# Patient Record
Sex: Male | Born: 1970 | Race: Black or African American | Hispanic: No | Marital: Married | Smoking: Never smoker
Health system: Southern US, Community
[De-identification: ages and names within clinical notes are randomized; demographics above are authoritative.]

## PROBLEM LIST (undated history)

## (undated) DIAGNOSIS — S46119A Strain of muscle, fascia and tendon of long head of biceps, unspecified arm, initial encounter: Secondary | ICD-10-CM

## (undated) DIAGNOSIS — R011 Cardiac murmur, unspecified: Secondary | ICD-10-CM

## (undated) DIAGNOSIS — J45909 Unspecified asthma, uncomplicated: Secondary | ICD-10-CM

## (undated) DIAGNOSIS — M1990 Unspecified osteoarthritis, unspecified site: Secondary | ICD-10-CM

## (undated) HISTORY — DX: Unspecified asthma, uncomplicated: J45.909

## (undated) HISTORY — PX: ROTATOR CUFF REPAIR: SHX139

## (undated) HISTORY — PX: ORIF FINGER / THUMB FRACTURE: SUR932

## (undated) HISTORY — PX: RHINOPLASTY: SUR1284

## (undated) HISTORY — PX: HERNIA REPAIR: SHX51

## (undated) HISTORY — PX: LEG SURGERY: SHX1003

## (undated) HISTORY — PX: OTHER SURGICAL HISTORY: SHX169

## (undated) HISTORY — DX: Unspecified osteoarthritis, unspecified site: M19.90

## (undated) HISTORY — DX: Cardiac murmur, unspecified: R01.1

---

## 2017-10-24 ENCOUNTER — Ambulatory Visit: Payer: Self-pay | Admitting: Family Medicine

## 2017-11-28 ENCOUNTER — Ambulatory Visit: Payer: Self-pay | Admitting: Family Medicine

## 2017-11-28 ENCOUNTER — Ambulatory Visit (INDEPENDENT_AMBULATORY_CARE_PROVIDER_SITE_OTHER): Payer: Commercial Managed Care - PPO | Admitting: Family Medicine

## 2017-11-28 ENCOUNTER — Encounter: Payer: Self-pay | Admitting: Family Medicine

## 2017-11-28 VITALS — BP 120/78 | HR 68 | Temp 98.1°F | Ht 72.0 in | Wt 179.8 lb

## 2017-11-28 DIAGNOSIS — Z13220 Encounter for screening for lipoid disorders: Secondary | ICD-10-CM | POA: Diagnosis not present

## 2017-11-28 DIAGNOSIS — Z8639 Personal history of other endocrine, nutritional and metabolic disease: Secondary | ICD-10-CM | POA: Diagnosis not present

## 2017-11-28 DIAGNOSIS — Z0000 Encounter for general adult medical examination without abnormal findings: Secondary | ICD-10-CM | POA: Diagnosis not present

## 2017-11-28 DIAGNOSIS — Z125 Encounter for screening for malignant neoplasm of prostate: Secondary | ICD-10-CM | POA: Insufficient documentation

## 2017-11-28 MED ORDER — ALBUTEROL SULFATE HFA 108 (90 BASE) MCG/ACT IN AERS: 2.0000 | INHALATION_SPRAY | Freq: Four times a day (QID) | RESPIRATORY_TRACT | 2 refills | Status: DC | PRN

## 2017-11-28 NOTE — Patient Instructions (Signed)

## 2017-11-28 NOTE — Assessment & Plan Note (Signed)
Well adult Orders Placed This Encounter  Procedures  . Comp Met (CMET)    Standing Status:   Future    Standing Expiration Date:   11/29/2018  . CBC    Standing Status:   Future    Standing Expiration Date:   11/29/2018  . TSH    Standing Status:   Future    Standing Expiration Date:   11/29/2018  . Lipid Profile    Standing Status:   Future    Standing Expiration Date:   11/29/2018  . Testosterone Total,Free,Bio, Males    Standing Status:   Future    Standing Expiration Date:   11/29/2018  Immunizations:  Reports up to date on tetanus Screenings:  Reports up to date on HIV screening.  Lipid panel ordered Anticipatory guidance/Risk factor reduction:  Per AVS

## 2017-11-28 NOTE — Progress Notes (Signed)
Darin Hart - 47 y.o. male MRN [id number]  Date of birth: [birth year]  Subjective Chief Complaint  Patient presents with  . Establish Care    CPE,has eaten breakfast at 730, not fasting    HPI Darin Hart is a 47 y.o. male here today to establish care and for annual CPE.  He recently moved from [place].  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  Also participates in competitive bodybuilding.  Taking numerous supplements including supplement for glucose control (beberine, chromium and cinnamon) as well as a testosterone booster and estrogen blocker.  He denies any current or prior use of anabolic steroids.  He would like to have testosterone levels checked as they have been a little low in the past.  He is not fasting for labs today.    He does also have a history of asthma and needs a refill of albuterol inhaler.  He does not need to use this too often.   Review of Systems  Constitutional: Negative for chills, fever, malaise/fatigue and weight loss.  HENT: Negative for congestion, ear pain and sore throat.   Eyes: Negative for blurred vision, double vision and pain.  Respiratory: Negative for cough and shortness of breath.   Cardiovascular: Negative for chest pain and palpitations.  Gastrointestinal: Negative for abdominal pain, blood in stool, constipation, heartburn and nausea.  Genitourinary: Negative for dysuria and urgency.  Musculoskeletal: Negative for joint pain and myalgias.  Neurological: Negative for dizziness and headaches.  Endo/Heme/Allergies: Does not bruise/bleed easily.  Psychiatric/Behavioral: Negative for depression. The patient is not nervous/anxious and does not have insomnia.     Allergies  Allergen Reactions  . Other     All Tree Nuts  . Penicillins   . Shellfish Allergy     Past Medical History:  Diagnosis Date  . Arthritis   . Asthma   . Heart murmur     History reviewed. No pertinent surgical history.  Social History    Socioeconomic History  . Marital status: Married    Spouse name: Not on file  . Number of children: Not on file  . Years of education: Not on file  . Highest education level: Not on file  Occupational History  . Not on file  Social Needs  . Financial resource strain: Not on file  . Food insecurity:    Worry: Not on file    Inability: Not on file  . Transportation needs:    Medical: Not on file    Non-medical: Not on file  Tobacco Use  . Smoking status: Never Smoker  . Smokeless tobacco: Never Used  Substance and Sexual Activity  . Alcohol use: Not on file  . Drug use: Not on file  . Sexual activity: Not on file  Lifestyle  . Physical activity:    Days per week: Not on file    Minutes per session: Not on file  . Stress: Not on file  Relationships  . Social connections:    Talks on phone: Not on file    Gets together: Not on file    Attends religious service: Not on file    Active member of club or organization: Not on file    Attends meetings of clubs or organizations: Not on file    Relationship status: Not on file  Other Topics Concern  . Not on file  Social History Narrative  . Not on file    History reviewed. No pertinent family history.  Health Maintenance  Topic Date Due  . HIV Screening  09/21/1985  . TETANUS/TDAP  09/21/1989  . INFLUENZA VACCINE  12/22/2017    ----------------------------------------------------------------------------------------------------------------------------------------------------------------------------------------------------------------- Physical Exam BP 120/78 (BP Location: Left Arm, Patient Position: Sitting, Cuff Size: Large)   Pulse 68   Temp 98.1 F (36.7 C) (Oral)   Ht 6' (1.829 m)   Wt 179 lb 12.8 oz (81.6 kg)   SpO2 96%   BMI 24.39 kg/m   Physical Exam  Constitutional: He is oriented to person, place, and time. He appears well-nourished. No distress.  HENT:  Head: Normocephalic and atraumatic.   Mouth/Throat: Oropharynx is clear and moist.  Eyes: No scleral icterus.  Neck: Neck supple. No thyromegaly present.  Cardiovascular: Normal rate, regular rhythm, normal heart sounds and intact distal pulses.  Pulmonary/Chest: Effort normal and breath sounds normal.  Abdominal: Soft. He exhibits no distension. There is no tenderness.  Musculoskeletal: He exhibits no tenderness.  Lymphadenopathy:    He has no cervical adenopathy.  Neurological: He is alert and oriented to person, place, and time.  Skin: Skin is warm and dry. No rash noted.  Psychiatric: He has a normal mood and affect. His behavior is normal.    ------------------------------------------------------------------------------------------------------------------------------------------------------------------------------------------------------------------- Assessment and Plan  Well adult exam Well adult Orders Placed This Encounter  Procedures  . Comp Met (CMET)    Standing Status:   Future    Standing Expiration Date:   11/29/2018  . CBC    Standing Status:   Future    Standing Expiration Date:   11/29/2018  . TSH    Standing Status:   Future    Standing Expiration Date:   11/29/2018  . Lipid Profile    Standing Status:   Future    Standing Expiration Date:   11/29/2018  . Testosterone Total,Free,Bio, Males    Standing Status:   Future    Standing Expiration Date:   11/29/2018  Immunizations:  Reports up to date on tetanus Screenings:  Reports up to date on HIV screening.  Lipid panel ordered Anticipatory guidance/Risk factor reduction:  Per AVS

## 2017-11-29 ENCOUNTER — Other Ambulatory Visit (INDEPENDENT_AMBULATORY_CARE_PROVIDER_SITE_OTHER): Payer: Commercial Managed Care - PPO

## 2017-11-29 DIAGNOSIS — Z13220 Encounter for screening for lipoid disorders: Secondary | ICD-10-CM | POA: Diagnosis not present

## 2017-11-29 DIAGNOSIS — Z8639 Personal history of other endocrine, nutritional and metabolic disease: Secondary | ICD-10-CM

## 2017-11-29 DIAGNOSIS — Z0000 Encounter for general adult medical examination without abnormal findings: Secondary | ICD-10-CM

## 2017-11-29 LAB — COMPREHENSIVE METABOLIC PANEL
ALK PHOS: 44 U/L (ref 39–117)
ALT: 27 U/L (ref 0–53)
AST: 30 U/L (ref 0–37)
Albumin: 4.2 g/dL (ref 3.5–5.2)
BUN: 26 mg/dL — ABNORMAL HIGH (ref 6–23)
CALCIUM: 9.2 mg/dL (ref 8.4–10.5)
CO2: 30 meq/L (ref 19–32)
Chloride: 105 mEq/L (ref 96–112)
Creatinine, Ser: 1.35 mg/dL (ref 0.40–1.50)
GFR: 72.8 mL/min (ref >60.00)
Glucose, Bld: 92 mg/dL (ref 70–99)
Potassium: 4 mEq/L (ref 3.5–5.1)
Sodium: 140 mEq/L (ref 135–145)
TOTAL PROTEIN: 6.9 g/dL (ref 6.0–8.3)
Total Bilirubin: 0.5 mg/dL (ref 0.2–1.2)

## 2017-11-29 LAB — CBC
HCT: 41.4 % (ref 39.0–52.0)
HEMOGLOBIN: 13.9 g/dL (ref 13.0–17.0)
MCHC: 33.5 g/dL (ref 30.0–36.0)
MCV: 91.8 fl (ref 78.0–100.0)
Platelets: 182 10*3/uL (ref 150.0–400.0)
RBC: 4.51 Mil/uL (ref 4.22–5.81)
RDW: 13.3 % (ref 11.5–15.5)
WBC: 5 10*3/uL (ref 4.0–10.5)

## 2017-11-29 LAB — LIPID PANEL
Cholesterol: 132 mg/dL (ref 0–200)
HDL: 49.1 mg/dL (ref >39.00)
LDL Cholesterol: 73 mg/dL (ref 0–99)
NONHDL: 83.1
TRIGLYCERIDES: 50 mg/dL (ref 0.0–149.0)
Total CHOL/HDL Ratio: 3
VLDL: 10 mg/dL (ref 0.0–40.0)

## 2017-11-29 LAB — TSH: TSH: 2.51 u[IU]/mL (ref 0.35–4.50)

## 2017-11-30 LAB — TESTOSTERONE TOTAL,FREE,BIO, MALES
ALBUMIN MSPROF: 4.3 g/dL (ref 3.6–5.1)
SEX HORMONE BINDING: 16 nmol/L (ref 10–50)
Testosterone: 240 ng/dL — ABNORMAL LOW (ref 250–827)

## 2017-12-02 ENCOUNTER — Other Ambulatory Visit: Payer: Self-pay | Admitting: Family Medicine

## 2017-12-02 DIAGNOSIS — E291 Testicular hypofunction: Secondary | ICD-10-CM

## 2017-12-08 ENCOUNTER — Encounter (HOSPITAL_COMMUNITY): Payer: Self-pay | Admitting: Emergency Medicine

## 2017-12-08 ENCOUNTER — Emergency Department (HOSPITAL_COMMUNITY)
Admission: EM | Admit: 2017-12-08 | Discharge: 2017-12-08 | Disposition: A | Discharge status: Home / Self Care | Payer: Commercial Managed Care - PPO | Attending: Emergency Medicine | Admitting: Emergency Medicine

## 2017-12-08 ENCOUNTER — Emergency Department (HOSPITAL_COMMUNITY): Payer: Commercial Managed Care - PPO

## 2017-12-08 DIAGNOSIS — M5386 Other specified dorsopathies, lumbar region: Secondary | ICD-10-CM

## 2017-12-08 DIAGNOSIS — I10 Essential (primary) hypertension: Secondary | ICD-10-CM | POA: Diagnosis not present

## 2017-12-08 DIAGNOSIS — J45909 Unspecified asthma, uncomplicated: Secondary | ICD-10-CM | POA: Diagnosis not present

## 2017-12-08 DIAGNOSIS — Z79899 Other long term (current) drug therapy: Secondary | ICD-10-CM | POA: Insufficient documentation

## 2017-12-08 DIAGNOSIS — M5430 Sciatica, unspecified side: Secondary | ICD-10-CM | POA: Diagnosis not present

## 2017-12-08 DIAGNOSIS — M545 Low back pain: Secondary | ICD-10-CM | POA: Diagnosis present

## 2017-12-08 DIAGNOSIS — M5489 Other dorsalgia: Secondary | ICD-10-CM | POA: Diagnosis not present

## 2017-12-08 DIAGNOSIS — M5135 Other intervertebral disc degeneration, thoracolumbar region: Secondary | ICD-10-CM | POA: Diagnosis not present

## 2017-12-08 DIAGNOSIS — R531 Weakness: Secondary | ICD-10-CM | POA: Diagnosis not present

## 2017-12-08 DIAGNOSIS — M5442 Lumbago with sciatica, left side: Secondary | ICD-10-CM | POA: Diagnosis not present

## 2017-12-08 MED ORDER — DIAZEPAM 5 MG/ML IJ SOLN
5.0000 mg | Freq: Once | INTRAMUSCULAR | Status: AC
  Administered 2017-12-08: 5 mg via INTRAVENOUS
  Filled 2017-12-08: qty 2

## 2017-12-08 MED ORDER — KETOROLAC TROMETHAMINE 30 MG/ML IJ SOLN
30.0000 mg | Freq: Once | INTRAMUSCULAR | Status: AC
  Administered 2017-12-08: 30 mg via INTRAVENOUS
  Filled 2017-12-08: qty 1

## 2017-12-08 MED ORDER — PREDNISONE 10 MG (21) PO TBPK: ORAL_TABLET | Freq: Every day | ORAL | 0 refills | Status: DC

## 2017-12-08 MED ORDER — PREDNISONE 20 MG PO TABS
60.0000 mg | ORAL_TABLET | Freq: Once | ORAL | Status: AC
  Administered 2017-12-08: 60 mg via ORAL
  Filled 2017-12-08: qty 3

## 2017-12-08 MED ORDER — HYDROMORPHONE HCL 1 MG/ML IJ SOLN
0.5000 mg | Freq: Once | INTRAMUSCULAR | Status: AC
  Administered 2017-12-08: 0.5 mg via INTRAVENOUS
  Filled 2017-12-08: qty 1

## 2017-12-08 MED ORDER — DIAZEPAM 5 MG PO TABS: ORAL_TABLET | ORAL | 0 refills | Status: DC

## 2017-12-08 NOTE — ED Provider Notes (Signed)
[facility] Note   CSN: [id number] Arrival date & time: 12/08/17  1026     History   Chief Complaint Chief Complaint  Patient presents with  . Back Pain    HPI Darin Hart is a 47 y.o. male history of pneumothorax and low back injury 6 years ago in [place] who presents to the emergency department by EMS with a chief complaint of back pain.  The patient endorses severe, constant midline low back pain that began yesterday while the patient was at the gym.  He was wearing a weight belt and went to place a 35 pound bar back on the shelf when the pain began suddenly.  He states " both of my legs went dead".  He reports that he was unable to feel either of his legs or walk for several minutes.  He eventually began to regain sensation in the right leg, but reports that the left leg felt numb until late last night.  He was seen by neurosurgery and [place] approximately 6 years ago for a similar injury.  He reports that he had several MRIs performed, but they ultimately chose to manage the patient nonoperatively.  He is unsure of his diagnosis at that time.  Pain is worse with standing or walking, which the patient has barely been able to do since the injury.  Pain was minimally improved with laying on his side.  He denies a fever, chills, urinary or fecal incontinence, dysuria, hematuria, penile or testicular pain or swelling, abdominal pain, chest pain, or dyspnea.  He treated his symptoms with 800 mg of ibuprofen last night and slept with a blanket between his legs on his side.  No improvement with treatment.   Yesterday at the gym putting a barbell back on a bench.35 lbs wears a weight belt legs would numb couldn't stand or lift legs for some time left leg stayed "dead" until yesterday evening. Simliar 6 years ago when he was living in [place].  The history is provided by the patient. No language interpreter was used.    Past Medical  History:  Diagnosis Date  . Arthritis   . Asthma   . Heart murmur     Patient Active Problem List   Diagnosis Date Noted  . Well adult exam 11/28/2017    History reviewed. No pertinent surgical history.      Home Medications    Prior to Admission medications   Medication Sig Start Date End Date Taking? Authorizing Provider  ibuprofen (ADVIL,MOTRIN) 200 MG tablet Take 200 mg by mouth every 6 (six) hours as needed for moderate pain.   Yes [provider]  Multiple Vitamin (MULTIVITAMIN WITH MINERALS) TABS tablet Take 1 tablet by mouth daily.   Yes [provider]  Omega-3 Fatty Acids (FISH OIL BURP-LESS PO) Take 2 capsules by mouth daily.   Yes [provider]  albuterol (PROVENTIL HFA;VENTOLIN HFA) 108 (90 Base) MCG/ACT inhaler Inhale 2 puffs into the lungs every 6 (six) hours as needed for wheezing or shortness of breath. 11/28/17   Everrett CoombeMatthews, Cody, DO  diazepam (VALIUM) 5 MG tablet Take 1 to 2 tablets 3 or 4 times daily 12/08/17   Trason Shifflet A, PA-C  predniSONE (STERAPRED UNI-PAK 21 TAB) 10 MG (21) TBPK tablet Take by mouth daily. Take 6 tabs by mouth daily  for 2 days, then 5 tabs for 2 days, then 4 tabs for 2 days, then 3 tabs for 2 days,  2 tabs for 2 days, then 1 tab by mouth daily for 2 days 12/08/17   Barkley Boards, PA-C    Family History No family history on file.  Social History Social History   Tobacco Use  . Smoking status: Never Smoker  . Smokeless tobacco: Never Used  Substance Use Topics  . Alcohol use: Not on file  . Drug use: Not on file     Allergies   Shellfish allergy; Other; and Penicillins   Review of Systems Review of Systems  Constitutional: Negative for appetite change, chills and fever.  Respiratory: Negative for shortness of breath.   Cardiovascular: Negative for chest pain.  Gastrointestinal: Negative for abdominal pain, diarrhea, nausea and vomiting.  Genitourinary: Negative for dysuria, flank pain, frequency,  hematuria and urgency.  Musculoskeletal: Positive for arthralgias, back pain, gait problem and myalgias. Negative for joint swelling.  Skin: Negative for rash.  Allergic/Immunologic: Negative for immunocompromised state.  Neurological: Positive for weakness and numbness. Negative for headaches.  Psychiatric/Behavioral: Negative for confusion.     Physical Exam Updated Vital Signs BP (!) 139/95 (BP Location: Left Arm)   Pulse 71   Temp 98.1 F (36.7 C) (Oral)   Resp 16   SpO2 98%   Physical Exam  Constitutional: He appears well-developed.  HENT:  Head: Normocephalic.  Eyes: Conjunctivae are normal.  Neck: Neck supple.  Cardiovascular: Normal rate, regular rhythm, normal heart sounds and intact distal pulses. Exam reveals no gallop and no friction rub.  No murmur heard. Pulmonary/Chest: Effort normal and breath sounds normal. No stridor. No respiratory distress. He has no wheezes. He has no rales. He exhibits no tenderness.  Abdominal: Soft. Bowel sounds are normal. He exhibits no distension and no mass. There is no tenderness. There is no rebound and no guarding. No hernia.  Musculoskeletal: He exhibits tenderness. He exhibits no edema or deformity.  No tenderness to the cervical or thoracic spinous processes or bilateral paraspinal muscles.  He is tender to palpation to the spinous processes of the lumbar spine and left-sided paraspinal muscles.  No palpable muscle spasms.  Sensation is slightly decreased on the left as compared to the right.  5 out of 5 strength against resistance of the bilateral lower extremities.  Ambulation weightbearing as deferred at this time as the patient looks extremely uncomfortable with positional changes.  Neurological: He is alert.  Skin: Skin is warm and dry.  Psychiatric: His behavior is normal.  Nursing note and vitals reviewed.    ED Treatments / Results  Labs (all labs ordered are listed, but only abnormal results are displayed) Labs  Reviewed - No data to display  EKG None  Radiology Dg Lumbar Spine Complete  Result Date: 12/08/2017 CLINICAL DATA:  Low back pain after lifting injury EXAM: LUMBAR SPINE - COMPLETE 4+ VIEW COMPARISON:  None. FINDINGS: There is no evidence of lumbar spine fracture. Alignment is normal. Intervertebral disc spaces are maintained. IMPRESSION: Negative. Electronically Signed   By: Charlett Nose M.D.   On: 12/08/2017 15:00    Procedures Procedures (including critical care time)  Medications Ordered in ED Medications  HYDROmorphone (DILAUDID) injection 0.5 mg (0.5 mg Intravenous Given 12/08/17 1259)  diazepam (VALIUM) injection 5 mg (5 mg Intravenous Given 12/08/17 1258)  predniSONE (DELTASONE) tablet 60 mg (60 mg Oral Given 12/08/17 1634)  ketorolac (TORADOL) 30 MG/ML injection 30 mg (30 mg Intravenous Given 12/08/17 1634)  diazepam (VALIUM) injection 5 mg (5 mg Intravenous Given 12/08/17 1635)  Initial Impression / Assessment and Plan / ED Course  I have reviewed the triage vital signs and the nursing notes.  Pertinent labs & imaging results that were available during my care of the patient were reviewed by me and considered in my medical decision making (see chart for details).  47 year old male with history of pneumothorax and previous low back injury presenting to the emergency department by EMS with a chief complaint of severe low back pain that radiates down the left leg.  No focal neurologic deficits on exam.  He appears extremely uncomfortable with positional changes.  0.5 mg of Dilaudid and 5 mg of Valium and given prior to x-ray of the lumbar spine.  X-ray of the lumbar spine with no acute fractures or other pathology.  Suspect sciatica as the pain radiates down the left leg.   Clinical Course as of Dec 08 1840  Thu Dec 08, 2017  1615 Patient reevaluation.  The patient is able to go from a sitting to a standing position with minimal assistance.  He still appears significantly  uncomfortable and very stiff.  Antalgic gait.  Will order first dose of prednisone, Valium, and Toradol and re-evaluate prior to discharge.   [MM]    Clinical Course User Index [MM] Vernie Piet A, PA-C    On reevaluation, the patient is feeling much better.  Will discharge the patient to home with prednisone taper, Valium for severe muscle spasms, and follow-up with neurosurgery.  Strict return precautions given.  He is hemodynamically stable and in no acute distress.  He is safe for discharge to home with outpatient follow-up at this time.  Final Clinical Impressions(s) / ED Diagnoses   Final diagnoses:  Sciatica associated with disorder of lumbar spine    ED Discharge Orders        Ordered    diazepam (VALIUM) 5 MG tablet     12/08/17 1716    predniSONE (STERAPRED UNI-PAK 21 TAB) 10 MG (21) TBPK tablet  Daily     12/08/17 1716       Frederik Pear A, PA-C 12/08/17 1842    Azalia Bilis, MD 12/09/17 [id number]

## 2017-12-08 NOTE — Discharge Instructions (Addendum)
Thank you for allowing me to care for you today in the Emergency Department.   Take 1 to 2 tablets of Valium 3-4 times daily.  This is a benzodiazepine and can make you drowsy so do not work or drive while taking this medication.  Starting tomorrow, Take 6 tabs of Prednisone by mouth daily  for 2 days, then 5 tabs for 2 days, then 4 tabs for 2 days, then 3 tabs for 2 days, 2 tabs for 2 days, then 1 tab by mouth daily for 2 days  You can also take 800 mg of ibuprofen with food or thousand milligrams of Tylenol every 8 hours for pain control.  You can also alternate between these 2 medications every 4 hours to help with pain and swelling.  Apply ice or heat for 15 to 20 minutes up to 3-4 times a day.  Start to stretch as your pain allows.  Return to the emergency department if you pee or poop on yourself, have new numbness or weakness, high fever, new fall or injury, or other new concerning symptoms.  You can follow-up with neurosurgery or primary care if your symptoms do not start to improve in the next 5 to 7 days.

## 2017-12-08 NOTE — ED Triage Notes (Signed)
Per [facility] pt from home for back pain that has been going on for over a day. Pt took ibuprofen 800mg  last night.

## 2017-12-12 ENCOUNTER — Other Ambulatory Visit: Payer: Commercial Managed Care - PPO

## 2017-12-13 ENCOUNTER — Other Ambulatory Visit: Payer: Self-pay

## 2017-12-13 ENCOUNTER — Inpatient Hospital Stay: Payer: Commercial Managed Care - PPO | Admitting: Family Medicine

## 2017-12-16 ENCOUNTER — Ambulatory Visit (INDEPENDENT_AMBULATORY_CARE_PROVIDER_SITE_OTHER): Payer: Commercial Managed Care - PPO | Admitting: Family Medicine

## 2017-12-16 ENCOUNTER — Encounter: Payer: Self-pay | Admitting: Family Medicine

## 2017-12-16 VITALS — BP 112/70 | HR 76 | Temp 97.9°F | Ht 72.0 in | Wt 181.4 lb

## 2017-12-16 DIAGNOSIS — E291 Testicular hypofunction: Secondary | ICD-10-CM

## 2017-12-16 DIAGNOSIS — M5442 Lumbago with sciatica, left side: Secondary | ICD-10-CM

## 2017-12-16 DIAGNOSIS — M5441 Lumbago with sciatica, right side: Secondary | ICD-10-CM

## 2017-12-16 LAB — LUTEINIZING HORMONE: LH: 8.51 m[IU]/mL (ref 1.50–9.30)

## 2017-12-16 LAB — FOLLICLE STIMULATING HORMONE: FSH: 7.1 m[IU]/mL (ref 1.4–18.1)

## 2017-12-16 LAB — TESTOSTERONE: TESTOSTERONE: 232.09 ng/dL — AB (ref 300.00–890.00)

## 2017-12-16 MED ORDER — CYCLOBENZAPRINE HCL 10 MG PO TABS: 10.0000 mg | ORAL_TABLET | Freq: Three times a day (TID) | ORAL | 0 refills | Status: DC | PRN

## 2017-12-16 NOTE — Progress Notes (Signed)
Darin LeschChristopher E Skarzynski - 47 y.o. male MRN [id number]  Date of birth: 01/10/1971  Subjective Chief Complaint  Patient presents with  . Follow-up    sore back and tight, trying to walk now that he can move.    HPI Darin Hart is a 47 y.o. male here today for follow up of ED visit.  Was seen on 12/08/2017 with complaint of low back pain with radiation into legs. Started while working out at [employer] gym.   Had weakness in legs associated with this as well that lasted several minutes but had improved by the time he reached the ED.  He has history of similar symptoms and was seen by Neurosurgery while living in [place] previously.  He had MRI completed around 6 years ago but decided to manage conservatively.  He is unsure what previous MRI showed.  He was prescribed valium and medrol dosepak.  He has not quite completed steroids and is almost out of valium.  Xray in ED was unremarkable.  He has regained some ROM although still feels stiff.  He denies continued weakness or numbness into his legs.  He is trying to walk more which seems to be helpful and is avoiding exercises that put strain on his lower back.    ROS:  A comprehensive ROS was completed and negative except as noted per HPI  Allergies  Allergen Reactions  . Shellfish Allergy Anaphylaxis  . Other Other (See Comments)    Migraines, itchy tongue, burning mouth All Tree Nuts  . Penicillins     Pt was told he could not take this, his reaction is unknown Has patient had a PCN reaction causing immediate rash, facial/tongue/throat swelling, SOB or lightheadedness with hypotension: Unknown Has patient had a PCN reaction causing severe rash involving mucus membranes or skin necrosis: Unknown Has patient had a PCN reaction that required hospitalization: NO Has patient had a PCN reaction occurring within the last 10 years: NO If all of the above answers are "NO", then may proceed with Cephalospo    Past Medical History:  Diagnosis Date  .  Arthritis   . Asthma   . Heart murmur     No past surgical history on file.  Social History   Socioeconomic History  . Marital status: Married    Spouse name: Not on file  . Number of children: Not on file  . Years of education: Not on file  . Highest education level: Not on file  Occupational History  . Not on file  Social Needs  . Financial resource strain: Not on file  . Food insecurity:    Worry: Not on file    Inability: Not on file  . Transportation needs:    Medical: Not on file    Non-medical: Not on file  Tobacco Use  . Smoking status: Never Smoker  . Smokeless tobacco: Never Used  Substance and Sexual Activity  . Alcohol use: Not on file  . Drug use: Not on file  . Sexual activity: Not on file  Lifestyle  . Physical activity:    Days per week: Not on file    Minutes per session: Not on file  . Stress: Not on file  Relationships  . Social connections:    Talks on phone: Not on file    Gets together: Not on file    Attends religious service: Not on file    Active member of club or organization: Not on file    Attends meetings  of clubs or organizations: Not on file    Relationship status: Not on file  Other Topics Concern  . Not on file  Social History Narrative  . Not on file    No family history on file.  Health Maintenance  Topic Date Due  . HIV Screening  09/21/1985  . TETANUS/TDAP  09/21/1989  . INFLUENZA VACCINE  12/22/2017    ----------------------------------------------------------------------------------------------------------------------------------------------------------------------------------------------------------------- Physical Exam BP 112/70 (BP Location: Left Arm, Patient Position: Sitting, Cuff Size: Large)   Pulse 76   Temp 97.9 F (36.6 C) (Oral)   Ht 6' (1.829 m)   Wt 181 lb 6.4 oz (82.3 kg)   SpO2 99%   BMI 24.60 kg/m   Physical Exam  Constitutional: He is oriented to person, place, and time. He appears  well-nourished. No distress.  HENT:  Head: Normocephalic and atraumatic.  Mouth/Throat: Oropharynx is clear and moist.  Eyes: No scleral icterus.  Cardiovascular: Normal rate, regular rhythm and normal heart sounds.  Pulmonary/Chest: Effort normal and breath sounds normal.  Musculoskeletal:  L spine without step off palpated.  ROM is fairly good although he is a bit stiff.  Mild bilateral paraspinal spasm on along lumbar area.  Strength and sensation are normal.   Neurological: He is alert and oriented to person, place, and time.  Skin: Skin is warm and dry.  Psychiatric: He has a normal mood and affect. His behavior is normal.    ------------------------------------------------------------------------------------------------------------------------------------------------------------------------------------------------------------------- Assessment and Plan  Acute bilateral low back pain with bilateral sciatica -Sciatica symptoms have improved, still with stiffness in lower back -Will transition from valium to flexeril for spasm, new rx sent in.  -Complete medrol dosepak -May use ibuprofen as needed  -Continue icing/heat -Limit any heavy lifting for now.   -If not continuing to improve over the next 2-3 weeks may need to repeat MRI.

## 2017-12-16 NOTE — Assessment & Plan Note (Signed)
-  Sciatica symptoms have improved, still with stiffness in lower back -Will transition from valium to flexeril for spasm, new rx sent in.  -Complete medrol dosepak -May use ibuprofen as needed  -Continue icing/heat -Limit any heavy lifting for now.   -If not continuing to improve over the next 2-3 weeks may need to repeat MRI.

## 2017-12-16 NOTE — Patient Instructions (Signed)

## 2017-12-19 ENCOUNTER — Other Ambulatory Visit: Payer: Commercial Managed Care - PPO

## 2017-12-20 ENCOUNTER — Other Ambulatory Visit: Payer: Self-pay | Admitting: Family Medicine

## 2017-12-20 DIAGNOSIS — E291 Testicular hypofunction: Secondary | ICD-10-CM

## 2017-12-21 ENCOUNTER — Telehealth: Payer: Self-pay | Admitting: Family Medicine

## 2017-12-21 NOTE — Telephone Encounter (Signed)
Copied from CRM [phone]#[id number]. Topic: General - Other >> Dec 21, 2017  1:27 PM Stephannie LiSimmons, Aqeel Norgaard L, NT wrote: Reason for CRM: Patient called and needs a  work note with that says  no limitations and his return to work date is 12/28/17, on Friday 12/23/17 his wife Shawnie Ponsngela Kinzler has an appointment that day and he would like for her to pick up the letter during her visit , please advise [phone]

## 2017-12-21 NOTE — Telephone Encounter (Signed)
Yes, please.  May return to work 12/28/17 with no restrictions.

## 2017-12-21 NOTE — Telephone Encounter (Signed)
I wrote letter and left up front for pickup. Tried to inform pt but phone was not accepting calls , will try again.

## 2017-12-21 NOTE — Telephone Encounter (Signed)
Please advise, would you like me to write up a work note?

## 2018-01-30 ENCOUNTER — Encounter: Payer: Self-pay | Admitting: Family Medicine

## 2018-01-30 ENCOUNTER — Ambulatory Visit (INDEPENDENT_AMBULATORY_CARE_PROVIDER_SITE_OTHER): Payer: Commercial Managed Care - PPO | Admitting: Family Medicine

## 2018-01-30 DIAGNOSIS — M25511 Pain in right shoulder: Secondary | ICD-10-CM | POA: Diagnosis not present

## 2018-01-30 NOTE — Patient Instructions (Signed)
Rest the shoulder for the next 48 hours, then you may begin light activity.   Rotator Cuff Tendinitis Rotator cuff tendinitis is inflammation of the tough, cord-like bands that connect muscle to bone (tendons) in the rotator cuff. The rotator cuff includes all of the muscles and tendons that connect the arm to the shoulder. The rotator cuff holds the head of the upper arm bone (humerus) in the cup (fossa) of the shoulder blade (scapula). This condition can lead to a long-lasting (chronic) tear. The tear may be partial or complete. What are the causes? This condition is usually caused by overusing the rotator cuff. What increases the risk? This condition is more likely to develop in athletes and workers who frequently use their shoulder or reach over their heads. This can include activities such as:  Tennis.  Baseball or softball.  Swimming.  Construction work.  Painting.  What are the signs or symptoms? Symptoms of this condition include:  Pain spreading (radiating) from the shoulder to the upper arm.  Swelling and tenderness in front of the shoulder.  Pain when reaching, pulling, or lifting the arm above the head.  Pain when lowering the arm from above the head.  Minor pain in the shoulder when resting.  Increased pain in the shoulder at night.  Difficulty placing the arm behind the back.  How is this diagnosed? This condition is diagnosed with a medical history and physical exam. Tests may also be done, including:  X-rays.  MRI.  Ultrasounds.  CT or MR arthrogram. During this test, a contrast material is injected and then images are taken.  How is this treated? Treatment for this condition depends on the severity of the condition. In less severe cases, treatment may include:  Rest. This may be done with a sling that holds the shoulder still (immobilization). Your health care provider may also recommend avoiding activities that involve lifting your arm over your  head.  Icing the shoulder.  Anti-inflammatory medicines, such as aspirin or ibuprofen.  In more severe cases, treatment may include:  Physical therapy.  Steroid injections.  Surgery.  Follow these instructions at home: If you have a sling:  Wear the sling as told by your health care provider. Remove it only as told by your health care provider.  Loosen the sling if your fingers tingle, become numb, or turn cold and blue.  Keep the sling clean.  If the sling is not waterproof, do not let it get wet. Remove it, if allowed, or cover it with a watertight covering when you take a bath or shower. Managing pain, stiffness, and swelling  If directed, put ice on the injured area. ? If you have a removable sling, remove it as told by your health care provider. ? Put ice in a plastic bag. ? Place a towel between your skin and the bag. ? Leave the ice on for 20 minutes, 2-3 times a day.  Move your fingers often to avoid stiffness and to lessen swelling.  Raise (elevate) the injured area above the level of your heart while you are lying down.  Find a comfortable sleeping position or sleep on a recliner, if available. Driving  Do not drive or use heavy machinery while taking prescription pain medicine.  Ask your health care provider when it is safe to drive if you have a sling on your arm. Activity  Rest your shoulder as told by your health care provider.  Return to your normal activities as told by your health  care provider. Ask your health care provider what activities are safe for you.  Do any exercises or stretches as told by your health care provider.  If you do repetitive overhead tasks, take small breaks in between and include stretching exercises as told by your health care provider. General instructions  Do not use any products that contain nicotine or tobacco, such as cigarettes and e-cigarettes. These can delay healing. If you need help quitting, ask your health care  provider.  Take over-the-counter and prescription medicines only as told by your health care provider.  Keep all follow-up visits as told by your health care provider. This is important. Contact a health care provider if:  Your pain gets worse.  You have new pain in your arm, hands, or fingers.  Your pain is not relieved with medicine or does not get better after 6 weeks of treatment.  You have cracking sensations when moving your shoulder in certain directions.  You hear a snapping sound after using your shoulder, followed by severe pain and weakness. Get help right away if:  Your arm, hand, or fingers are numb or tingling.  Your arm, hand, or fingers are swollen or painful or they turn white or blue. Summary  Rotator cuff tendinitis is inflammation of the tough, cord-like bands that connect muscle to bone (tendons) in the rotator cuff.  This condition is usually caused by overusing the rotator cuff, which includes all of the muscles and tendons that connect the arm to the shoulder.  This condition is more likely to develop in athletes and workers who frequently use their shoulder or reach over their heads.  Treatment generally includes rest, anti-inflammatory medicines, and icing. In some cases, physical therapy and steroid injections may be needed. In severe cases, surgery may be needed. This information is not intended to replace advice given to you by your health care provider. Make sure you discuss any questions you have with your health care provider. Document Released: 07/31/2003 Document Revised: 04/26/2016 Document Reviewed: 04/26/2016 Elsevier Interactive Patient Education  2017 [employer].

## 2018-01-30 NOTE — Assessment & Plan Note (Signed)
Discussed tx options today including NSAID, PT or shoulder injection He elected to try shoulder injection today.  See procedure note.  He will let me know if not improving.  Discussed sports med referral if continues.

## 2018-01-30 NOTE — Progress Notes (Signed)
Darin Hart - 47 y.o. male MRN [id number]  Date of birth: [birth year]  Subjective Chief Complaint  Patient presents with  . Shoulder Pain    right shoulder    HPI Darin Hart is here today with complaint of R shoulder pain.  He reports that symptoms began about 4 months ago.  He was hopeful that this would resolve on its own but has persisted.  He does workout frequently and notices at times with working out especially with exercises involving abduction or doing flys while laying down.  Pain will wake him at night sometimes if he rolls over on shoulder.  He denies swelling of the joints, fever, chills, weakness, numbness/tingling or neck pain.  Prescribed steroids for recent back pain and didn't really have improvement of shoulder pain.   ROS:  A comprehensive ROS was completed and negative except as noted per HPI  Allergies  Allergen Reactions  . Shellfish Allergy Anaphylaxis  . Other Other (See Comments)    Migraines, itchy tongue, burning mouth All Tree Nuts  . Penicillins     Pt was told he could not take this, his reaction is unknown Has patient had a PCN reaction causing immediate rash, facial/tongue/throat swelling, SOB or lightheadedness with hypotension: Unknown Has patient had a PCN reaction causing severe rash involving mucus membranes or skin necrosis: Unknown Has patient had a PCN reaction that required hospitalization: NO Has patient had a PCN reaction occurring within the last 10 years: NO If all of the above answers are "NO", then may proceed with Cephalospo    Past Medical History:  Diagnosis Date  . Arthritis   . Asthma   . Heart murmur     No past surgical history on file.  Social History   Socioeconomic History  . Marital status: Married    Spouse name: Not on file  . Number of children: Not on file  . Years of education: Not on file  . Highest education level: Not on file  Occupational History  . Not on file  Social Needs  .  Financial resource strain: Not on file  . Food insecurity:    Worry: Not on file    Inability: Not on file  . Transportation needs:    Medical: Not on file    Non-medical: Not on file  Tobacco Use  . Smoking status: Never Smoker  . Smokeless tobacco: Never Used  Substance and Sexual Activity  . Alcohol use: Not on file  . Drug use: Not on file  . Sexual activity: Not on file  Lifestyle  . Physical activity:    Days per week: Not on file    Minutes per session: Not on file  . Stress: Not on file  Relationships  . Social connections:    Talks on phone: Not on file    Gets together: Not on file    Attends religious service: Not on file    Active member of club or organization: Not on file    Attends meetings of clubs or organizations: Not on file    Relationship status: Not on file  Other Topics Concern  . Not on file  Social History Narrative  . Not on file    No family history on file.  Health Maintenance  Topic Date Due  . HIV Screening  09/21/1985  . TETANUS/TDAP  09/21/1989  . INFLUENZA VACCINE  12/22/2017    ----------------------------------------------------------------------------------------------------------------------------------------------------------------------------------------------------------------- Physical Exam BP 110/80 (BP Location: Left Arm, Patient Position: Sitting, Cuff  Size: Large)   Pulse 63   Temp 98.2 F (36.8 C) (Oral)   Ht 6' (1.829 m)   Wt 180 lb 6.4 oz (81.8 kg)   SpO2 98%   BMI 24.47 kg/m   Physical Exam  Constitutional: He is oriented to person, place, and time. He appears well-nourished. No distress.  HENT:  Head: Normocephalic and atraumatic.  Eyes: No scleral icterus.  Musculoskeletal:  Shoulder normal to inspection and palpation. ROM of R shoulder is fairly good.  Mild pain with abduction and IR.  Mild pain with empty can.  Rotator cuff strength is fairly good with only mild weakness with supraspinatus compared to L    Neurological: He is alert and oriented to person, place, and time.  Skin: Skin is warm and dry. No rash noted.  Psychiatric: He has a normal mood and affect. His behavior is normal.   Procedure:  Procedure discussed with patient and all questions answered.  Allergies reviewed. Consent obtained.  Patient was prepped in typical sterile fashion with betadine.  A cold spray was applied to the skin.  Using a posterior approach the subacromial space was injected with 45mL of 40mg /mL depo-medrol and 68mL of 1% lidocaine.  He tolerated procedure well.  Band aid applied and post -procedure instructions were given.  ------------------------------------------------------------------------------------------------------------------------------------------------------------------------------------------------------------------- Assessment and Plan  Right shoulder pain Discussed tx options today including NSAID, PT or shoulder injection He elected to try shoulder injection today.  See procedure note.  He will let me know if not improving.  Discussed sports med referral if continues.

## 2018-02-07 ENCOUNTER — Ambulatory Visit (INDEPENDENT_AMBULATORY_CARE_PROVIDER_SITE_OTHER): Payer: Commercial Managed Care - PPO | Admitting: Endocrinology

## 2018-02-07 ENCOUNTER — Encounter: Payer: Self-pay | Admitting: Endocrinology

## 2018-02-07 DIAGNOSIS — R7989 Other specified abnormal findings of blood chemistry: Secondary | ICD-10-CM | POA: Insufficient documentation

## 2018-02-07 NOTE — Progress Notes (Signed)
Subjective:    Patient ID: Darin Hart, male    DOB: 1970-12-12, 47 y.o.   MRN: [id number]  HPI Pt is referred by Dr Ashley Royalty, for hypogonadism.  Pt reports he had puberty at the normal age.  He has 2 biological children.  He says he has never taken illicit androgens.  He has never been on any prescribed medication for hypogonadism.  He does not take antiandrogens or opioids.  He denies any h/o infertility, XRT, or genital infection.  He has never had surgery, or a serious injury to the head or genital area.  He has no h/o sleep apnea or DVT.   He does not consume alcohol excessively.  He says he has taken several PO androgen supplements (1 of which contains DHEA and epiandrosterone).   He reports slightly decreased muscle mass, but no assoc weakness. He does bodybuilding Past Medical History:  Diagnosis Date  . Arthritis   . Asthma   . Heart murmur     No past surgical history on file.  Social History   Socioeconomic History  . Marital status: Married    Spouse name: Not on file  . Number of children: Not on file  . Years of education: Not on file  . Highest education level: Not on file  Occupational History  . Not on file  Social Needs  . Financial resource strain: Not on file  . Food insecurity:    Worry: Not on file    Inability: Not on file  . Transportation needs:    Medical: Not on file    Non-medical: Not on file  Tobacco Use  . Smoking status: Never Smoker  . Smokeless tobacco: Never Used  Substance and Sexual Activity  . Alcohol use: Not on file  . Drug use: Not on file  . Sexual activity: Not on file  Lifestyle  . Physical activity:    Days per week: Not on file    Minutes per session: Not on file  . Stress: Not on file  Relationships  . Social connections:    Talks on phone: Not on file    Gets together: Not on file    Attends religious service: Not on file    Active member of club or organization: Not on file    Attends meetings of clubs or  organizations: Not on file    Relationship status: Not on file  . Intimate partner violence:    Fear of current or ex partner: Not on file    Emotionally abused: Not on file    Physically abused: Not on file    Forced sexual activity: Not on file  Other Topics Concern  . Not on file  Social History Narrative  . Not on file    Current Outpatient Medications on File Prior to Visit  Medication Sig Dispense Refill  . albuterol (PROVENTIL HFA;VENTOLIN HFA) 108 (90 Base) MCG/ACT inhaler Inhale 2 puffs into the lungs every 6 (six) hours as needed for wheezing or shortness of breath. 1 Inhaler 2  . ibuprofen (ADVIL,MOTRIN) 200 MG tablet Take 200 mg by mouth every 6 (six) hours as needed for moderate pain.    . Multiple Vitamin (MULTIVITAMIN WITH MINERALS) TABS tablet Take 1 tablet by mouth daily.    . Omega-3 Fatty Acids (FISH OIL BURP-LESS PO) Take 2 capsules by mouth daily.    . cyclobenzaprine (FLEXERIL) 10 MG tablet Take 1 tablet (10 mg total) by mouth 3 (three) times daily as needed  for muscle spasms. (Patient not taking: Reported on 02/07/2018) 45 tablet 0   No current facility-administered medications on file prior to visit.     Allergies  Allergen Reactions  . Shellfish Allergy Anaphylaxis  . Other Other (See Comments)    Migraines, itchy tongue, burning mouth All Tree Nuts  . Penicillins     Pt was told he could not take this, his reaction is unknown Has patient had a PCN reaction causing immediate rash, facial/tongue/throat swelling, SOB or lightheadedness with hypotension: Unknown Has patient had a PCN reaction causing severe rash involving mucus membranes or skin necrosis: Unknown Has patient had a PCN reaction that required hospitalization: NO Has patient had a PCN reaction occurring within the last 10 years: NO If all of the above answers are "NO", then may proceed with Cephalospo    Family History  Problem Relation Age of Onset  . Other Neg Hx        hypogonadism     BP 118/74 (BP Location: Left Arm)   Pulse 65   Ht 6' (1.829 m)   Wt 177 lb (80.3 kg)   SpO2 97%   BMI 24.01 kg/m     Review of Systems denies depression, numbness, erectile dysfunction, weight change, decreased urinary stream, gynecomastia, fever, headache, easy bruising, sob, rash, blurry vision, rhinorrhea, and chest pain.       Objective:   Physical Exam VS: see vs page GEN: no distress HEAD: head: no deformity eyes: no periorbital swelling, no proptosis external nose and ears are normal mouth: no lesion seen NECK: supple, thyroid is not enlarged CHEST WALL: no deformity LUNGS: clear to auscultation BREASTS:  No gynecomastia.  CV: reg rate and rhythm, no murmur ABD: abdomen is soft, nontender.  no hepatosplenomegaly.  not distended.  no hernia GENITALIA:  Normal male.   MUSCULOSKELETAL: muscle bulk and strength are grossly normal.  no obvious joint swelling.  gait is normal and steady EXTEMITIES: no leg edema PULSES: no carotid bruit NEURO:  cn 2-12 grossly intact.   readily moves all 4's.  sensation is intact to touch on all 4's.  SKIN:  Normal texture and temperature.  No rash or suspicious lesion is visible.  Normal hair distribution. NODES:  None palpable at the neck PSYCH: alert, well-oriented.  Does not appear anxious nor depressed.   Lab Results  Component Value Date   TESTOSTERONE 232.09 (L) 12/16/2017   Lab Results  Component Value Date   TSH 2.51 11/29/2017   Lab Results  Component Value Date   WBC 5.0 11/29/2017   HGB 13.9 11/29/2017   HCT 41.4 11/29/2017   MCV 91.8 11/29/2017   PLT 182.0 11/29/2017   I have reviewed outside records, and summarized: Pt was noted to have low testosterone, and referred here.  Main problem recently has been back pain      Assessment & Plan:  Hypogonadism: bodybuilding and use of non-rx products suggest high risk for androgen abuse Decreased muscle mass: not confirmed on exam.    Patient Instructions   blood tests are requested for you today.  We'll let you know about the results. Testosterone treatment has risks, including increased or decreased fertility (depending on the type of treatment), hair loss, prostate cancer, benign prostate enlargement, blood clots, liver problems, lower hdl ("good cholesterol"), polycythemia (opposite of anemia), sleep apnea, and behavior changes.

## 2018-02-07 NOTE — Patient Instructions (Signed)
blood tests are requested for you today.  We'll let you know about the results. Testosterone treatment has risks, including increased or decreased fertility (depending on the type of treatment), hair loss, prostate cancer, benign prostate enlargement, blood clots, liver problems, lower hdl ("good cholesterol"), polycythemia (opposite of anemia), sleep apnea, and behavior changes.  

## 2018-02-08 LAB — PROLACTIN: PROLACTIN: 8.9 ng/mL (ref 2.0–18.0)

## 2018-02-13 LAB — TESTOSTERONE,FREE AND TOTAL
TESTOSTERONE: 409 ng/dL (ref 264–916)
Testosterone, Free: 7.2 pg/mL (ref 6.8–21.5)

## 2018-04-05 ENCOUNTER — Other Ambulatory Visit: Payer: Self-pay | Admitting: Family Medicine

## 2018-04-14 IMAGING — CR DG LUMBAR SPINE COMPLETE 4+V
5 series · 5 of 5 positions shown · non-contrast
Comparison: None.

CLINICAL DATA: Low back pain after lifting injury

EXAM:
LUMBAR SPINE - COMPLETE 4+ VIEW

[t lumbar spine ap]
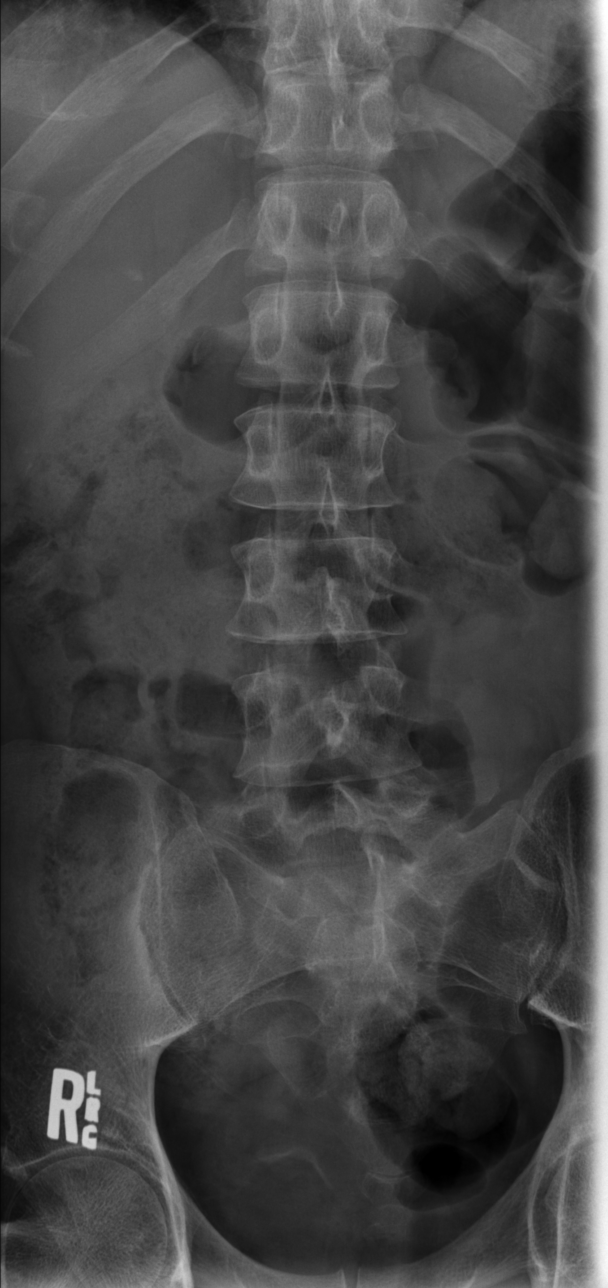

[t lumbar spine obl (1 of 2)]
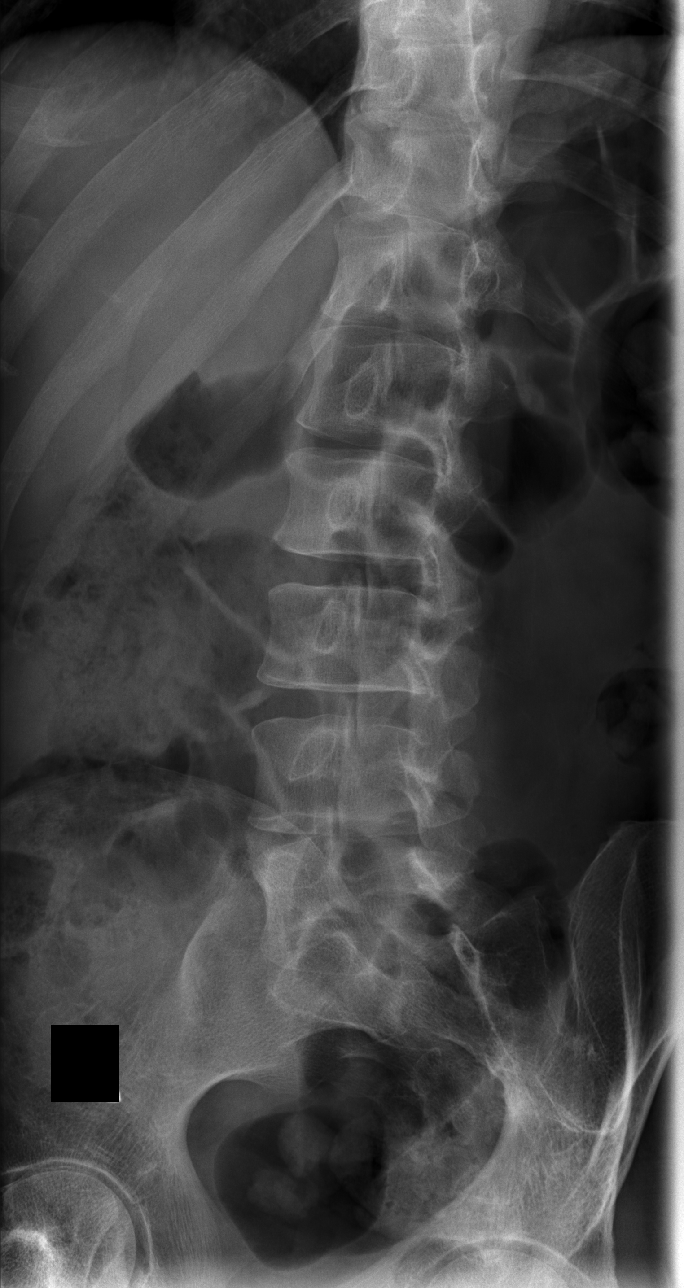

[t lumbar spine obl (2 of 2)]
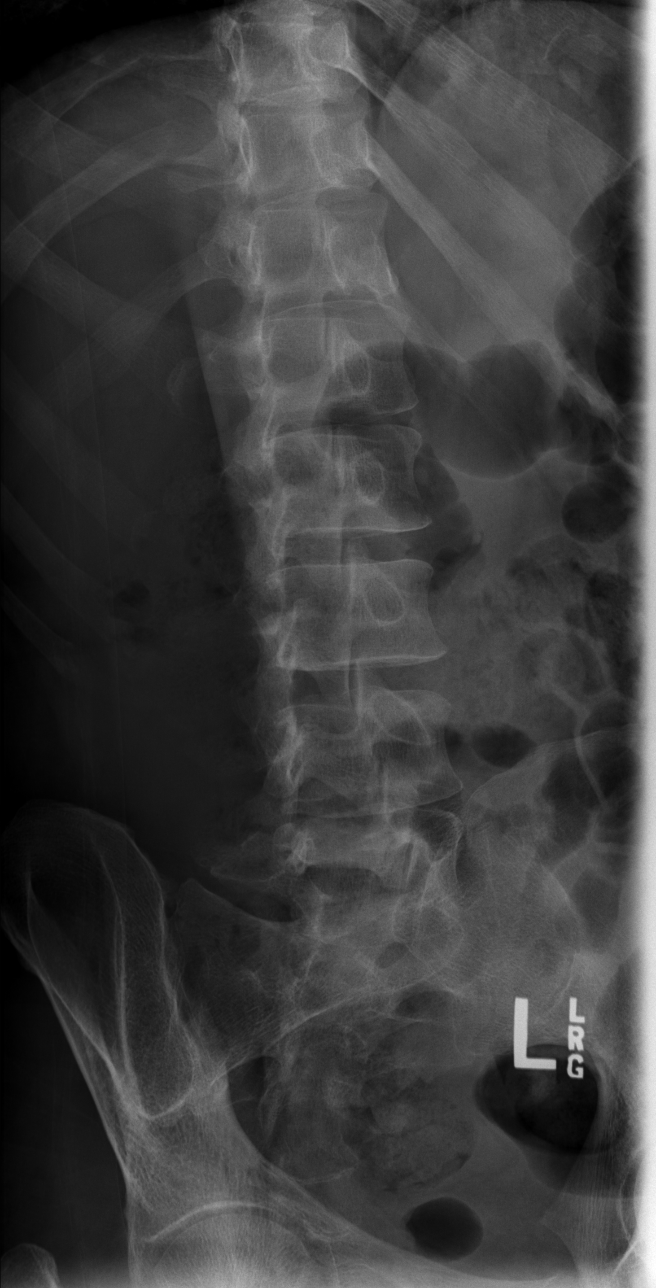

[t lumbar spine lat]
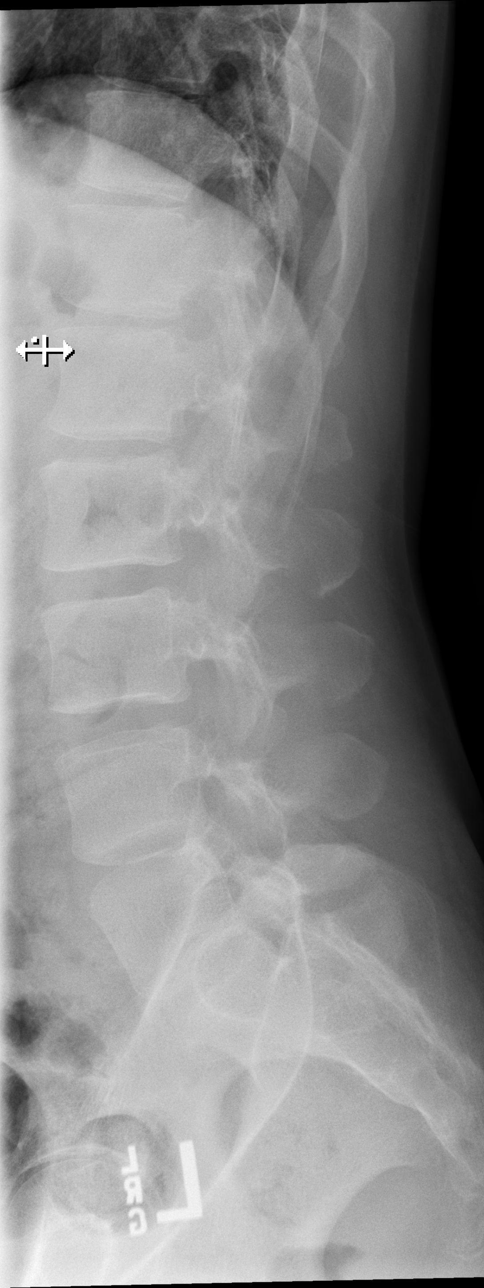

[t lumbar l-5 s-1 spot]
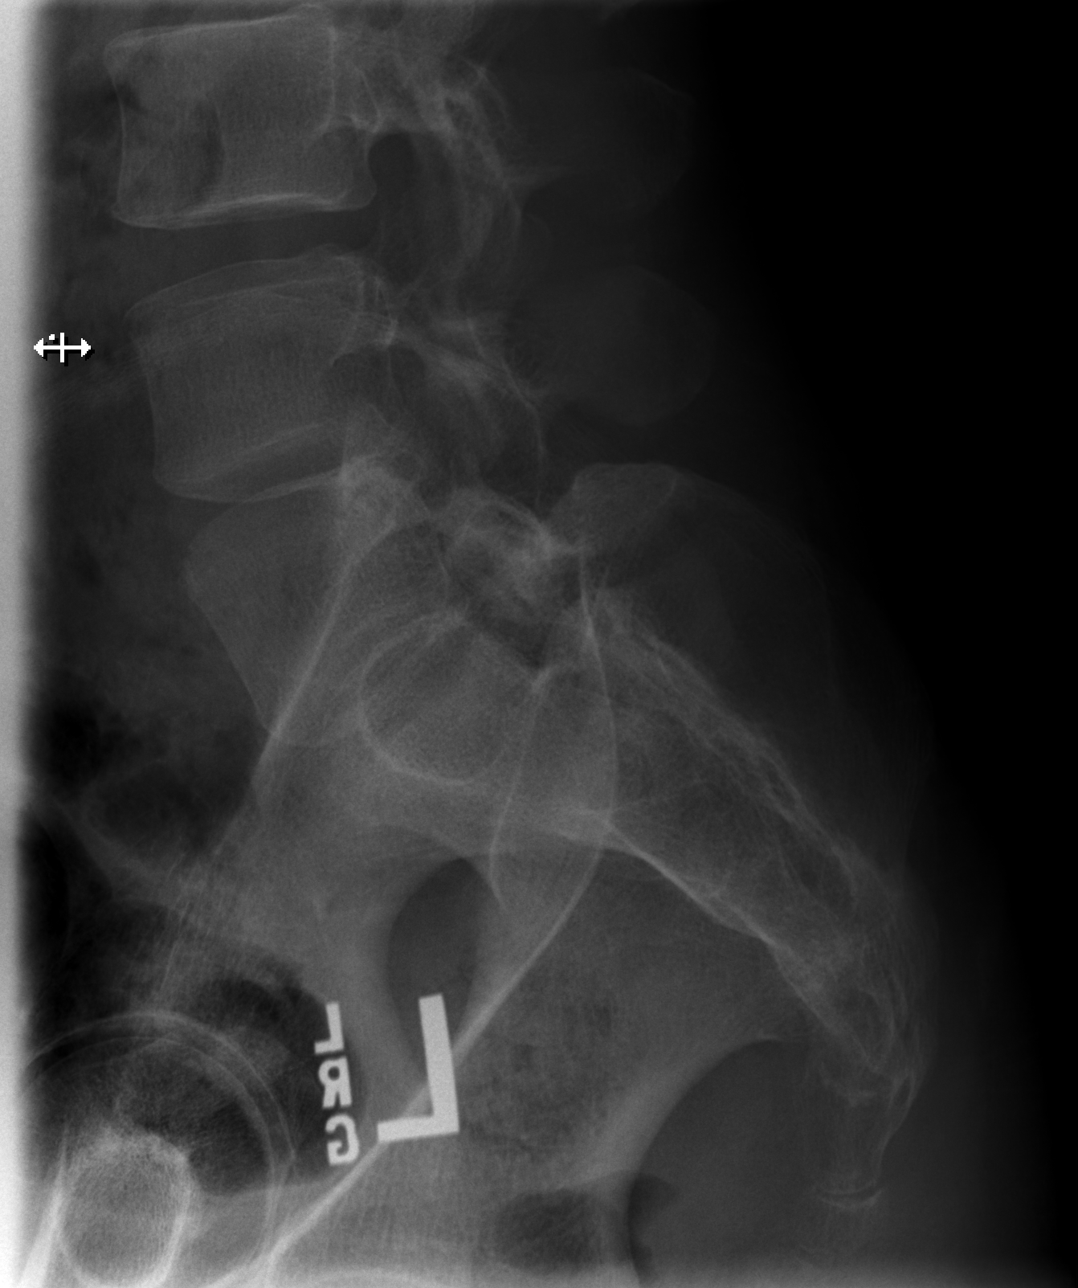

[5 of 5 positions shown; findings below may reference images not displayed]

FINDINGS: There is no evidence of lumbar spine fracture. Alignment is normal.
Intervertebral disc spaces are maintained.
IMPRESSION: Negative.

## 2018-09-02 ENCOUNTER — Telehealth: Payer: Self-pay

## 2018-09-02 NOTE — Telephone Encounter (Signed)
Copied from CRM [id number]. Topic: General - Inquiry >> Sep 01, 2018  9:24 AM Maia Petties wrote: Reason for CRM: pt asking for letter for his employer to be out of work for a couple of weeks due to pollen/allergies and asthma. He said the only that works for him is benadryl and makes him sleepy. Pt is concerned about getting COVID19 due to using public transportation to get around. He is requesting at least 3 weeks, until the clear of COVID19/pollen/allergies. He requests note to be out until Monday May 11. Please advise.

## 2018-09-04 NOTE — Telephone Encounter (Signed)
Sent MyChart message. Pt is aware .

## 2018-09-04 NOTE — Telephone Encounter (Signed)
Unfortunately I can not provide a letter writing him out for that period of time based on current symptoms.  He is also not considered to be in the high risk group for COVID-19.  I would recommend that he use general precautions such as wearing a mask if in public and washing/sanitizing hands frequently.

## 2018-09-04 NOTE — Telephone Encounter (Signed)
Pt called [facility], RCB to Pt. LAM Waiting on RCB to answer is questions/assist him with MyChart.

## 2018-12-01 ENCOUNTER — Encounter: Payer: Commercial Managed Care - PPO | Admitting: Family Medicine

## 2018-12-04 ENCOUNTER — Other Ambulatory Visit: Payer: Self-pay

## 2018-12-05 ENCOUNTER — Ambulatory Visit (INDEPENDENT_AMBULATORY_CARE_PROVIDER_SITE_OTHER): Payer: Commercial Managed Care - PPO | Admitting: Family Medicine

## 2018-12-05 VITALS — BP 92/62 | HR 63 | Temp 97.6°F | Resp 16 | Ht 70.25 in | Wt 176.0 lb

## 2018-12-05 DIAGNOSIS — Z0000 Encounter for general adult medical examination without abnormal findings: Secondary | ICD-10-CM

## 2018-12-05 DIAGNOSIS — Z13220 Encounter for screening for lipoid disorders: Secondary | ICD-10-CM

## 2018-12-05 LAB — LIPID PANEL
Cholesterol: 155 mg/dL (ref 0–200)
HDL: 51.6 mg/dL (ref >39.00)
LDL Cholesterol: 93 mg/dL (ref 0–99)
NonHDL: 103.17
Total CHOL/HDL Ratio: 3
Triglycerides: 52 mg/dL (ref 0.0–149.0)
VLDL: 10.4 mg/dL (ref 0.0–40.0)

## 2018-12-05 LAB — COMPREHENSIVE METABOLIC PANEL
ALT: 21 U/L (ref 0–53)
AST: 21 U/L (ref 0–37)
Albumin: 4.3 g/dL (ref 3.5–5.2)
Alkaline Phosphatase: 50 U/L (ref 39–117)
BUN: 22 mg/dL (ref 6–23)
CO2: 27 mEq/L (ref 19–32)
Calcium: 9.1 mg/dL (ref 8.4–10.5)
Chloride: 106 mEq/L (ref 96–112)
Creatinine, Ser: 0.99 mg/dL (ref 0.40–1.50)
GFR: 97.54 mL/min (ref >60.00)
Glucose, Bld: 102 mg/dL — ABNORMAL HIGH (ref 70–99)
Potassium: 3.5 mEq/L (ref 3.5–5.1)
Sodium: 141 mEq/L (ref 135–145)
Total Bilirubin: 0.6 mg/dL (ref 0.2–1.2)
Total Protein: 6.7 g/dL (ref 6.0–8.3)

## 2018-12-05 LAB — CBC WITH DIFFERENTIAL/PLATELET
Basophils Absolute: 0 10*3/uL (ref 0.0–0.1)
Basophils Relative: 0.5 % (ref 0.0–3.0)
Eosinophils Absolute: 0.2 10*3/uL (ref 0.0–0.7)
Eosinophils Relative: 5.6 % — ABNORMAL HIGH (ref 0.0–5.0)
HCT: 40.5 % (ref 39.0–52.0)
Hemoglobin: 13.8 g/dL (ref 13.0–17.0)
Lymphocytes Relative: 37 % (ref 12.0–46.0)
Lymphs Abs: 1.3 10*3/uL (ref 0.7–4.0)
MCHC: 34 g/dL (ref 30.0–36.0)
MCV: 91.4 fl (ref 78.0–100.0)
Monocytes Absolute: 0.3 10*3/uL (ref 0.1–1.0)
Monocytes Relative: 8.1 % (ref 3.0–12.0)
Neutro Abs: 1.7 10*3/uL (ref 1.4–7.7)
Neutrophils Relative %: 48.8 % (ref 43.0–77.0)
Platelets: 160 10*3/uL (ref 150.0–400.0)
RBC: 4.43 Mil/uL (ref 4.22–5.81)
RDW: 12.8 % (ref 11.5–15.5)
WBC: 3.4 10*3/uL — ABNORMAL LOW (ref 4.0–10.5)

## 2018-12-05 LAB — TSH: TSH: 0.89 u[IU]/mL (ref 0.35–4.50)

## 2018-12-05 MED ORDER — ALBUTEROL SULFATE HFA 108 (90 BASE) MCG/ACT IN AERS: 2.0000 | INHALATION_SPRAY | Freq: Four times a day (QID) | RESPIRATORY_TRACT | 3 refills | Status: DC | PRN

## 2018-12-05 NOTE — Patient Instructions (Signed)
Preventive Care 40-48 Years Old, Male Preventive care refers to lifestyle choices and visits with your health care provider that can promote health and wellness. This includes:  A yearly physical exam. This is also called an annual well check.  Regular dental and eye exams.  Immunizations.  Screening for certain conditions.  Healthy lifestyle choices, such as eating a healthy diet, getting regular exercise, not using drugs or products that contain nicotine and tobacco, and limiting alcohol use. What can I expect for my preventive care visit? Physical exam Your health care provider will check:  Height and weight. These may be used to calculate body mass index (BMI), which is a measurement that tells if you are at a healthy weight.  Heart rate and blood pressure.  Your skin for abnormal spots. Counseling Your health care provider may ask you questions about:  Alcohol, tobacco, and drug use.  Emotional well-being.  Home and relationship well-being.  Sexual activity.  Eating habits.  Work and work environment. What immunizations do I need?  Influenza (flu) vaccine  This is recommended every year. Tetanus, diphtheria, and pertussis (Tdap) vaccine  You may need a Td booster every 10 years. Varicella (chickenpox) vaccine  You may need this vaccine if you have not already been vaccinated. Zoster (shingles) vaccine  You may need this after age 60. Measles, mumps, and rubella (MMR) vaccine  You may need at least one dose of MMR if you were born in 1957 or later. You may also need a second dose. Pneumococcal conjugate (PCV13) vaccine  You may need this if you have certain conditions and were not previously vaccinated. Pneumococcal polysaccharide (PPSV23) vaccine  You may need one or two doses if you smoke cigarettes or if you have certain conditions. Meningococcal conjugate (MenACWY) vaccine  You may need this if you have certain conditions. Hepatitis A vaccine   You may need this if you have certain conditions or if you travel or work in places where you may be exposed to hepatitis A. Hepatitis B vaccine  You may need this if you have certain conditions or if you travel or work in places where you may be exposed to hepatitis B. Haemophilus influenzae type b (Hib) vaccine  You may need this if you have certain risk factors. Human papillomavirus (HPV) vaccine  If recommended by your health care provider, you may need three doses over 6 months. You may receive vaccines as individual doses or as more than one vaccine together in one shot (combination vaccines). Talk with your health care provider about the risks and benefits of combination vaccines. What tests do I need? Blood tests  Lipid and cholesterol levels. These may be checked every 5 years, or more frequently if you are over 50 years old.  Hepatitis C test.  Hepatitis B test. Screening  Lung cancer screening. You may have this screening every year starting at age 55 if you have a 30-pack-year history of smoking and currently smoke or have quit within the past 15 years.  Prostate cancer screening. Recommendations will vary depending on your family history and other risks.  Colorectal cancer screening. All adults should have this screening starting at age 50 and continuing until age 75. Your health care provider may recommend screening at age 45 if you are at increased risk. You will have tests every 1-10 years, depending on your results and the type of screening test.  Diabetes screening. This is done by checking your blood sugar (glucose) after you have not eaten   for a while (fasting). You may have this done every 1-3 years.  Sexually transmitted disease (STD) testing. Follow these instructions at home: Eating and drinking  Eat a diet that includes fresh fruits and vegetables, whole grains, lean protein, and low-fat dairy products.  Take vitamin and mineral supplements as recommended  by your health care provider.  Do not drink alcohol if your health care provider tells you not to drink.  If you drink alcohol: ? Limit how much you have to 0-2 drinks a day. ? Be aware of how much alcohol is in your drink. In the [place] one drink equals one 12 oz bottle of beer (355 mL), one 5 oz glass of wine (148 mL), or one 1 oz glass of hard liquor (44 mL). Lifestyle  Take daily care of your teeth and gums.  Stay active. Exercise for at least 30 minutes on 5 or more days each week.  Do not use any products that contain nicotine or tobacco, such as cigarettes, e-cigarettes, and chewing tobacco. If you need help quitting, ask your health care provider.  If you are sexually active, practice safe sex. Use a condom or other form of protection to prevent STIs (sexually transmitted infections).  Talk with your health care provider about taking a low-dose aspirin every day starting at age 33. What's next?  Go to your health care provider once a year for a well check visit.  Ask your health care provider how often you should have your eyes and teeth checked.  Stay up to date on all vaccines. This information is not intended to replace advice given to you by your health care provider. Make sure you discuss any questions you have with your health care provider. Document Released: 06/06/2015 Document Revised: 05/04/2018 Document Reviewed: 05/04/2018 Elsevier Patient Education  2020 [employer].

## 2018-12-05 NOTE — Progress Notes (Signed)
Darin Hart - 48 y.o. male MRN [id number]  Date of birth: 1970-08-02  Subjective Chief Complaint  Patient presents with  . Annual Exam    CPE    HPI Darin Hart is a 48 y.o. male with history of asthma here today for annual exam.  Asthma and allergies are fairly well controlled at this time.  Has needed albuterol about 5x this year.  He follows a healthy diet and exercises daily.  He was weight lifting daily but is limited due to gym closure from [employer] restrictions.  He is a nonsmoker and rarely consumes EtOH.  He has no other concerns today.    Review of Systems  Constitutional: Negative for chills, fever, malaise/fatigue and weight loss.  HENT: Negative for congestion, ear pain and sore throat.   Eyes: Negative for blurred vision, double vision and pain.  Respiratory: Negative for cough and shortness of breath.   Cardiovascular: Negative for chest pain and palpitations.  Gastrointestinal: Negative for abdominal pain, blood in stool, constipation, heartburn and nausea.  Genitourinary: Negative for dysuria and urgency.  Musculoskeletal: Negative for joint pain and myalgias.  Neurological: Negative for dizziness and headaches.  Endo/Heme/Allergies: Does not bruise/bleed easily.  Psychiatric/Behavioral: Negative for depression. The patient is not nervous/anxious and does not have insomnia.     Allergies  Allergen Reactions  . Shellfish Allergy Anaphylaxis  . Other Other (See Comments)    Migraines, itchy tongue, burning mouth All Tree Nuts  . Penicillins     Pt was told he could not take this, his reaction is unknown Has patient had a PCN reaction causing immediate rash, facial/tongue/throat swelling, SOB or lightheadedness with hypotension: Unknown Has patient had a PCN reaction causing severe rash involving mucus membranes or skin necrosis: Unknown Has patient had a PCN reaction that required hospitalization: NO Has patient had a PCN reaction occurring within the  last 10 years: NO If all of the above answers are "NO", then may proceed with Cephalospo    Past Medical History:  Diagnosis Date  . Arthritis   . Asthma   . Heart murmur     History reviewed. No pertinent surgical history.  Social History   Socioeconomic History  . Marital status: Married    Spouse name: Not on file  . Number of children: Not on file  . Years of education: Not on file  . Highest education level: Not on file  Occupational History  . Not on file  Social Needs  . Financial resource strain: Not hard at all  . Food insecurity    Worry: Never true    Inability: Never true  . Transportation needs    Medical: No    Non-medical: No  Tobacco Use  . Smoking status: Never Smoker  . Smokeless tobacco: Never Used  Substance and Sexual Activity  . Alcohol use: Yes    Comment: OCCASIONALLY  . Drug use: Never  . Sexual activity: Yes    Partners: Female  Lifestyle  . Physical activity    Days per week: Not on file    Minutes per session: Not on file  . Stress: To some extent  Relationships  . Social connections    Talks on phone: More than three times a week    Gets together: More than three times a week    Attends religious service: Not on file    Active member of club or organization: Not on file    Attends meetings of clubs or organizations:  Not on file    Relationship status: Married  Other Topics Concern  . Not on file  Social History Narrative  . Not on file    Family History  Problem Relation Age of Onset  . Other Neg Hx        hypogonadism    Health Maintenance  Topic Date Due  . HIV Screening  09/21/1985  . TETANUS/TDAP  09/21/1989  . INFLUENZA VACCINE  12/23/2018    ----------------------------------------------------------------------------------------------------------------------------------------------------------------------------------------------------------------- Physical Exam BP 92/62   Pulse 63   Temp 97.6 F (36.4 C)  (Oral)   Resp 16   Ht 5' 10.25" (1.784 m)   Wt 176 lb (79.8 kg)   SpO2 97%   BMI 25.07 kg/m   Physical Exam Constitutional:      General: He is not in acute distress. HENT:     Head: Normocephalic and atraumatic.     Right Ear: External ear normal.     Left Ear: External ear normal.  Eyes:     General: No scleral icterus. Neck:     Musculoskeletal: Normal range of motion.     Thyroid: No thyromegaly.  Cardiovascular:     Rate and Rhythm: Normal rate and regular rhythm.     Heart sounds: Normal heart sounds.  Pulmonary:     Effort: Pulmonary effort is normal.     Breath sounds: Normal breath sounds.  Abdominal:     General: Bowel sounds are normal. There is no distension.     Palpations: Abdomen is soft.     Tenderness: There is no abdominal tenderness. There is no guarding.  Lymphadenopathy:     Cervical: No cervical adenopathy.  Skin:    General: Skin is warm and dry.     Findings: No rash.  Neurological:     Mental Status: He is alert and oriented to person, place, and time.     Cranial Nerves: No cranial nerve deficit.     Motor: No abnormal muscle tone.  Psychiatric:        Behavior: Behavior normal.     ------------------------------------------------------------------------------------------------------------------------------------------------------------------------------------------------------------------- Assessment and Plan  Well adult exam Well adult Orders Placed This Encounter  Procedures  . Comp Met (CMET)  . CBC w/Diff  . Lipid panel  . TSH  Immunizations: UTD Screenings: Lipid  Anticipatory guidance/Risk factor reduction:  Encouraged to continue healthy habits of regular exercise and healthy diet.  Additional recommendations per AVS.

## 2018-12-05 NOTE — Assessment & Plan Note (Signed)
Well adult Orders Placed This Encounter  Procedures  . Comp Met (CMET)  . CBC w/Diff  . Lipid panel  . TSH  Immunizations: UTD Screenings: Lipid  Anticipatory guidance/Risk factor reduction:  Encouraged to continue healthy habits of regular exercise and healthy diet.  Additional recommendations per AVS.

## 2018-12-21 ENCOUNTER — Encounter (HOSPITAL_COMMUNITY): Payer: Self-pay | Admitting: Emergency Medicine

## 2018-12-21 ENCOUNTER — Emergency Department (HOSPITAL_COMMUNITY): Payer: Commercial Managed Care - PPO

## 2018-12-21 ENCOUNTER — Other Ambulatory Visit: Payer: Self-pay

## 2018-12-21 ENCOUNTER — Emergency Department (HOSPITAL_COMMUNITY)
Admission: EM | Admit: 2018-12-21 | Discharge: 2018-12-21 | Disposition: A | Discharge status: Home / Self Care | Payer: Commercial Managed Care - PPO | Attending: Emergency Medicine | Admitting: Emergency Medicine

## 2018-12-21 DIAGNOSIS — Y999 Unspecified external cause status: Secondary | ICD-10-CM | POA: Diagnosis not present

## 2018-12-21 DIAGNOSIS — Y9389 Activity, other specified: Secondary | ICD-10-CM | POA: Insufficient documentation

## 2018-12-21 DIAGNOSIS — S42035A Nondisplaced fracture of lateral end of left clavicle, initial encounter for closed fracture: Secondary | ICD-10-CM | POA: Diagnosis not present

## 2018-12-21 DIAGNOSIS — R0789 Other chest pain: Secondary | ICD-10-CM | POA: Diagnosis not present

## 2018-12-21 DIAGNOSIS — J45909 Unspecified asthma, uncomplicated: Secondary | ICD-10-CM | POA: Diagnosis not present

## 2018-12-21 DIAGNOSIS — Z79899 Other long term (current) drug therapy: Secondary | ICD-10-CM | POA: Diagnosis not present

## 2018-12-21 DIAGNOSIS — Y92481 Parking lot as the place of occurrence of the external cause: Secondary | ICD-10-CM | POA: Insufficient documentation

## 2018-12-21 DIAGNOSIS — S4992XA Unspecified injury of left shoulder and upper arm, initial encounter: Secondary | ICD-10-CM | POA: Diagnosis present
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
## 2018-12-21 MED ORDER — IBUPROFEN 800 MG PO TABS
800.0000 mg | ORAL_TABLET | Freq: Once | ORAL | Status: AC
  Administered 2018-12-21: 800 mg via ORAL
  Filled 2018-12-21: qty 1
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
## 2018-12-21 NOTE — ED Notes (Signed)
Discharge instructions discussed with pt. Pt verbalized understanding. No questions at this time. Pt to go home with wife.
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
## 2018-12-28 ENCOUNTER — Ambulatory Visit: Payer: Self-pay

## 2018-12-28 ENCOUNTER — Ambulatory Visit (INDEPENDENT_AMBULATORY_CARE_PROVIDER_SITE_OTHER): Payer: Commercial Managed Care - PPO | Admitting: Orthopaedic Surgery

## 2018-12-28 DIAGNOSIS — S42035A Nondisplaced fracture of lateral end of left clavicle, initial encounter for closed fracture: Secondary | ICD-10-CM | POA: Diagnosis not present
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
## 2019-01-02 ENCOUNTER — Telehealth: Payer: Self-pay | Admitting: Orthopaedic Surgery

## 2019-01-02 NOTE — Telephone Encounter (Signed)
Pt called in requesting a doctors note taking him out of work until the end of the month until [employer] in.   [phone]

## 2019-01-02 NOTE — Telephone Encounter (Signed)
yes

## 2019-01-02 NOTE — Telephone Encounter (Signed)
Ok for this? 

## 2019-01-02 NOTE — Telephone Encounter (Signed)
IC advised could pick up at the front.

## 2019-01-18 ENCOUNTER — Encounter: Payer: Self-pay | Admitting: Orthopaedic Surgery

## 2019-01-18 ENCOUNTER — Ambulatory Visit: Payer: Self-pay

## 2019-01-18 ENCOUNTER — Ambulatory Visit (INDEPENDENT_AMBULATORY_CARE_PROVIDER_SITE_OTHER): Payer: Commercial Managed Care - PPO | Admitting: Orthopaedic Surgery

## 2019-01-18 DIAGNOSIS — S42035A Nondisplaced fracture of lateral end of left clavicle, initial encounter for closed fracture: Secondary | ICD-10-CM
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
## 2019-01-24 ENCOUNTER — Other Ambulatory Visit: Payer: Self-pay | Admitting: Radiology

## 2019-01-24 ENCOUNTER — Telehealth: Payer: Self-pay | Admitting: Orthopaedic Surgery

## 2019-01-24 DIAGNOSIS — S42035A Nondisplaced fracture of lateral end of left clavicle, initial encounter for closed fracture: Secondary | ICD-10-CM

## 2019-01-24 NOTE — Telephone Encounter (Signed)
Jarrett Soho from [facility] called requesting that a referral be faxed over to them at [phone].  CB#[phone].  Thank you.

## 2019-01-24 NOTE — Telephone Encounter (Signed)
PT orders? I did not see referral placed in chart. Thanks.

## 2019-01-24 NOTE — Telephone Encounter (Signed)
Called facility to advise

## 2019-01-24 NOTE — Telephone Encounter (Signed)
FAXED

## 2019-01-24 NOTE — Telephone Encounter (Signed)
Will put on your desk

## 2019-01-25 ENCOUNTER — Telehealth: Payer: Self-pay | Admitting: Orthopaedic Surgery

## 2019-01-25 NOTE — Telephone Encounter (Signed)
Pt called in requesting a note stating that he can return to work 01-30-19 and with no restrictions.   [phone]

## 2019-01-25 NOTE — Telephone Encounter (Signed)
Ok for this? 

## 2019-01-26 NOTE — Telephone Encounter (Signed)
Patient call back, he will get his RTW note during his appointment with Dr. Erlinda Hong.  Thank you.

## 2019-01-26 NOTE — Telephone Encounter (Signed)
Called patient and left VM of what Ria Comment said and asked him to call back and let me know if that was ok, if so I will write for him

## 2019-01-26 NOTE — Telephone Encounter (Signed)
We can allow him to return with restrictions or desk work, but not full duty until we have repeat xrays which need to  be 4 weeks after last appointment

## 2019-01-26 NOTE — Telephone Encounter (Signed)
He does maintenance work

## 2019-01-26 NOTE — Telephone Encounter (Signed)
What does he do for work?

## 2019-01-30 ENCOUNTER — Encounter: Payer: Self-pay | Admitting: Orthopaedic Surgery

## 2019-01-30 ENCOUNTER — Ambulatory Visit (INDEPENDENT_AMBULATORY_CARE_PROVIDER_SITE_OTHER): Payer: Commercial Managed Care - PPO | Admitting: Orthopaedic Surgery

## 2019-01-30 DIAGNOSIS — S42035D Nondisplaced fracture of lateral end of left clavicle, subsequent encounter for fracture with routine healing: Secondary | ICD-10-CM
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
## 2019-02-01 ENCOUNTER — Telehealth: Payer: Self-pay | Admitting: Orthopaedic Surgery

## 2019-02-01 NOTE — Telephone Encounter (Signed)
Please advise. Thanks.  

## 2019-02-01 NOTE — Telephone Encounter (Signed)
Patient called. He says he needs a note for work stating what his restrictions are. His call back number is [phone].

## 2019-02-02 NOTE — Telephone Encounter (Signed)
I s/w patient and he said that he is unable to return to work with restrictions because his job will not let him. He is wanting to know when he would be released to regular duty??

## 2019-02-02 NOTE — Telephone Encounter (Signed)
No lifting greater than 5 pounds LUE

## 2019-02-05 NOTE — Telephone Encounter (Signed)
Tried calling, no answer. Left very detailed message advising per Mendel Ryder.

## 2019-02-05 NOTE — Telephone Encounter (Signed)
We need to wait until f/u appt in a few weeks to reassess with exam and xrays

## 2019-02-15 ENCOUNTER — Ambulatory Visit (INDEPENDENT_AMBULATORY_CARE_PROVIDER_SITE_OTHER): Payer: Commercial Managed Care - PPO | Admitting: Orthopaedic Surgery

## 2019-02-15 ENCOUNTER — Ambulatory Visit: Payer: Self-pay

## 2019-02-15 DIAGNOSIS — S42035D Nondisplaced fracture of lateral end of left clavicle, subsequent encounter for fracture with routine healing: Secondary | ICD-10-CM | POA: Diagnosis not present
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
## 2019-03-29 ENCOUNTER — Ambulatory Visit (INDEPENDENT_AMBULATORY_CARE_PROVIDER_SITE_OTHER): Payer: Commercial Managed Care - PPO | Admitting: Orthopaedic Surgery

## 2019-03-29 ENCOUNTER — Other Ambulatory Visit: Payer: Self-pay

## 2019-03-29 ENCOUNTER — Encounter: Payer: Self-pay | Admitting: Orthopaedic Surgery

## 2019-03-29 ENCOUNTER — Ambulatory Visit: Payer: Self-pay

## 2019-03-29 DIAGNOSIS — S42035D Nondisplaced fracture of lateral end of left clavicle, subsequent encounter for fracture with routine healing: Secondary | ICD-10-CM | POA: Diagnosis not present

## 2019-03-29 DIAGNOSIS — M25522 Pain in left elbow: Secondary | ICD-10-CM

## 2019-03-29 NOTE — Progress Notes (Signed)
Office Visit Note   Patient: Darin Hart           Date of Birth: 1970-06-05           MRN: [id number] Visit Date: 03/29/2019              Requested by: Darin Coombe, DO [address] [place],  [place] [address] PCP: Darin Coombe, DO   Assessment & Plan: Visit Diagnoses:  1. Closed nondisplaced fracture of acromial end of left clavicle with routine healing, subsequent encounter   2. Pain in left elbow     Plan: In terms of his distal clavicle fracture and this has healed well.  He is not experiencing any symptoms.  He has finished PT.  He is released back to full activity and work without restrictions.  In terms of the left elbow he has chronic triceps insertional tendinopathy.  He does a lot of heavy weightlifting especially bench pressing which I think is aggravating it.  I recommended activity modification.  I have also recommended Voltaren gel.  Questions encouraged and answered.  Follow-up as needed.  Follow-Up Instructions: Return if symptoms worsen or fail to improve.   Orders:  Orders Placed This Encounter  Procedures  . XR Clavicle Left  . XR Elbow 2 Views Left   No orders of the defined types were placed in this encounter.     Procedures: No procedures performed   Clinical Data: No additional findings.   Subjective: Chief Complaint  Patient presents with  . Left Shoulder - Pain    Darin Hart is a 48 year old gentleman who follows up today for his left distal clavicle fracture and evaluation of chronic left elbow pain with periodic aggravation.  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  The elbow pain is posterior over the olecranon process.  Denies any numbness tingling.  Denies any injuries.  Relative rest does help with the pain.  He has tried NSAIDs with temporary relief.   Review of Systems  Constitutional: Negative.   All other systems reviewed and are negative.    Objective: Vital Signs: There  were no vitals taken for this visit.  Physical Exam Vitals signs and nursing note reviewed.  Constitutional:      Appearance: He is well-developed.  Pulmonary:     Effort: Pulmonary effort is normal.  Abdominal:     Palpations: Abdomen is soft.  Skin:    General: Skin is warm.  Neurological:     Mental Status: He is alert and oriented to person, place, and time.  Psychiatric:        Behavior: Behavior normal.        Thought Content: Thought content normal.        Judgment: Judgment normal.     Ortho Exam Left shoulder exam shows no tenderness palpation of the distal clavicle.  He has good range of motion of the shoulder without pain.  There is no crepitus. Left elbow exam shows a palpable bony spur on the tip of the olecranon near the insertion of the triceps tendon.  He has mild discomfort with resisted elbow extension.  He has normal range of motion of the elbow.  He has no mechanical symptoms.  Specialty Comments:  No specialty comments available.  Imaging: Xr Clavicle Left  Result Date: 03/29/2019 Mild fully healed distal clavicle fracture.  AC joint congruent.  Xr Elbow 2 Views Left  Result Date: 03/29/2019 Large bony spur from the  olecranon process consistent with chronic triceps tendinopathy    PMFS History: Patient Active Problem List   Diagnosis Date Noted  . Nondisplaced fracture of lateral end of left clavicle, initial encounter for closed fracture 12/28/2018  . Low testosterone 02/07/2018  . Right shoulder pain 01/30/2018  . Acute bilateral low back pain with bilateral sciatica 12/16/2017  . Well adult exam 11/28/2017   Past Medical History:  Diagnosis Date  . Arthritis   . Asthma   . Heart murmur     Family History  Problem Relation Age of Onset  . Other Neg Hx        hypogonadism    History reviewed. No pertinent surgical history. Social History   Occupational History  . Not on file  Tobacco Use  . Smoking status: Never Smoker  .  Smokeless tobacco: Never Used  Substance and Sexual Activity  . Alcohol use: Yes    Comment: OCCASIONALLY  . Drug use: Never  . Sexual activity: Yes    Partners: Female

## 2019-04-18 ENCOUNTER — Other Ambulatory Visit: Payer: Self-pay | Admitting: Family Medicine

## 2019-05-08 ENCOUNTER — Other Ambulatory Visit: Payer: Self-pay

## 2019-05-08 ENCOUNTER — Ambulatory Visit (INDEPENDENT_AMBULATORY_CARE_PROVIDER_SITE_OTHER): Payer: Commercial Managed Care - PPO | Admitting: Family Medicine

## 2019-05-08 ENCOUNTER — Encounter: Payer: Self-pay | Admitting: Family Medicine

## 2019-05-08 DIAGNOSIS — S39012A Strain of muscle, fascia and tendon of lower back, initial encounter: Secondary | ICD-10-CM | POA: Diagnosis not present

## 2019-05-08 MED ORDER — NAPROXEN 500 MG PO TABS: 500.0000 mg | ORAL_TABLET | Freq: Two times a day (BID) | ORAL | 0 refills | Status: DC

## 2019-05-08 MED ORDER — CYCLOBENZAPRINE HCL 10 MG PO TABS: ORAL_TABLET | ORAL | 0 refills | Status: DC

## 2019-05-08 NOTE — Progress Notes (Signed)
ATILANO COVELLI - 48 y.o. male MRN [id number]  Date of birth: [birth year]   This visit type was conducted due to national recommendations for restrictions regarding the COVID-19 Pandemic (e.g. social distancing).  This format is felt to be most appropriate for this patient at this time.  All issues noted in this document were discussed and addressed.  No physical exam was performed (except for noted visual exam findings with Video Visits).  I discussed the limitations of evaluation and management by telemedicine and the availability of in person appointments. The patient expressed understanding and agreed to proceed.  I connected with@ on 05/08/19 at 10:20 AM EST by a video enabled telemedicine application and verified that I am speaking with the correct person using two identifiers.  Present at visit: Luetta Nutting, DO Linna Hoff   Patient Location: Home [address] [place] [address]   Provider location:   [facility]  Chief Complaint  Patient presents with  . Back Pain    Pt said his back went out a few days ago and continues to have lower back pain.    HPI  ROSEVELT LUU is a 48 y.o. male who presents via audio/video conferencing for a telehealth visit today.  He has complaint of low back pain.  He reports that this started a few days ago after leaning over to pick up socks.  He denies any other injuries.  He denies radiation of pain, numbness, tingling or weakness into his extremities.  He has tried a heating pad for treatment but hasn't gotten much releif from this.    ROS:  A comprehensive ROS was completed and negative except as noted per HPI  Past Medical History:  Diagnosis Date  . Arthritis   . Asthma   . Heart murmur     History reviewed. No pertinent surgical history.  Family History  Problem Relation Age of Onset  . Other Neg Hx        hypogonadism    Social History   Socioeconomic History  . Marital status:  Married    Spouse name: Not on file  . Number of children: Not on file  . Years of education: Not on file  . Highest education level: Not on file  Occupational History  . Not on file  Tobacco Use  . Smoking status: Never Smoker  . Smokeless tobacco: Never Used  Substance and Sexual Activity  . Alcohol use: Yes    Comment: OCCASIONALLY  . Drug use: Never  . Sexual activity: Yes    Partners: Female  Other Topics Concern  . Not on file  Social History Narrative  . Not on file   Social Determinants of Health   Financial Resource Strain: Low Risk   . Difficulty of Paying Living Expenses: Not hard at all  Food Insecurity: No Food Insecurity  . Worried About Charity fundraiser in the Last Year: Never true  . Ran Out of Food in the Last Year: Never true  Transportation Needs: No Transportation Needs  . Lack of Transportation (Medical): No  . Lack of Transportation (Non-Medical): No  Physical Activity:   . Days of Exercise per Week: Not on file  . Minutes of Exercise per Session: Not on file  Stress: Stress Concern Present  . Feeling of Stress : To some extent  Social Connections: Unknown  . Frequency of Communication with Friends and Family: More than three times a week  . Frequency of Social  Gatherings with Friends and Family: More than three times a week  . Attends Religious Services: Not on file  . Active Member of Clubs or Organizations: Not on file  . Attends Banker Meetings: Not on file  . Marital Status: Married  Catering manager Violence: Not At Risk  . Fear of Current or Ex-Partner: No  . Emotionally Abused: No  . Physically Abused: No  . Sexually Abused: No     Current Outpatient Medications:  .  albuterol (VENTOLIN HFA) 108 (90 Base) MCG/ACT inhaler, INHALE 2 PUFFS INTO THE LUNGS EVERY 6 HOURS AS NEEDED FOR WHEEZING OR SHORTNESS OF BREATH, Disp: 6.7 g, Rfl: 1 .  cyclobenzaprine (FLEXERIL) 10 MG tablet, TAKE 1 TABLET(10 MG) BY MOUTH THREE TIMES  DAILY AS NEEDED FOR MUSCLE SPASMS, Disp: 45 tablet, Rfl: 0 .  ibuprofen (ADVIL,MOTRIN) 200 MG tablet, Take 200 mg by mouth every 6 (six) hours as needed for moderate pain., Disp: , Rfl:  .  Multiple Vitamin (MULTIVITAMIN WITH MINERALS) TABS tablet, Take 1 tablet by mouth daily., Disp: , Rfl:  .  Omega-3 Fatty Acids (FISH OIL BURP-LESS PO), Take 2 capsules by mouth daily., Disp: , Rfl:   EXAM:  VITALS per patient if applicable: Temp 97.7 F (36.5 C) (Oral) Comment: per pt. Comment (Src): per pt.  Ht 6' (1.829 m)   BMI 24.82 kg/m   GENERAL: alert, oriented, appears well and in no acute distress  HEENT: atraumatic, conjunttiva clear, no obvious abnormalities on inspection of external nose and ears  NECK: normal movements of the head and neck  LUNGS: on inspection no signs of respiratory distress, breathing rate appears normal, no obvious gross SOB, gasping or wheezing  CV: no obvious cyanosis  MS: moves all visible extremities without noticeable abnormality  PSYCH/NEURO: pleasant and cooperative, no obvious depression or anxiety, speech and thought processing grossly intact  ASSESSMENT AND PLAN:  Discussed the following assessment and plan:  Lumbar strain Recommend gentle strecthes and exercises at home.  May use ice and/or heat as needed for comfort.   Rx for naproxen and flexeril.  Call if symptoms not improving over the next couple of weeks or developing new or worsening symptoms.      I discussed the assessment and treatment plan with the patient. The patient was provided an opportunity to ask questions and all were answered. The patient agreed with the plan and demonstrated an understanding of the instructions.   The patient was advised to call back or seek an in-person evaluation if the symptoms worsen or if the condition fails to improve as anticipated.    Everrett Coombe, DO

## 2019-05-08 NOTE — Assessment & Plan Note (Signed)
Recommend gentle strecthes and exercises at home.  May use ice and/or heat as needed for comfort.   Rx for naproxen and flexeril.  Call if symptoms not improving over the next couple of weeks or developing new or worsening symptoms.

## 2019-05-20 ENCOUNTER — Other Ambulatory Visit: Payer: Self-pay | Admitting: Family Medicine

## 2019-12-19 ENCOUNTER — Encounter (HOSPITAL_COMMUNITY): Payer: Self-pay

## 2019-12-19 ENCOUNTER — Other Ambulatory Visit: Payer: Self-pay

## 2019-12-19 ENCOUNTER — Emergency Department (HOSPITAL_COMMUNITY)
Admission: EM | Admit: 2019-12-19 | Discharge: 2019-12-19 | Disposition: A | Discharge status: Home / Self Care | Payer: Commercial Managed Care - PPO | Attending: Emergency Medicine | Admitting: Emergency Medicine

## 2019-12-19 DIAGNOSIS — J45909 Unspecified asthma, uncomplicated: Secondary | ICD-10-CM | POA: Insufficient documentation

## 2019-12-19 DIAGNOSIS — L03012 Cellulitis of left finger: Secondary | ICD-10-CM | POA: Insufficient documentation

## 2019-12-19 DIAGNOSIS — Z79899 Other long term (current) drug therapy: Secondary | ICD-10-CM | POA: Insufficient documentation

## 2019-12-19 MED ORDER — DOXYCYCLINE HYCLATE 100 MG PO CAPS: 100.0000 mg | ORAL_CAPSULE | Freq: Two times a day (BID) | ORAL | 0 refills | Status: DC

## 2019-12-19 NOTE — Discharge Instructions (Signed)
Soak the finger in warm water 2-3 times a day for .  Start the antibiotic.  Return to the ER if it becomes worse with worsening swelling, redness and pain.

## 2019-12-19 NOTE — ED Triage Notes (Signed)
Patient reports tip of his middle finger on left hand started swelling X1 week ago or so while at work.   4/10 pain.   A/ox4 Ambulatory in triage.

## 2019-12-19 NOTE — ED Provider Notes (Signed)
[facility] Provider Note   CSN: [id number] Arrival date & time: 12/19/19  0347     History Chief Complaint  Patient presents with  . Hand Pain    Darin Hart is a 49 y.o. male.  Patient is a 49 year old male with a history of arthritis and asthma who is presenting today with worsening pain in his left third finger.  Patient reports it started about 10 days ago and is only gradually worsened.  He describes it as a sore throbbing sensation on the medial portion of his finger.  There is been some mild swelling.  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  He denies any significant pain to the pad of his finger or down into his finger or hand.  He did try to poke it with a needle but states it did not help.  He has not tried soaking it and has never had anything like this before.  He denies biting his nails.  The history is provided by the patient.  Hand Pain This is a new problem. Episode onset: 10 days ago. The problem occurs constantly. The problem has been gradually worsening. Associated symptoms comments: Pain in the left 3rd finger.  No known injury.. The symptoms are aggravated by bending. Nothing relieves the symptoms. Treatments tried: tried to poke it with a needle but no help. The treatment provided no relief.       Past Medical History:  Diagnosis Date  . Arthritis   . Asthma   . Heart murmur     Patient Active Problem List   Diagnosis Date Noted  . Lumbar strain 05/08/2019  . Nondisplaced fracture of lateral end of left clavicle, initial encounter for closed fracture 12/28/2018  . Low testosterone 02/07/2018  . Right shoulder pain 01/30/2018  . Acute bilateral low back pain with bilateral sciatica 12/16/2017  . Well adult exam 11/28/2017    History reviewed. No pertinent surgical history.     Family History  Problem Relation  Age of Onset  . Other Neg Hx        hypogonadism    Social History   Tobacco Use  . Smoking status: Never Smoker  . Smokeless tobacco: Never Used  Vaping Use  . Vaping Use: Never used  Substance Use Topics  . Alcohol use: Yes    Comment: OCCASIONALLY  . Drug use: Never    Home Medications Prior to Admission medications   Medication Sig Start Date End Date Taking? Authorizing Provider  albuterol (VENTOLIN HFA) 108 (90 Base) MCG/ACT inhaler INHALE 2 PUFFS INTO THE LUNGS EVERY 6 HOURS AS NEEDED FOR WHEEZING OR SHORTNESS OF BREATH 05/20/19   Everrett Coombe, DO  cyclobenzaprine (FLEXERIL) 10 MG tablet TAKE 1 TABLET(10 MG) BY MOUTH THREE TIMES DAILY AS NEEDED FOR MUSCLE SPASMS 05/08/19   Everrett Coombe, DO  doxycycline (VIBRAMYCIN) 100 MG capsule Take 1 capsule (100 mg total) by mouth 2 (two) times daily. 12/19/19   Gwyneth Sprout, MD  ibuprofen (ADVIL,MOTRIN) 200 MG tablet Take 200 mg by mouth every 6 (six) hours as needed for moderate pain.    [provider]  Multiple Vitamin (MULTIVITAMIN WITH MINERALS) TABS tablet Take 1 tablet by mouth daily.    [provider]  naproxen (NAPROSYN) 500 MG tablet Take 1 tablet (500 mg total) by mouth 2 (two) times daily with a meal. 05/08/19  Everrett Coombe, DO  Omega-3 Fatty Acids (FISH OIL BURP-LESS PO) Take 2 capsules by mouth daily.    [provider]    Allergies    Shellfish allergy, Other, and Penicillins  Review of Systems   Review of Systems  All other systems reviewed and are negative.   Physical Exam Updated Vital Signs BP (!) 134/98   Pulse 76   Temp 98 F (36.7 C)   Resp 17   SpO2 98%   Physical Exam Vitals and nursing note reviewed.  Constitutional:      General: He is not in acute distress.    Appearance: Normal appearance. He is well-developed and normal weight.  HENT:     Head: Normocephalic and atraumatic.  Eyes:     Conjunctiva/sclera: Conjunctivae normal.     Pupils: Pupils are  equal, round, and reactive to light.  Cardiovascular:     Rate and Rhythm: Normal rate.  Pulmonary:     Effort: Pulmonary effort is normal. No respiratory distress.  Musculoskeletal:        General: Tenderness present. Normal range of motion.       Hands:     Cervical back: Normal range of motion and neck supple.  Skin:    General: Skin is warm and dry.     Findings: No erythema or rash.  Neurological:     Mental Status: He is alert and oriented to person, place, and time.  Psychiatric:        Behavior: Behavior normal.     ED Results / Procedures / Treatments   Labs (all labs ordered are listed, but only abnormal results are displayed) Labs Reviewed - No data to display  EKG None  Radiology No results found.  Procedures Procedures (including critical care time)  Medications Ordered in ED Medications - No data to display  ED Course  I have reviewed the triage vital signs and the nursing notes.  Pertinent labs & imaging results that were available during my care of the patient were reviewed by me and considered in my medical decision making (see chart for details).    MDM Rules/Calculators/A&P                          Patient with exam findings concerning for developing paronychia.  No signs of tenosynovitis and low suspicion for felon at this time.  Patient has no tenseness or pain over the pad of the finger but seems more to be on the medial side.  Will have patient soak the finger, start doxycycline.  11 blade scalpel was run underneath the cuticle but no pus was expressed.  Patient denies any trauma and low suspicion for fracture at this time.  Final Clinical Impression(s) / ED Diagnoses Final diagnoses:  Paronychia of left middle finger    Rx / DC Orders ED Discharge Orders         Ordered    doxycycline (VIBRAMYCIN) 100 MG capsule  2 times daily     Discontinue  Reprint     12/19/19 0936           Gwyneth Sprout, MD 12/19/19 [phone]

## 2020-07-15 ENCOUNTER — Ambulatory Visit: Payer: Self-pay | Admitting: Orthopaedic Surgery

## 2020-07-29 ENCOUNTER — Other Ambulatory Visit: Payer: Self-pay

## 2020-07-29 ENCOUNTER — Encounter: Payer: Self-pay | Admitting: Orthopaedic Surgery

## 2020-07-29 ENCOUNTER — Ambulatory Visit (INDEPENDENT_AMBULATORY_CARE_PROVIDER_SITE_OTHER): Payer: 59 | Admitting: Orthopaedic Surgery

## 2020-07-29 ENCOUNTER — Ambulatory Visit: Payer: Self-pay

## 2020-07-29 DIAGNOSIS — M545 Low back pain, unspecified: Secondary | ICD-10-CM

## 2020-07-29 DIAGNOSIS — G8929 Other chronic pain: Secondary | ICD-10-CM

## 2020-07-29 DIAGNOSIS — M25511 Pain in right shoulder: Secondary | ICD-10-CM

## 2020-07-29 MED ORDER — METHYLPREDNISOLONE ACETATE 40 MG/ML IJ SUSP
40.0000 mg | INTRAMUSCULAR | Status: AC | PRN
  Administered 2020-07-29: 40 mg via INTRA_ARTICULAR

## 2020-07-29 MED ORDER — LIDOCAINE HCL 2 % IJ SOLN
2.0000 mL | INTRAMUSCULAR | Status: AC | PRN
  Administered 2020-07-29: 2 mL

## 2020-07-29 MED ORDER — BUPIVACAINE HCL 0.25 % IJ SOLN
2.0000 mL | INTRAMUSCULAR | Status: AC | PRN
  Administered 2020-07-29: 2 mL via INTRA_ARTICULAR

## 2020-07-29 NOTE — Progress Notes (Signed)
Office Visit Note   Patient: Darin Hart           Date of Birth: 15-Feb-1971           MRN: [id number] Visit Date: 07/29/2020              Requested by: Everrett Coombe, DO [address]  [address] [place],  [place] [address] PCP: Everrett Coombe, DO   Assessment & Plan: Visit Diagnoses:  1. Right shoulder pain, unspecified chronicity     Plan: Impression is right shoulder pain following an injury about a month ago and chronic left back pain..  In regards to his shoulder, his physical exam is very unremarkable but he would like to try cortisone injection.  I have agreed to this today.  Have also instructed him that he needs to take several weeks off of weight lifting.  He will follow up with [place] as needed.  In regards to the back,we have referred him to Dr. Otelia Sergeant or Ophelia Charter  Follow-Up Instructions: Return if symptoms worsen or fail to improve.   Orders:  Orders Placed This Encounter  Procedures  . Large Joint Inj  . XR Shoulder Right   No orders of the defined types were placed in this encounter.     Procedures: Large Joint Inj: R subacromial bursa on 07/29/2020 3:20 PM Indications: pain Details: 22 G needle Medications: 2 mL lidocaine 2 %; 2 mL bupivacaine 0.25 %; 40 mg methylPREDNISolone acetate 40 MG/ML Outcome: tolerated well, no immediate complications Patient was prepped and draped in the usual sterile fashion.       Clinical Data: No additional findings.   Subjective: Chief Complaint  Patient presents with  . Right Shoulder - Pain  . Lower Back - Pain    HPI patient is a pleasant 50 year old gentleman who comes in today with right shoulder pain and chronic low back pain.  In regards to his right shoulder, he has had pain for the past month.  He was doing dumbbell flies when the felt a tear to the lateral right shoulder.  He has had an occasional dull pain since which occurs with overhead dumbbell press as well as cross body resisted cables.  He  has tried over-the-counter pain medication without relief of symptoms.  He denies any weakness.  No numbness, tingling or burning.  Other issue he brings up today is back pain with a history of his back going out several times a year for the past several years.  The first 2 episodes he notes that we will all of a sudden get numbness to his legs and would be unable to walk.  This most recent time this did not happen.  He denies any numbness, tingling or burning to either lower extremity.  He currently denies any pain to his back.  No weakness to either lower extremity.  No bowel or bladder change or saddle paresthesias.  He has seen 5 back doctors in the past where he was told that he had a herniated disc but that nobody would operate on him.  Review of Systems as detailed in HPI.  All others reviewed and are negative.   Objective: Vital Signs: There were no vitals taken for this visit.  Physical Exam well-developed well-nourished gentleman in no acute distress.  Alert and oriented x3.  Ortho Exam right shoulder exam reveals full active range of motion without pain.  Full strength throughout.  Negative empty can negative cross body.  No Popeye  deformity.  Negative speeds and O'Brien's test.  Is neurovascular intact distally.  Lumbar exam is unremarkable.  No focal weakness.  Specialty Comments:  No specialty comments available.  Imaging: XR Shoulder Right  Result Date: 07/29/2020 No acute or structural abnormalities    PMFS History: Patient Active Problem List   Diagnosis Date Noted  . Lumbar strain 05/08/2019  . Nondisplaced fracture of lateral end of left clavicle, initial encounter for closed fracture 12/28/2018  . Low testosterone 02/07/2018  . Right shoulder pain 01/30/2018  . Acute bilateral low back pain with bilateral sciatica 12/16/2017  . Well adult exam 11/28/2017   Past Medical History:  Diagnosis Date  . Arthritis   . Asthma   . Heart murmur     Family History   Problem Relation Age of Onset  . Other Neg Hx        hypogonadism    History reviewed. No pertinent surgical history. Social History   Occupational History  . Not on file  Tobacco Use  . Smoking status: Never Smoker  . Smokeless tobacco: Never Used  Vaping Use  . Vaping Use: Never used  Substance and Sexual Activity  . Alcohol use: Yes    Comment: OCCASIONALLY  . Drug use: Never  . Sexual activity: Yes    Partners: Female

## 2020-08-14 ENCOUNTER — Ambulatory Visit: Payer: Self-pay

## 2020-08-14 ENCOUNTER — Encounter: Payer: Self-pay | Admitting: Surgery

## 2020-08-14 ENCOUNTER — Ambulatory Visit (INDEPENDENT_AMBULATORY_CARE_PROVIDER_SITE_OTHER): Payer: 59 | Admitting: Surgery

## 2020-08-14 VITALS — BP 121/84 | HR 98 | Ht 72.0 in | Wt 183.0 lb

## 2020-08-14 DIAGNOSIS — M79661 Pain in right lower leg: Secondary | ICD-10-CM | POA: Diagnosis not present

## 2020-08-14 NOTE — Progress Notes (Signed)
Office Visit Note   Patient: Darin Hart           Date of Birth: 23-Nov-1970           MRN: [id number] Visit Date: 08/14/2020              Requested by: Everrett Coombe, DO [address]  [address] [place],  [place] [address] PCP: Everrett Coombe, DO   Assessment & Plan: Visit Diagnoses:  1. Pain in right lower leg   Question possible right tibia stress fracture  Plan: With patient's acute pain at the distal tibia I recommend getting MRI to rule out tibial stress fracture.  I did speak with Dr. August Saucer to get his opinion on this as well.  I put patient in a long cam boot and instructed him to be nonweightbearing with crutches until we figure this out.  I stressed to him the importance of being compliant.  Given a note taken him out of work for a couple weeks until we have time to review the MRI with him.  Advised him that he absolutely should not be going to the gym since he mentioned that he goes 5 to 6 days/week.  I think it is okay to do some upper body strengthening with weights that he has at home but absolutely no right leg exercises.  All questions answered.  Today in the clinic my assistant did put the cam boot on the patient but she stated that he took it off and did not wear it out of the office.  Follow-Up Instructions: Return in about 2 weeks (around 08/28/2020) for to review right tibia mri.   Orders:  Orders Placed This Encounter  Procedures  . XR Tibia/Fibula Right  . MR TIBIA FIBULA RIGHT WO CONTRAST   No orders of the defined types were placed in this encounter.     Procedures: No procedures performed   Clinical Data: No additional findings.   Subjective: Chief Complaint  Patient presents with  . Lower Back - Pain    HPI 50 year old black male is being seen by me today for acute right lower leg pain.  Patient states that over the last couple weeks he has been having pain about the distal third of his right tibia.  No injury that he can recall.   Patient does state that he works out at the gym 5 to 6 days/week which includes a fair amount of leg workouts.  States that at the time of onset he did not have a specific injury but while he was just walking at work he began having pain in his lower leg.  He was concerned because about 15+ years ago when he was living in [place] he states that he suffered a fracture of his lower leg but is not quite sure of which bones were broken.  Says that he was told that he may have some problem with pain in the future and he was concerned that this acute pain that started a couple weeks ago was from that.  He has been having pain with weightbearing but states that it is somewhat better today since he took a pain pill.  He does a fair amount of strenuous activity with his job and is on his feet all day. Review of Systems No current cardiac pulmonary GI GU issues  Objective: Vital Signs: BP 121/84   Pulse 98   Ht 6' (1.829 m)   Wt 183 lb (83 kg)  BMI 24.82 kg/m   Physical Exam HENT:     Head: Normocephalic.  Eyes:     Extraocular Movements: Extraocular movements intact.  Pulmonary:     Effort: No respiratory distress.  Musculoskeletal:     Comments: Gait is normal.  Can heel and toe gait without difficulty.  Negative logroll bilateral hips.  Negative straight leg raise.  He does have tight hamstrings bilaterally.  Bilateral knee exam unremarkable.  There is an area at the distal third of the tibia where he has marked point tenderness.  There is a slight area of swollen.  Calf is nontender.  Neurological:     Mental Status: He is alert and oriented to person, place, and time.  Psychiatric:        Mood and Affect: Mood normal.     Ortho Exam  Specialty Comments:  No specialty comments available.  Imaging: No results found.   PMFS History: Patient Active Problem List   Diagnosis Date Noted  . Lumbar strain 05/08/2019  . Nondisplaced fracture of lateral end of left clavicle, initial  encounter for closed fracture 12/28/2018  . Low testosterone 02/07/2018  . Right shoulder pain 01/30/2018  . Acute bilateral low back pain with bilateral sciatica 12/16/2017  . Well adult exam 11/28/2017   Past Medical History:  Diagnosis Date  . Arthritis   . Asthma   . Heart murmur     Family History  Problem Relation Age of Onset  . Other Neg Hx        hypogonadism    History reviewed. No pertinent surgical history. Social History   Occupational History  . Not on file  Tobacco Use  . Smoking status: Never Smoker  . Smokeless tobacco: Never Used  Vaping Use  . Vaping Use: Never used  Substance and Sexual Activity  . Alcohol use: Yes    Comment: OCCASIONALLY  . Drug use: Never  . Sexual activity: Yes    Partners: Female

## 2020-08-22 ENCOUNTER — Telehealth: Payer: Self-pay | Admitting: Specialist

## 2020-08-22 ENCOUNTER — Telehealth: Payer: Self-pay | Admitting: Orthopaedic Surgery

## 2020-08-22 NOTE — Telephone Encounter (Signed)
Patient called asked if he can get a letter for his employer stating that he need to sit down  And stand to work. Patient said he can not do a lot of walking at his job. Patient is having the MRI 08/28/2020. Also, patient asked to be put on the wait list for an earlier appointment.  The number to contact patient is [phone]

## 2020-08-22 NOTE — Telephone Encounter (Signed)
Please advise 

## 2020-08-25 ENCOUNTER — Telehealth: Payer: Self-pay | Admitting: Surgery

## 2020-08-25 NOTE — Telephone Encounter (Signed)
Please advise 

## 2020-08-25 NOTE — Telephone Encounter (Signed)
I have not seen him, see Dr. Otelia Sergeant put a telephone note in the chart and pt has seen Xu, did u mean to send to me?

## 2020-08-25 NOTE — Telephone Encounter (Signed)
Pt called asking if he is able to get a light duty work note?

## 2020-08-27 ENCOUNTER — Telehealth: Payer: Self-pay | Admitting: Surgery

## 2020-08-27 NOTE — Telephone Encounter (Signed)
Pt called checking to see wether he could be released for light duty work? He states if so he would like to have no excessive walking or standing listed as a restriction and he would also like a restriction on lifting things. Pt would like a CB when this has been done and is ready for pick up.

## 2020-08-27 NOTE — Telephone Encounter (Signed)
Please advise on this. Thanks 

## 2020-08-28 ENCOUNTER — Ambulatory Visit: Payer: 59 | Admitting: Specialist

## 2020-08-28 ENCOUNTER — Ambulatory Visit
Admission: RE | Admit: 2020-08-28 | Discharge: 2020-08-28 | Disposition: A | Discharge status: Home / Self Care | Payer: 59 | Source: Ambulatory Visit | Attending: Surgery | Admitting: Surgery

## 2020-08-28 ENCOUNTER — Other Ambulatory Visit: Payer: Self-pay

## 2020-08-28 DIAGNOSIS — M79661 Pain in right lower leg: Secondary | ICD-10-CM

## 2020-08-28 IMAGING — MR MR [PERSON_NAME] LOW W/O CM*R*
4 of 5 series · 20 of 40 positions shown · non-contrast
Comparison: Radiograph [DATE]

CLINICAL DATA: Right leg pain for 1 month.

EXAM:
MRI OF LOWER RIGHT EXTREMITY WITHOUT CONTRAST
TECHNIQUE: Multiplanar, multisequence MR imaging of the right lower extremity
was performed. No intravenous contrast was administered.

[Series 4: T1 · coronal · 4.0mm · 0.78mm/px · 4 of 29 slices shown (1 of 2)]
[im 1/29]
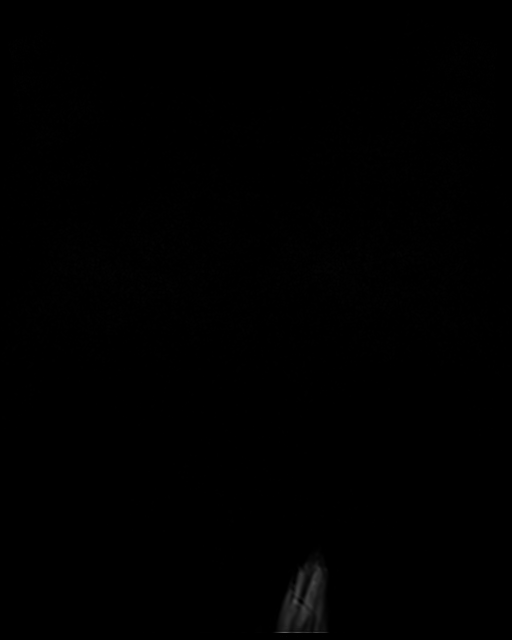
[im 10/29]
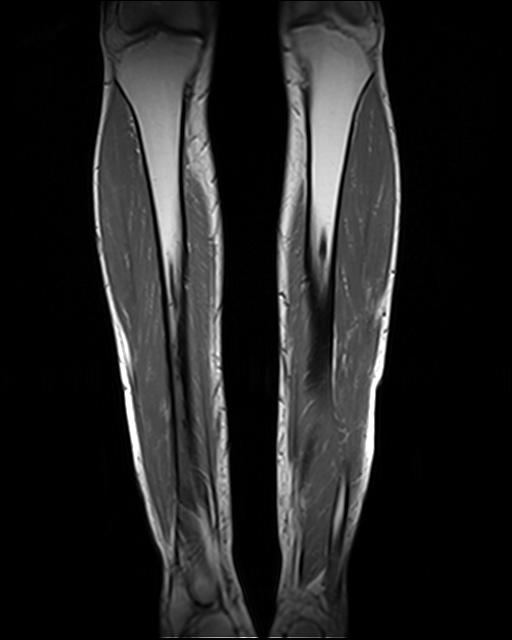
[im 19/29]
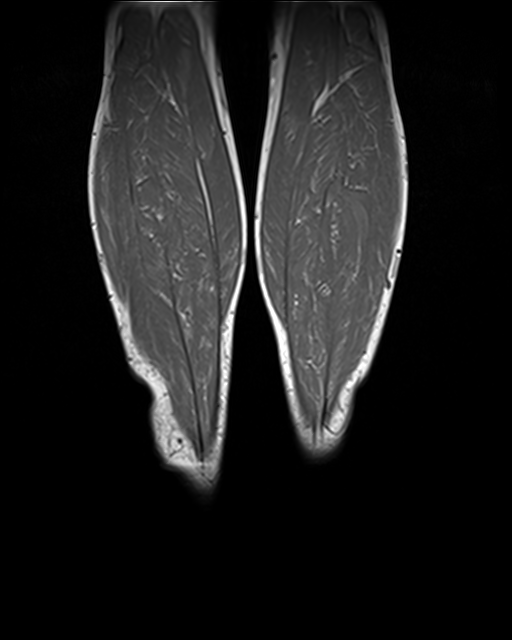
[im 29/29]
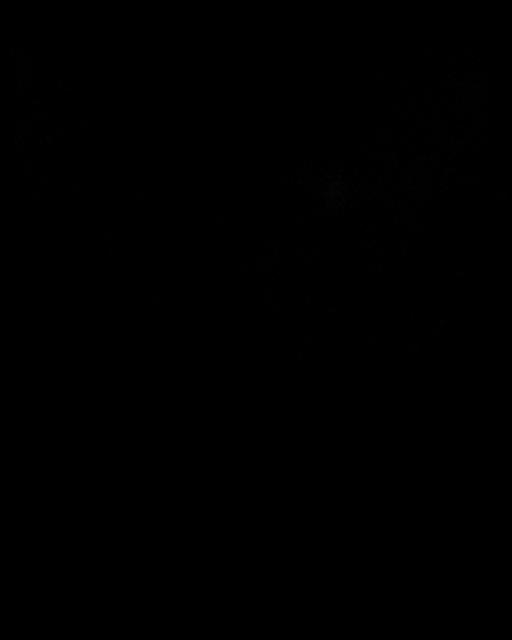

[Series 5: STIR · coronal · 4.0mm · 1.56mm/px · 3 of 29 slices shown]
[im 1/29]
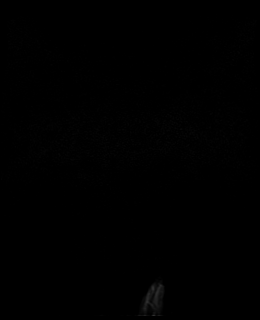
[im 15/29]
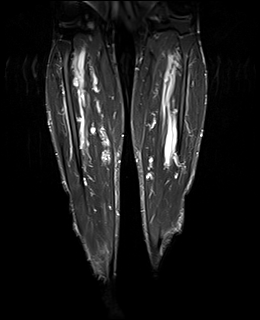
[im 29/29]
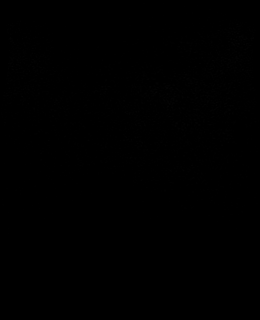

[Series 9: T2 fat-sat · axial · 5.5mm · 0.35mm/px · z∈[-252,+230]mm · 9 of 72 slices shown]
[im 1/72]
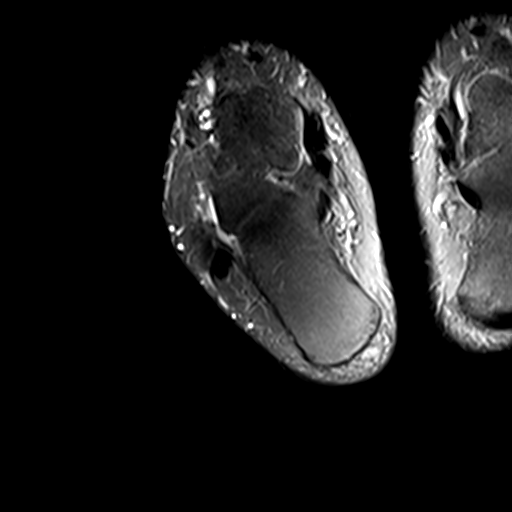
[im 12/72]
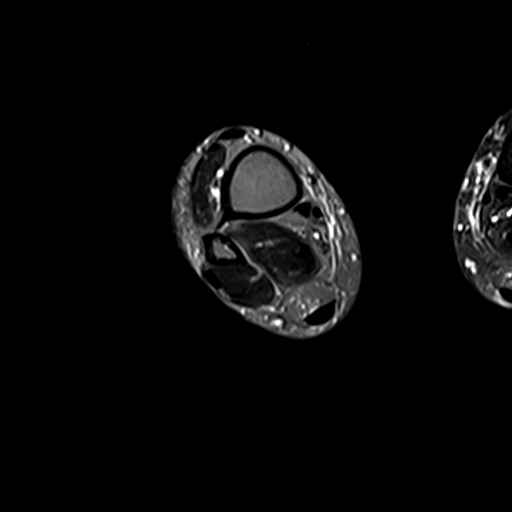
[im 24/72]
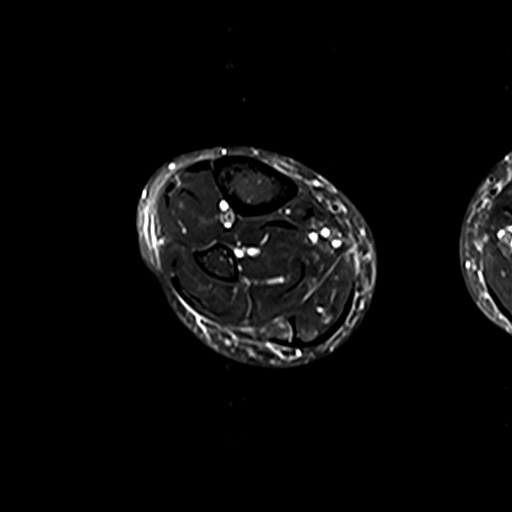
[im 30/72]
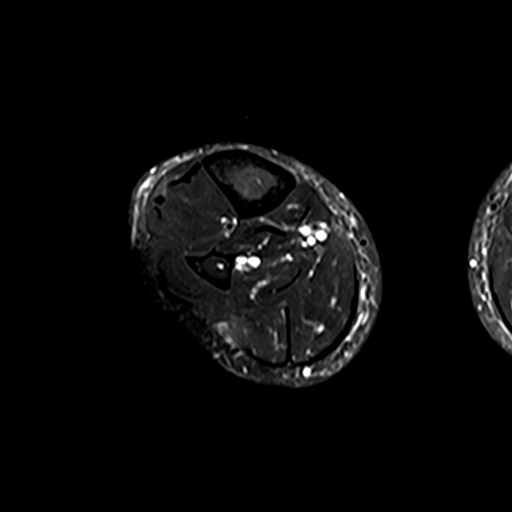
[im 36/72]
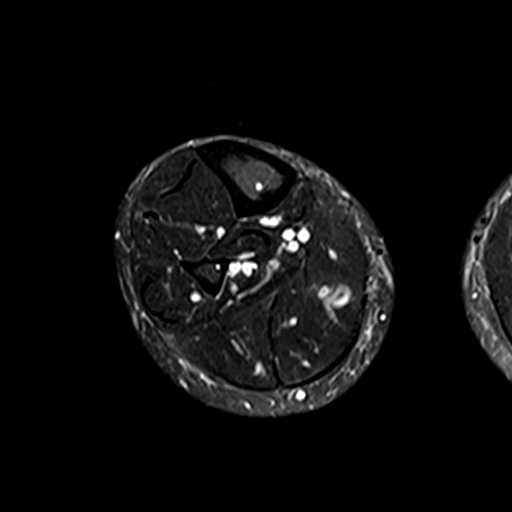
[im 42/72]
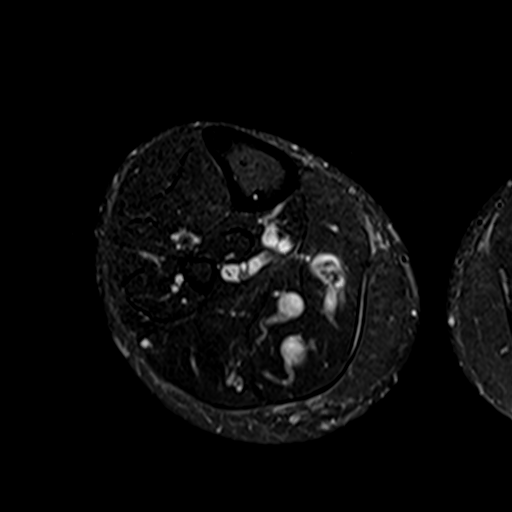
[im 48/72]
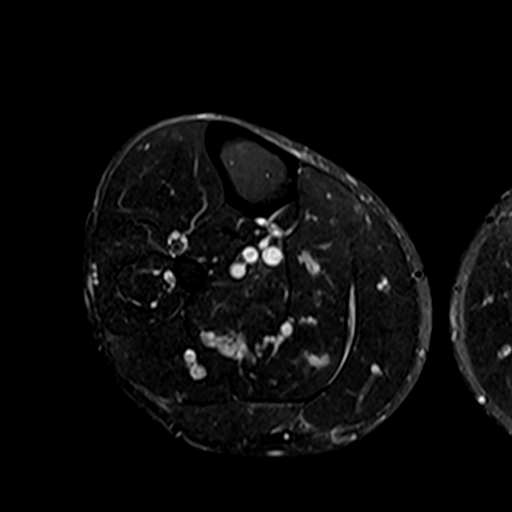
[im 60/72]
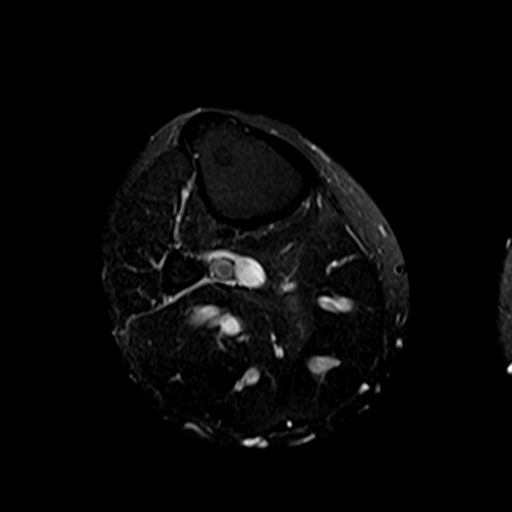
[im 72/72]
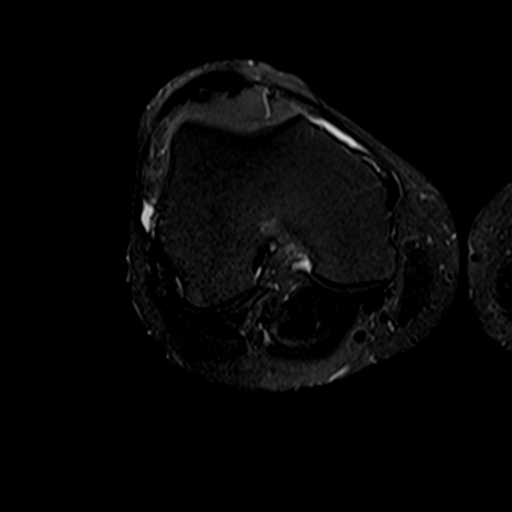

[Series 12: T1 · axial · 5.5mm · 0.35mm/px · z∈[-252,+149]mm · 4 of 72 slices shown (2 of 2)]
[im 1/72]
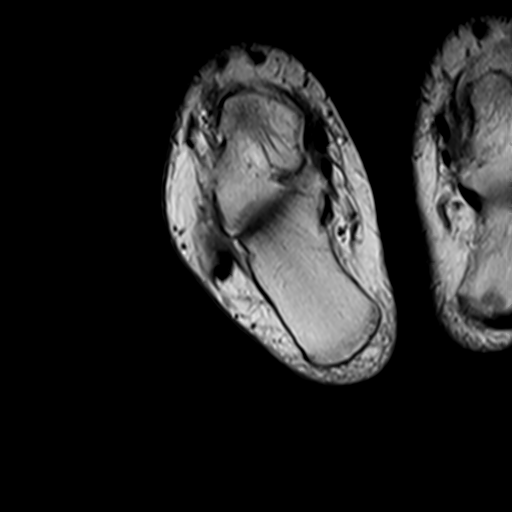
[im 12/72]
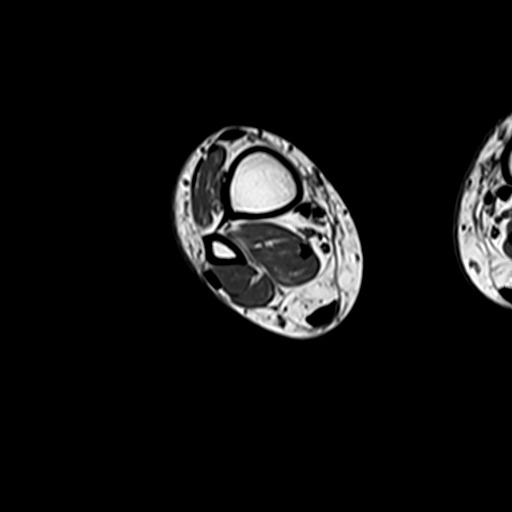
[im 36/72]
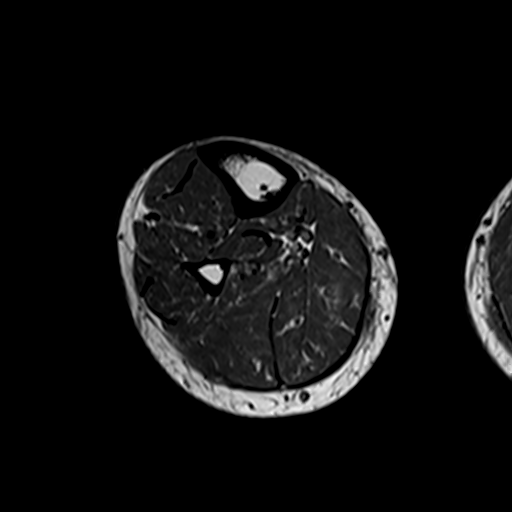
[im 60/72]
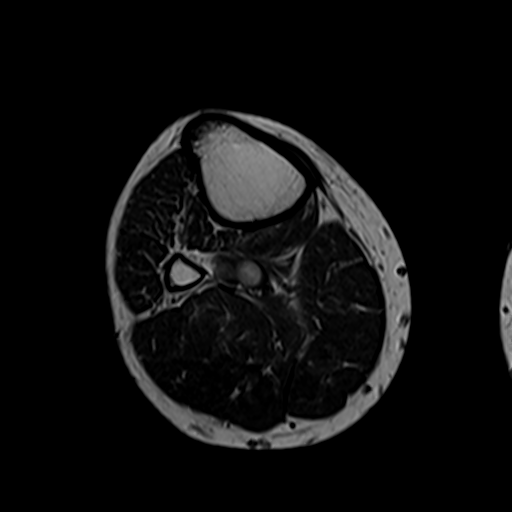

[20 of 40 positions shown; findings below may reference images not displayed]

FINDINGS: The bony structures are unremarkable. No stress fracture or bone
lesion. Mild degenerative changes are noted at both knee joints.

The lower extremity musculature appears normal. No muscle tear or
intramuscular hematoma. No muscle mass or myositis. The upper major
ankle tendons are unremarkable. No ankle joint effusion or
osteochondral lesion.

No subcutaneous lesions are identified.  No subcutaneous hematoma.

No findings to suggest shin splints.
IMPRESSION: 1. Mild degenerative changes at both knee joints.
2. No stress fracture or bone lesion.
3. Normal appearance of the lower extremity musculature.

## 2020-08-29 NOTE — Telephone Encounter (Signed)
Okay to give work note for light duty work.  4-hour days.  No excessive walking or standing.  No lifting greater than 20 pounds.  Please schedule return office visit with Dr. Roda Shutters next week since he is an established patient with him for recheck.  MRI negative for stress fracture.

## 2020-08-29 NOTE — Telephone Encounter (Signed)
Talked with patient and advised him of message below per Zonia Kief.  Patient advised that letter is ready for pickup, but stated that he will get letter on Tuesday at his appointment.

## 2020-09-02 ENCOUNTER — Encounter: Payer: Self-pay | Admitting: Orthopaedic Surgery

## 2020-09-02 ENCOUNTER — Ambulatory Visit (INDEPENDENT_AMBULATORY_CARE_PROVIDER_SITE_OTHER): Payer: 59 | Admitting: Orthopaedic Surgery

## 2020-09-02 DIAGNOSIS — M79661 Pain in right lower leg: Secondary | ICD-10-CM

## 2020-09-02 NOTE — Progress Notes (Signed)
   Office Visit Note   Patient: Darin Hart           Date of Birth: Aug 28, 1970           MRN: [id number] Visit Date: 09/02/2020              Requested by: No referring provider defined for this encounter. PCP: Pcp, No   Assessment & Plan: Visit Diagnoses:  1. Pain in right lower leg     Plan: MRI is negative for stress fracture.  There is some mild degenerative changes in the knee but his exam and history are inconsistent with symptomatic degenerative joint disease.  My impression is shinsplints.  I recommend a foam roller with rest and stretches and ice and Voltaren gel.  Questions encouraged and answered.  Follow-up as needed.  Follow-Up Instructions: Return if symptoms worsen or fail to improve.   Orders:  No orders of the defined types were placed in this encounter.  No orders of the defined types were placed in this encounter.     Procedures: No procedures performed   Clinical Data: No additional findings.   Subjective: Chief Complaint  Patient presents with  . Right Leg - Pain    Darin Hart returns today to review recent right lower leg MRI to evaluate for stress fracture.  Overall he is doing better but has been resting quite a bit.  He is an avid Licensed conveyancer.   Review of Systems   Objective: Vital Signs: There were no vitals taken for this visit.  Physical Exam  Ortho Exam Right lower leg shows no swelling or asymmetry.  Compartments are soft.  Mild bony tenderness at the anterior medial aspect of the mid tibia. Specialty Comments:  No specialty comments available.  Imaging: No results found.   PMFS History: Patient Active Problem List   Diagnosis Date Noted  . Lumbar strain 05/08/2019  . Nondisplaced fracture of lateral end of left clavicle, initial encounter for closed fracture 12/28/2018  . Low testosterone 02/07/2018  . Right shoulder pain 01/30/2018  . Acute bilateral low back pain with bilateral sciatica 12/16/2017  . Well  adult exam 11/28/2017   Past Medical History:  Diagnosis Date  . Arthritis   . Asthma   . Heart murmur     Family History  Problem Relation Age of Onset  . Other Neg Hx        hypogonadism    History reviewed. No pertinent surgical history. Social History   Occupational History  . Not on file  Tobacco Use  . Smoking status: Never Smoker  . Smokeless tobacco: Never Used  Vaping Use  . Vaping Use: Never used  Substance and Sexual Activity  . Alcohol use: Yes    Comment: OCCASIONALLY  . Drug use: Never  . Sexual activity: Yes    Partners: Female

## 2020-10-02 ENCOUNTER — Other Ambulatory Visit: Payer: Self-pay

## 2020-10-02 ENCOUNTER — Encounter: Payer: Self-pay | Admitting: Emergency Medicine

## 2020-10-02 ENCOUNTER — Ambulatory Visit (INDEPENDENT_AMBULATORY_CARE_PROVIDER_SITE_OTHER): Payer: 59 | Admitting: Emergency Medicine

## 2020-10-02 VITALS — BP 120/68 | HR 89 | Temp 98.4°F | Ht 72.0 in | Wt 185.0 lb

## 2020-10-02 DIAGNOSIS — M7918 Myalgia, other site: Secondary | ICD-10-CM

## 2020-10-02 DIAGNOSIS — Z23 Encounter for immunization: Secondary | ICD-10-CM | POA: Diagnosis not present

## 2020-10-02 DIAGNOSIS — Z8709 Personal history of other diseases of the respiratory system: Secondary | ICD-10-CM

## 2020-10-02 DIAGNOSIS — Z7689 Persons encountering health services in other specified circumstances: Secondary | ICD-10-CM

## 2020-10-02 DIAGNOSIS — R7989 Other specified abnormal findings of blood chemistry: Secondary | ICD-10-CM

## 2020-10-02 DIAGNOSIS — Z13228 Encounter for screening for other metabolic disorders: Secondary | ICD-10-CM | POA: Diagnosis not present

## 2020-10-02 DIAGNOSIS — Z130 Encounter for screening for diseases of the blood and blood-forming organs and certain disorders involving the immune mechanism: Secondary | ICD-10-CM | POA: Diagnosis not present

## 2020-10-02 DIAGNOSIS — Z1321 Encounter for screening for nutritional disorder: Secondary | ICD-10-CM | POA: Diagnosis not present

## 2020-10-02 DIAGNOSIS — Z1329 Encounter for screening for other suspected endocrine disorder: Secondary | ICD-10-CM | POA: Diagnosis not present

## 2020-10-02 DIAGNOSIS — Z9189 Other specified personal risk factors, not elsewhere classified: Secondary | ICD-10-CM

## 2020-10-02 DIAGNOSIS — Z13220 Encounter for screening for lipoid disorders: Secondary | ICD-10-CM

## 2020-10-02 DIAGNOSIS — G8929 Other chronic pain: Secondary | ICD-10-CM | POA: Insufficient documentation

## 2020-10-02 DIAGNOSIS — Z889 Allergy status to unspecified drugs, medicaments and biological substances status: Secondary | ICD-10-CM

## 2020-10-02 LAB — COMPREHENSIVE METABOLIC PANEL
ALT: 34 U/L (ref 0–53)
AST: 32 U/L (ref 0–37)
Albumin: 4.5 g/dL (ref 3.5–5.2)
Alkaline Phosphatase: 63 U/L (ref 39–117)
BUN: 23 mg/dL (ref 6–23)
CO2: 30 mEq/L (ref 19–32)
Calcium: 9.4 mg/dL (ref 8.4–10.5)
Chloride: 105 mEq/L (ref 96–112)
Creatinine, Ser: 1.24 mg/dL (ref 0.40–1.50)
GFR: 68.02 mL/min (ref >60.00)
Glucose, Bld: 89 mg/dL (ref 70–99)
Potassium: 4.2 mEq/L (ref 3.5–5.1)
Sodium: 141 mEq/L (ref 135–145)
Total Bilirubin: 0.4 mg/dL (ref 0.2–1.2)
Total Protein: 7.1 g/dL (ref 6.0–8.3)

## 2020-10-02 LAB — CBC WITH DIFFERENTIAL/PLATELET
Basophils Absolute: 0 10*3/uL (ref 0.0–0.1)
Basophils Relative: 0.4 % (ref 0.0–3.0)
Eosinophils Absolute: 0.2 10*3/uL (ref 0.0–0.7)
Eosinophils Relative: 4.2 % (ref 0.0–5.0)
HCT: 41.1 % (ref 39.0–52.0)
Hemoglobin: 14.2 g/dL (ref 13.0–17.0)
Lymphocytes Relative: 33.3 % (ref 12.0–46.0)
Lymphs Abs: 1.6 10*3/uL (ref 0.7–4.0)
MCHC: 34.5 g/dL (ref 30.0–36.0)
MCV: 91.2 fl (ref 78.0–100.0)
Monocytes Absolute: 0.4 10*3/uL (ref 0.1–1.0)
Monocytes Relative: 8.3 % (ref 3.0–12.0)
Neutro Abs: 2.5 10*3/uL (ref 1.4–7.7)
Neutrophils Relative %: 53.8 % (ref 43.0–77.0)
Platelets: 176 10*3/uL (ref 150.0–400.0)
RBC: 4.51 Mil/uL (ref 4.22–5.81)
RDW: 12.9 % (ref 11.5–15.5)
WBC: 4.7 10*3/uL (ref 4.0–10.5)

## 2020-10-02 LAB — LIPID PANEL
Cholesterol: 159 mg/dL (ref 0–200)
HDL: 51.7 mg/dL (ref >39.00)
LDL Cholesterol: 80 mg/dL (ref 0–99)
NonHDL: 107.77
Total CHOL/HDL Ratio: 3
Triglycerides: 138 mg/dL (ref 0.0–149.0)
VLDL: 27.6 mg/dL (ref 0.0–40.0)

## 2020-10-02 LAB — TSH: TSH: 1.32 u[IU]/mL (ref 0.35–4.50)

## 2020-10-02 LAB — HEMOGLOBIN A1C: Hgb A1c MFr Bld: 5.5 % (ref 4.6–6.5)

## 2020-10-02 MED ORDER — ALBUTEROL SULFATE HFA 108 (90 BASE) MCG/ACT IN AERS: 2.0000 | INHALATION_SPRAY | Freq: Four times a day (QID) | RESPIRATORY_TRACT | 3 refills | Status: DC | PRN

## 2020-10-02 NOTE — Assessment & Plan Note (Signed)
Health self grade: A. Healthy male with a healthy lifestyle.  Works out regularly.  Good quality of life. Chronic musculoskeletal pains expected for his age and amount of physical activity. Advised to take Tylenol and or Advil as needed but he does not like taking medications. No medical concerns identified during this visit.

## 2020-10-02 NOTE — Progress Notes (Signed)
Darin Hart 50 y.o.   Chief Complaint  Patient presents with  . New Patient (Initial Visit)    Pt would like to address his knee pain  (both)and some medications to help. Pt would also like refill of albuterol    HISTORY OF PRESENT ILLNESS: This is a 50 y.o. male first visit to this office, here to establish care with me. Healthy male with a healthy lifestyle.  Works out regularly.  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 Non-smoker. Has regular intermittent pains to muscles, back and joints but does not like taking medications.  Yoga and exercise works for him. Takes testosterone replacement. Has history of asthma but does not use albuterol regularly. Has history of year-long allergies since he was young. Up-to-date with colonoscopy. No other complaints or medical concerns today.  HPI   Prior to Admission medications   Medication Sig Start Date End Date Taking? Authorizing Provider  albuterol (VENTOLIN HFA) 108 (90 Base) MCG/ACT inhaler INHALE 2 PUFFS INTO THE LUNGS EVERY 6 HOURS AS NEEDED FOR WHEEZING OR SHORTNESS OF BREATH 05/20/19  Yes Everrett Coombe, DO  cyclobenzaprine (FLEXERIL) 10 MG tablet TAKE 1 TABLET(10 MG) BY MOUTH THREE TIMES DAILY AS NEEDED FOR MUSCLE SPASMS 05/08/19  Yes Everrett Coombe, DO  doxycycline (VIBRAMYCIN) 100 MG capsule Take 1 capsule (100 mg total) by mouth 2 (two) times daily. 12/19/19  Yes Gwyneth Sprout, MD  ibuprofen (ADVIL,MOTRIN) 200 MG tablet Take 200 mg by mouth every 6 (six) hours as needed for moderate pain.   Yes [provider]  Multiple Vitamin (MULTIVITAMIN WITH MINERALS) TABS tablet Take 1 tablet by mouth daily.   Yes [provider]  naproxen (NAPROSYN) 500 MG tablet Take 1 tablet (500 mg total) by mouth 2 (two) times daily with a meal. 05/08/19  Yes Everrett Coombe, DO  Omega-3 Fatty Acids (FISH OIL BURP-LESS PO) Take 2 capsules by mouth daily.   Yes [provider]    Allergies  Allergen Reactions  . Shellfish  Allergy Anaphylaxis  . Other Other (See Comments)    Migraines, itchy tongue, burning mouth All Tree Nuts  . Penicillins     Pt was told he could not take this, his reaction is unknown Has patient had a PCN reaction causing immediate rash, facial/tongue/throat swelling, SOB or lightheadedness with hypotension: Unknown Has patient had a PCN reaction causing severe rash involving mucus membranes or skin necrosis: Unknown Has patient had a PCN reaction that required hospitalization: NO Has patient had a PCN reaction occurring within the last 10 years: NO If all of the above answers are "NO", then may proceed with Cephalospo    Patient Active Problem List   Diagnosis Date Noted  . Low testosterone 02/07/2018    Past Medical History:  Diagnosis Date  . Arthritis   . Asthma   . Heart murmur     Past Surgical History:  Procedure Laterality Date  . HERNIA REPAIR    . LEG SURGERY    . RHINOPLASTY    . spontaneous neurothroax      Social History   Socioeconomic History  . Marital status: Married    Spouse name: Not on file  . Number of children: Not on file  . Years of education: Not on file  . Highest education level: Not on file  Occupational History  . Not on file  Tobacco Use  . Smoking status: Never Smoker  . Smokeless tobacco: Never Used  Vaping Use  . Vaping Use: Never  used  Substance and Sexual Activity  . Alcohol use: Yes    Comment: OCCASIONALLY  . Drug use: Never  . Sexual activity: Yes    Partners: Female  Other Topics Concern  . Not on file  Social History Narrative  . Not on file   Social Determinants of Health   Financial Resource Strain: Not on file  Food Insecurity: Not on file  Transportation Needs: Not on file  Physical Activity: Not on file  Stress: Not on file  Social Connections: Not on file  Intimate Partner Violence: Not on file    Family History  Problem Relation Age of Onset  . Other Neg Hx        hypogonadism     Review of  Systems  Constitutional: Negative.  Negative for fever.  HENT: Negative.  Negative for congestion and sore throat.   Respiratory: Negative.  Negative for cough and shortness of breath.   Cardiovascular: Negative.  Negative for chest pain and palpitations.  Gastrointestinal: Negative for abdominal pain, blood in stool, diarrhea, melena, nausea and vomiting.  Genitourinary: Negative.  Negative for dysuria and hematuria.  Musculoskeletal: Positive for back pain and joint pain.  Skin: Negative.   Neurological: Negative.  Negative for dizziness and headaches.  Endo/Heme/Allergies: Positive for environmental allergies.  All other systems reviewed and are negative.  Today's Vitals   10/02/20 0825  BP: 120/68  Pulse: 89  Temp: 98.4 F (36.9 C)  TempSrc: Oral  SpO2: 98%  Weight: 185 lb (83.9 kg)  Height: 6' (1.829 m)   Body mass index is 25.09 kg/m.   Physical Exam Vitals reviewed.  Constitutional:      Appearance: Normal appearance.  HENT:     Head: Normocephalic and atraumatic.     Right Ear: Tympanic membrane, ear canal and external ear normal.     Left Ear: Tympanic membrane, ear canal and external ear normal.  Eyes:     Extraocular Movements: Extraocular movements intact.     Conjunctiva/sclera: Conjunctivae normal.     Pupils: Pupils are equal, round, and reactive to light.  Cardiovascular:     Rate and Rhythm: Normal rate and regular rhythm.     Pulses: Normal pulses.     Heart sounds: Normal heart sounds.  Pulmonary:     Effort: Pulmonary effort is normal.     Breath sounds: Normal breath sounds.  Abdominal:     General: Bowel sounds are normal. There is no distension.     Palpations: Abdomen is soft.     Tenderness: There is no abdominal tenderness.  Musculoskeletal:     Cervical back: Normal range of motion and neck supple.  Skin:    Capillary Refill: Capillary refill takes less than 2 seconds.  Neurological:     General: No focal deficit present.     Mental  Status: He is alert and oriented to person, place, and time.  Psychiatric:        Mood and Affect: Mood normal.        Behavior: Behavior normal.      ASSESSMENT & PLAN: A total of 45 minutes was spent with the patient and counseling/coordination of care regarding establishing care with me, review of past medical history, health maintenance items, education on nutrition, review of most recent office visit notes with different doctors, need for blood work today, prognosis, anticipatory guidance according to his age, treatment of chronic musculoskeletal pains, review of all medications including testosterone, documentation and need for follow-up.  Chronic musculoskeletal pain Health self grade: A. Healthy male with a healthy lifestyle.  Works out regularly.  Good quality of life. Chronic musculoskeletal pains expected for his age and amount of physical activity. Advised to take Tylenol and or Advil as needed but he does not like taking medications. No medical concerns identified during this visit.  History of asthma Well-controlled.  Uses albuterol infrequently. Needs medication refill.  History of multiple allergies Nonseasonal chronic allergies since he was a kid.  Only over-the-counter medication that works is Benadryl but it makes him too sleepy.  Nothing else seems to work. However not affecting his quality of life and able to cope.  Low testosterone Has seen Dr. Everardo All before.  Takes testosterone supplement. Wants his testosterone levels checked today.  He will follow-up with Dr. Everardo All as needed.  Darin Hart was seen today for new patient (initial visit).  Diagnoses and all orders for this visit:  Chronic musculoskeletal pain  Encounter to establish care  History of multiple allergies  History of asthma -     albuterol (VENTOLIN HFA) 108 (90 Base) MCG/ACT inhaler; Inhale 2 puffs into the lungs every 6 (six) hours as needed for wheezing or shortness of  breath.  Screening for endocrine, nutritional, metabolic and immunity disorder -     Comprehensive metabolic panel -     TestT+TestF+SHBG -     Hemoglobin A1c -     TSH  Screening for deficiency anemia -     CBC with Differential/Platelet  Screening for lipoid disorders -     Lipid panel  Low testosterone  Other orders -     Tdap vaccine greater than or equal to 7yo IM    Patient Instructions   Health Maintenance, Male Adopting a healthy lifestyle and getting preventive care are important in promoting health and wellness. Ask your health care provider about:  The right schedule for you to have regular tests and exams.  Things you can do on your own to prevent diseases and keep yourself healthy. What should I know about diet, weight, and exercise? Eat a healthy diet  Eat a diet that includes plenty of vegetables, fruits, low-fat dairy products, and lean protein.  Do not eat a lot of foods that are high in solid fats, added sugars, or sodium.   Maintain a healthy weight Body mass index (BMI) is a measurement that can be used to identify possible weight problems. It estimates body fat based on height and weight. Your health care provider can help determine your BMI and help you achieve or maintain a healthy weight. Get regular exercise Get regular exercise. This is one of the most important things you can do for your health. Most adults should:  Exercise for at least 150 minutes each week. The exercise should increase your heart rate and make you sweat (moderate-intensity exercise).  Do strengthening exercises at least twice a week. This is in addition to the moderate-intensity exercise.  Spend less time sitting. Even light physical activity can be beneficial. Watch cholesterol and blood lipids Have your blood tested for lipids and cholesterol at 50 years of age, then have this test every 5 years. You may need to have your cholesterol levels checked more often if:  Your  lipid or cholesterol levels are high.  You are older than 50 years of age.  You are at high risk for heart disease. What should I know about cancer screening? Many types of cancers can be detected early and may often be  prevented. Depending on your health history and family history, you may need to have cancer screening at various ages. This may include screening for:  Colorectal cancer.  Prostate cancer.  Skin cancer.  Lung cancer. What should I know about heart disease, diabetes, and high blood pressure? Blood pressure and heart disease  High blood pressure causes heart disease and increases the risk of stroke. This is more likely to develop in people who have high blood pressure readings, are of African descent, or are overweight.  Talk with your health care provider about your target blood pressure readings.  Have your blood pressure checked: ? Every 3-5 years if you are 29-20 years of age. ? Every year if you are 7 years old or older.  If you are between the ages of 35 and 42 and are a current or former smoker, ask your health care provider if you should have a one-time screening for abdominal aortic aneurysm (AAA). Diabetes Have regular diabetes screenings. This checks your fasting blood sugar level. Have the screening done:  Once every three years after age 74 if you are at a normal weight and have a low risk for diabetes.  More often and at a younger age if you are overweight or have a high risk for diabetes. What should I know about preventing infection? Hepatitis B If you have a higher risk for hepatitis B, you should be screened for this virus. Talk with your health care provider to find out if you are at risk for hepatitis B infection. Hepatitis C Blood testing is recommended for:  Everyone born from [birth year] through 1965.  Anyone with known risk factors for hepatitis C. Sexually transmitted infections (STIs)  You should be screened each year for STIs, including  gonorrhea and chlamydia, if: ? You are sexually active and are younger than 50 years of age. ? You are older than 50 years of age and your health care provider tells you that you are at risk for this type of infection. ? Your sexual activity has changed since you were last screened, and you are at increased risk for chlamydia or gonorrhea. Ask your health care provider if you are at risk.  Ask your health care provider about whether you are at high risk for HIV. Your health care provider may recommend a prescription medicine to help prevent HIV infection. If you choose to take medicine to prevent HIV, you should first get tested for HIV. You should then be tested every 3 months for as long as you are taking the medicine. Follow these instructions at home: Lifestyle  Do not use any products that contain nicotine or tobacco, such as cigarettes, e-cigarettes, and chewing tobacco. If you need help quitting, ask your health care provider.  Do not use street drugs.  Do not share needles.  Ask your health care provider for help if you need support or information about quitting drugs. Alcohol use  Do not drink alcohol if your health care provider tells you not to drink.  If you drink alcohol: ? Limit how much you have to 0-2 drinks a day. ? Be aware of how much alcohol is in your drink. In the [place] one drink equals one 12 oz bottle of beer (355 mL), one 5 oz glass of wine (148 mL), or one 1 oz glass of hard liquor (44 mL). General instructions  Schedule regular health, dental, and eye exams.  Stay current with your vaccines.  Tell your health care provider if: ? You  often feel depressed. ? You have ever been abused or do not feel safe at home. Summary  Adopting a healthy lifestyle and getting preventive care are important in promoting health and wellness.  Follow your health care provider's instructions about healthy diet, exercising, and getting tested or screened for  diseases.  Follow your health care provider's instructions on monitoring your cholesterol and blood pressure. This information is not intended to replace advice given to you by your health care provider. Make sure you discuss any questions you have with your health care provider. Document Revised: 05/03/2018 Document Reviewed: 05/03/2018 Elsevier Patient Education  2021 Elsevier Inc.    Edwina BarthMiguel Adylin Hankey, MD [facility]

## 2020-10-02 NOTE — Assessment & Plan Note (Signed)
Has seen Dr. Everardo All before.  Takes testosterone supplement. Wants his testosterone levels checked today.  He will follow-up with Dr. Everardo All as needed.

## 2020-10-02 NOTE — Assessment & Plan Note (Signed)
Nonseasonal chronic allergies since he was a kid.  Only over-the-counter medication that works is Benadryl but it makes him too sleepy.  Nothing else seems to work. However not affecting his quality of life and able to cope.

## 2020-10-02 NOTE — Assessment & Plan Note (Addendum)
Well-controlled.  Uses albuterol infrequently. Needs medication refill.

## 2020-10-02 NOTE — Patient Instructions (Signed)

## 2020-10-03 ENCOUNTER — Encounter: Payer: Self-pay | Admitting: Emergency Medicine

## 2020-10-10 LAB — TESTT+TESTF+SHBG
Sex Hormone Binding: 25.6 nmol/L (ref 19.3–76.4)
Testosterone, Free: 6.1 pg/mL — ABNORMAL LOW (ref 7.2–24.0)
Testosterone, Total, LC/MS: 374.6 ng/dL (ref 264.0–916.0)

## 2020-10-13 ENCOUNTER — Encounter: Payer: Self-pay | Admitting: Emergency Medicine

## 2020-10-13 ENCOUNTER — Other Ambulatory Visit: Payer: Self-pay | Admitting: Emergency Medicine

## 2020-10-13 DIAGNOSIS — E291 Testicular hypofunction: Secondary | ICD-10-CM

## 2020-10-21 ENCOUNTER — Ambulatory Visit (INDEPENDENT_AMBULATORY_CARE_PROVIDER_SITE_OTHER): Payer: 59 | Admitting: Endocrinology

## 2020-10-21 ENCOUNTER — Encounter: Payer: Self-pay | Admitting: Endocrinology

## 2020-10-21 ENCOUNTER — Other Ambulatory Visit: Payer: Self-pay

## 2020-10-21 VITALS — BP 122/82 | HR 97 | Ht 72.0 in | Wt 183.8 lb

## 2020-10-21 DIAGNOSIS — R7989 Other specified abnormal findings of blood chemistry: Secondary | ICD-10-CM | POA: Diagnosis not present

## 2020-10-21 DIAGNOSIS — Z125 Encounter for screening for malignant neoplasm of prostate: Secondary | ICD-10-CM

## 2020-10-21 LAB — PSA: PSA: 1.73 ng/mL (ref 0.10–4.00)

## 2020-10-21 NOTE — Patient Instructions (Signed)
blood tests are requested for you today.  We'll let you know about the results. Testosterone treatment has risks, including increased or decreased fertility (depending on the type of treatment), hair loss, prostate cancer, benign prostate enlargement, blood clots, liver problems, lower hdl ("good cholesterol"), polycythemia (opposite of anemia), sleep apnea, and behavior changes.

## 2020-10-21 NOTE — Progress Notes (Signed)
Subjective:    Patient ID: Darin Hart, male    DOB: [birth year], 50 y.o.   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  He started taking SARM pills last week.  Pt reports fatigue.   Past Medical History:  Diagnosis Date  . Arthritis   . Asthma   . Heart murmur     Past Surgical History:  Procedure Laterality Date  . HERNIA REPAIR    . LEG SURGERY    . RHINOPLASTY    . spontaneous neurothroax      Social History   Socioeconomic History  . Marital status: Married    Spouse name: Not on file  . Number of children: Not on file  . Years of education: Not on file  . Highest education level: Not on file  Occupational History  . Not on file  Tobacco Use  . Smoking status: Never Smoker  . Smokeless tobacco: Never Used  Vaping Use  . Vaping Use: Never used  Substance and Sexual Activity  . Alcohol use: Yes    Comment: OCCASIONALLY  . Drug use: Never  . Sexual activity: Yes    Partners: Female  Other Topics Concern  . Not on file  Social History Narrative  . Not on file   Social Determinants of Health   Financial Resource Strain: Not on file  Food Insecurity: Not on file  Transportation Needs: Not on file  Physical Activity: Not on file  Stress: Not on file  Social Connections: Not on file  Intimate Partner Violence: Not on file    Current Outpatient Medications on File Prior to Visit  Medication Sig Dispense Refill  . albuterol (VENTOLIN HFA) 108 (90 Base) MCG/ACT inhaler Inhale 2 puffs into the lungs every 6 (six) hours as needed for wheezing or shortness of breath. 18 g 3  . cyclobenzaprine (FLEXERIL) 10 MG tablet TAKE 1 TABLET(10 MG) BY MOUTH THREE TIMES DAILY AS NEEDED FOR MUSCLE SPASMS 45 tablet 0  . doxycycline (VIBRAMYCIN) 100 MG capsule Take 1 capsule (100 mg total) by mouth 2 (two) times daily. 14  capsule 0  . ibuprofen (ADVIL,MOTRIN) 200 MG tablet Take 200 mg by mouth every 6 (six) hours as needed for moderate pain.    . Multiple Vitamin (MULTIVITAMIN WITH MINERALS) TABS tablet Take 1 tablet by mouth daily.    . naproxen (NAPROSYN) 500 MG tablet Take 1 tablet (500 mg total) by mouth 2 (two) times daily with a meal. 30 tablet 0  . Omega-3 Fatty Acids (FISH OIL BURP-LESS PO) Take 2 capsules by mouth daily.     No current facility-administered medications on file prior to visit.     Family History  Problem Relation Age of Onset  . Other Neg Hx        hypogonadism    BP 122/82 (BP Location: Right Arm, Patient Position: Sitting, Cuff Size: Large)   Pulse 97   Ht 6' (1.829 m)   Wt 183 lb 12.8 oz (83.4 kg)   SpO2 98%   BMI 24.93 kg/m     Review of Systems     Objective:   Physical Exam VITAL SIGNS:  See vs page.  GENERAL: no distress.   Ext: no leg edema.   Lab Results  Component Value Date   TESTOSTERONE 312 10/21/2020  Lab Results  Component Value Date   WBC 4.7 10/02/2020   HGB 14.2 10/02/2020   HCT 41.1 10/02/2020   MCV 91.2 10/02/2020   PLT 176.0 10/02/2020   Lab Results  Component Value Date   TSH 1.32 10/02/2020       Assessment & Plan:  Low testosterone.  Normal on recheck today. I told pt no medication is needed for this.   Prostate cancer screening is needed.  Check PSA today.  Patient Instructions  blood tests are requested for you today.  We'll let you know about the results. Testosterone treatment has risks, including increased or decreased fertility (depending on the type of treatment), hair loss, prostate cancer, benign prostate enlargement, blood clots, liver problems, lower hdl ("good cholesterol"), polycythemia (opposite of anemia), sleep apnea, and behavior changes.

## 2020-10-22 LAB — PROLACTIN: Prolactin: 5.8 ng/mL (ref 2.0–18.0)

## 2020-10-22 LAB — LUTEINIZING HORMONE: LH: 4.36 m[IU]/mL (ref 1.50–9.30)

## 2020-10-23 ENCOUNTER — Encounter: Payer: Self-pay | Admitting: Endocrinology

## 2020-10-23 ENCOUNTER — Telehealth: Payer: Self-pay | Admitting: Endocrinology

## 2020-10-23 LAB — TESTOSTERONE,FREE AND TOTAL
Testosterone, Free: 12.3 pg/mL (ref 7.2–24.0)
Testosterone: 312 ng/dL (ref 264–916)

## 2020-10-23 NOTE — Telephone Encounter (Signed)
LVM for pt to return cb to the office

## 2020-10-23 NOTE — Telephone Encounter (Signed)
Message sent thru MyChart 

## 2020-10-23 NOTE — Telephone Encounter (Signed)
F/u  Returning call back to nurse.  

## 2020-10-23 NOTE — Telephone Encounter (Signed)
Patient returned call from this office - patient following up on MyChart message regarding testosterone and possibly taking Nugenix - call back # [phone].

## 2020-10-23 NOTE — Telephone Encounter (Signed)
OK 

## 2020-10-23 NOTE — Telephone Encounter (Signed)
Spoke with pt and he wanted to know if he can take Nugenix since his testosterone levels are low.  Please Advise

## 2020-10-23 NOTE — Telephone Encounter (Signed)
Please advise 

## 2020-10-29 ENCOUNTER — Ambulatory Visit: Payer: 59 | Admitting: Specialist

## 2020-10-30 ENCOUNTER — Ambulatory Visit: Payer: 59 | Admitting: Surgery

## 2020-10-30 ENCOUNTER — Ambulatory Visit: Payer: Self-pay

## 2020-10-30 ENCOUNTER — Other Ambulatory Visit: Payer: Self-pay

## 2020-10-30 ENCOUNTER — Ambulatory Visit (INDEPENDENT_AMBULATORY_CARE_PROVIDER_SITE_OTHER): Payer: 59 | Admitting: Surgery

## 2020-10-30 VITALS — BP 120/75 | HR 83 | Ht 72.0 in | Wt 187.0 lb

## 2020-10-30 DIAGNOSIS — M4316 Spondylolisthesis, lumbar region: Secondary | ICD-10-CM | POA: Diagnosis not present

## 2020-10-30 DIAGNOSIS — G8929 Other chronic pain: Secondary | ICD-10-CM | POA: Diagnosis not present

## 2020-10-30 DIAGNOSIS — M545 Low back pain, unspecified: Secondary | ICD-10-CM | POA: Diagnosis not present

## 2020-11-09 ENCOUNTER — Other Ambulatory Visit: Payer: Self-pay | Admitting: Family Medicine

## 2020-11-11 ENCOUNTER — Encounter: Payer: 59 | Admitting: Emergency Medicine

## 2020-11-20 ENCOUNTER — Encounter: Payer: Self-pay | Admitting: Surgery

## 2020-11-20 ENCOUNTER — Ambulatory Visit (INDEPENDENT_AMBULATORY_CARE_PROVIDER_SITE_OTHER): Payer: 59 | Admitting: Surgery

## 2020-11-20 VITALS — BP 127/86 | HR 84 | Ht 72.0 in | Wt 187.0 lb

## 2020-11-20 DIAGNOSIS — M5416 Radiculopathy, lumbar region: Secondary | ICD-10-CM | POA: Diagnosis not present

## 2020-11-20 MED ORDER — METHYLPREDNISOLONE ACETATE 40 MG/ML IJ SUSP: 40.0000 mg | Freq: Once | INTRAMUSCULAR | Status: DC

## 2020-11-20 MED ORDER — METHOCARBAMOL 500 MG PO TABS: 500.0000 mg | ORAL_TABLET | Freq: Three times a day (TID) | ORAL | 0 refills | Status: DC | PRN

## 2020-11-20 MED ORDER — KETOROLAC TROMETHAMINE 30 MG/ML IJ SOLN: 30.0000 mg | Freq: Once | INTRAMUSCULAR | Status: AC

## 2020-11-20 MED ORDER — KETOROLAC TROMETHAMINE 30 MG/ML IJ SOLN: 30.0000 mg | Freq: Once | INTRAMUSCULAR | Status: DC

## 2020-11-20 MED ORDER — METHYLPREDNISOLONE 4 MG PO TABS: ORAL_TABLET | ORAL | 0 refills | Status: DC

## 2020-11-20 MED ORDER — METHOCARBAMOL 500 MG PO TABS
500.0000 mg | ORAL_TABLET | Freq: Three times a day (TID) | ORAL | 0 refills | Status: DC | PRN
Start: 2020-11-20 — End: 2020-11-20

## 2020-11-20 NOTE — Progress Notes (Signed)
Per Zonia Kief, PA, patient was given an IM injection of Toradol 30mg  in the right glute and an IM injection of Depo 40mg  in the left glute.  Patient tolerated both IM injections very well.

## 2020-11-20 NOTE — Progress Notes (Signed)
Office Visit Note   Patient: Darin Hart           Date of Birth: [birth year]           MRN: [id number] Visit Date: 10/30/2020              Requested by: Everrett Coombe, DO [address]  [address] [place],  [place] [address] PCP: Georgina Quint, MD   Assessment & Plan: Visit Diagnoses:  1. Chronic bilateral low back pain, unspecified whether sciatica present   2. Spondylolisthesis, lumbar region     Plan: We will attend conservative management.  Patient will try to modify his activity.  Can try over-the-counter oral NSAID.  May consider Medrol Dosepak in the future.  Follow-up in 3 weeks for recheck.  Return sooner if needed.  Follow-Up Instructions: Return in about 3 weeks (around 11/20/2020) for with [facility] for recheck lumbar.   Orders:  Orders Placed This Encounter  Procedures   XR Lumbar Spine 2-3 Views   No orders of the defined types were placed in this encounter.     Procedures: No procedures performed   Clinical Data: No additional findings.   Subjective: Chief Complaint  Patient presents with   Lower Back - Pain    HPI 50 year old black male comes in today with complaints of chronic low back pain.  States that he has back problems for 10+ years.  Previous MRI scan and he had lumbar ESI when living in [place].  Currently states that the pain is across his low back and no lower extremity radiculopathy.  Pain with just about all activity.  Is active with working out but this is becoming more difficult.  States that he is use muscle relaxers and over-the-counter medication.  No complaints of bowel or bladder incontinence. Review of Systems No current cardiac pulmonary GI GU issues  Objective: Vital Signs: BP 120/75   Pulse 83   Ht 6' (1.829 m)   Wt 187 lb (84.8 kg)   BMI 25.36 kg/m   Physical Exam HENT:     Head: Normocephalic and atraumatic.  Eyes:     Extraocular Movements: Extraocular movements intact.  Pulmonary:      Effort: Pulmonary effort is normal.  Musculoskeletal:     Comments: Gait is normal.  He has some pain with lumbar extension.  Lumbar flexion hands and knees without discomfort.  Negative log about his.  Negative straight leg raise.  Neurovas intact.  No focal motor deficits.  Neurological:     Mental Status: He is alert and oriented to person, place, and time.  Psychiatric:        Mood and Affect: Mood normal.    Ortho Exam  Specialty Comments:  No specialty comments available.  Imaging: No results found.   PMFS History: Patient Active Problem List   Diagnosis Date Noted   Chronic musculoskeletal pain 10/02/2020   History of asthma 10/02/2020   History of multiple allergies 10/02/2020   Low testosterone 02/07/2018   Screening for prostate cancer 11/28/2017   Past Medical History:  Diagnosis Date   Arthritis    Asthma    Heart murmur     Family History  Problem Relation Age of Onset   Other Neg Hx        hypogonadism    Past Surgical History:  Procedure Laterality Date   HERNIA REPAIR     LEG SURGERY     RHINOPLASTY     spontaneous  neurothroax     Social History   Occupational History   Not on file  Tobacco Use   Smoking status: Never   Smokeless tobacco: Never  Vaping Use   Vaping Use: Never used  Substance and Sexual Activity   Alcohol use: Yes    Comment: OCCASIONALLY   Drug use: Never   Sexual activity: Yes    Partners: Female

## 2020-11-20 NOTE — Progress Notes (Signed)
Office Visit Note   Patient: Darin Hart           Date of Birth: February 08, 1971           MRN: [id number] Visit Date: 11/20/2020              Requested by: Georgina Quint, MD [address] [place],  [place] [address] PCP: Georgina Quint, MD   Assessment & Plan: Visit Diagnoses:  1. Radiculopathy, lumbar region     Plan: I will have patient follow-up with me in 1 week for recheck.  I did ask him to call in the day before his appointment to let me know how he is feeling.  If he has not had any improvement with medications I will plan to get a lumbar MRI to rule out HNP/stenosis.  Advised patient to take it easy over the next several days.  I wanted to take him out of work for the rest of this week but he stated that he wanted to return and will be careful.  All questions answered.  Follow-Up Instructions: Return in about 1 week (around 11/27/2020) for With Fayrene Fearing for recheck.   Orders:  No orders of the defined types were placed in this encounter.  Meds ordered this encounter  Medications   methylPREDNISolone acetate (DEPO-MEDROL) injection 40 mg   ketorolac (TORADOL) 30 MG/ML injection 30 mg   methylPREDNISolone (MEDROL) 4 MG tablet    Sig: 6-day taper to be taken as directed    Dispense:  21 tablet    Refill:  0   methocarbamol (ROBAXIN) 500 MG tablet    Sig: Take 1 tablet (500 mg total) by mouth every 8 (eight) hours as needed for muscle spasms.    Dispense:  50 tablet    Refill:  0       Procedures: No procedures performed   Clinical Data: No additional findings.   Subjective: Chief Complaint  Patient presents with   Lower Back - Follow-up    HPI 50 year old black male returns for recheck of his low back pain.  He has been taking naproxen and using pain patches.  He had some improvement but aggravated his back again when he went to the gym.  States that his pain is somewhat worse than what it was before.  Left-sided low back pain that  radiates into the left buttock.  Increase discomfort when he is ambulating, bending, twisting. Review of Systems No current cardiac pulmonary GI GU issues  Objective: Vital Signs: BP 127/86   Pulse 84   Ht 6' (1.829 m)   Wt 187 lb (84.8 kg)   BMI 25.36 kg/m   Physical Exam HENT:     Head: Normocephalic.  Eyes:     Extraocular Movements: Extraocular movements intact.  Pulmonary:     Effort: No respiratory distress.  Musculoskeletal:     Comments: Gait is antalgic.  Decreased lumbar flexion extension due to pain.  Positive left-sided notch tenderness.  Positive left lumbar paraspinal tenderness.  Negative on the right side.  Pain with bilateral straight leg raise.  Negative logroll.  Bilateral calves nontender.  Neurological:     General: No focal deficit present.     Mental Status: He is alert and oriented to person, place, and time.  Psychiatric:        Mood and Affect: Mood normal.    Ortho Exam  Specialty Comments:  No specialty comments available.  Imaging: No results found.   PMFS  History: Patient Active Problem List   Diagnosis Date Noted   Chronic musculoskeletal pain 10/02/2020   History of asthma 10/02/2020   History of multiple allergies 10/02/2020   Low testosterone 02/07/2018   Screening for prostate cancer 11/28/2017   Past Medical History:  Diagnosis Date   Arthritis    Asthma    Heart murmur     Family History  Problem Relation Age of Onset   Other Neg Hx        hypogonadism    Past Surgical History:  Procedure Laterality Date   HERNIA REPAIR     LEG SURGERY     RHINOPLASTY     spontaneous neurothroax     Social History   Occupational History   Not on file  Tobacco Use   Smoking status: Never   Smokeless tobacco: Never  Vaping Use   Vaping Use: Never used  Substance and Sexual Activity   Alcohol use: Yes    Comment: OCCASIONALLY   Drug use: Never   Sexual activity: Yes    Partners: Female

## 2020-11-27 ENCOUNTER — Other Ambulatory Visit: Payer: Self-pay

## 2020-11-27 ENCOUNTER — Encounter: Payer: Self-pay | Admitting: Surgery

## 2020-11-27 ENCOUNTER — Ambulatory Visit (INDEPENDENT_AMBULATORY_CARE_PROVIDER_SITE_OTHER): Payer: 59 | Admitting: Surgery

## 2020-11-27 VITALS — BP 121/79 | HR 80 | Ht 72.0 in | Wt 187.0 lb

## 2020-11-27 DIAGNOSIS — M5416 Radiculopathy, lumbar region: Secondary | ICD-10-CM | POA: Diagnosis not present

## 2020-11-27 NOTE — Progress Notes (Signed)
50 year old black male returns recheck of his low back pain.  States that he did have some relief with medications that were given last office visit.  States that when the rain came his back pain returned.   Exam Pleasant black male alert and oriented in no acute stress.  Gait is normal.  He has lumbar paraspinal tenderness.  Negative straight leg raise.  Negative logroll.  Plan With patient's ongoing symptoms I will schedule him for a lumbar MRI to rule out HNP/stenosis.  He has failed conservative treatment up to this point.  Follow-up with Dr. Ophelia Charter in 3 weeks for recheck and to review study.

## 2020-12-07 ENCOUNTER — Other Ambulatory Visit: Payer: 59

## 2020-12-09 ENCOUNTER — Other Ambulatory Visit: Payer: Self-pay

## 2020-12-09 ENCOUNTER — Encounter: Payer: Self-pay | Admitting: Emergency Medicine

## 2020-12-09 ENCOUNTER — Ambulatory Visit (INDEPENDENT_AMBULATORY_CARE_PROVIDER_SITE_OTHER): Payer: 59 | Admitting: Emergency Medicine

## 2020-12-09 VITALS — BP 110/70 | HR 101 | Temp 98.3°F | Ht 72.0 in | Wt 190.4 lb

## 2020-12-09 DIAGNOSIS — Z1211 Encounter for screening for malignant neoplasm of colon: Secondary | ICD-10-CM | POA: Diagnosis not present

## 2020-12-09 DIAGNOSIS — I498 Other specified cardiac arrhythmias: Secondary | ICD-10-CM | POA: Diagnosis not present

## 2020-12-09 DIAGNOSIS — Z0001 Encounter for general adult medical examination with abnormal findings: Secondary | ICD-10-CM

## 2020-12-09 DIAGNOSIS — R6889 Other general symptoms and signs: Secondary | ICD-10-CM | POA: Diagnosis not present

## 2020-12-09 DIAGNOSIS — R9431 Abnormal electrocardiogram [ECG] [EKG]: Secondary | ICD-10-CM

## 2020-12-09 NOTE — Patient Instructions (Signed)
Health Maintenance, Male Adopting a healthy lifestyle and getting preventive care are important in promoting health and wellness. Ask your health care provider about: The right schedule for you to have regular tests and exams. Things you can do on your own to prevent diseases and keep yourself healthy. What should I know about diet, weight, and exercise? Eat a healthy diet  Eat a diet that includes plenty of vegetables, fruits, low-fat dairy products, and lean protein. Do not eat a lot of foods that are high in solid fats, added sugars, or sodium.  Maintain a healthy weight Body mass index (BMI) is a measurement that can be used to identify possible weight problems. It estimates body fat based on height and weight. Your health care provider can help determine your BMI and help you achieve or maintain ahealthy weight. Get regular exercise Get regular exercise. This is one of the most important things you can do for your health. Most adults should: Exercise for at least 150 minutes each week. The exercise should increase your heart rate and make you sweat (moderate-intensity exercise). Do strengthening exercises at least twice a week. This is in addition to the moderate-intensity exercise. Spend less time sitting. Even light physical activity can be beneficial. Watch cholesterol and blood lipids Have your blood tested for lipids and cholesterol at 50 years of age, then havethis test every 5 years. You may need to have your cholesterol levels checked more often if: Your lipid or cholesterol levels are high. You are older than 50 years of age. You are at high risk for heart disease. What should I know about cancer screening? Many types of cancers can be detected early and may often be prevented. Depending on your health history and family history, you may need to have cancer screening at various ages. This may include screening for: Colorectal cancer. Prostate cancer. Skin cancer. Lung  cancer. What should I know about heart disease, diabetes, and high blood pressure? Blood pressure and heart disease High blood pressure causes heart disease and increases the risk of stroke. This is more likely to develop in people who have high blood pressure readings, are of African descent, or are overweight. Talk with your health care provider about your target blood pressure readings. Have your blood pressure checked: Every 3-5 years if you are 18-39 years of age. Every year if you are 40 years old or older. If you are between the ages of 65 and 75 and are a current or former smoker, ask your health care provider if you should have a one-time screening for abdominal aortic aneurysm (AAA). Diabetes Have regular diabetes screenings. This checks your fasting blood sugar level. Have the screening done: Once every three years after age 45 if you are at a normal weight and have a low risk for diabetes. More often and at a younger age if you are overweight or have a high risk for diabetes. What should I know about preventing infection? Hepatitis B If you have a higher risk for hepatitis B, you should be screened for this virus. Talk with your health care provider to find out if you are at risk forhepatitis B infection. Hepatitis C Blood testing is recommended for: Everyone born from 1945 through 1965. Anyone with known risk factors for hepatitis C. Sexually transmitted infections (STIs) You should be screened each year for STIs, including gonorrhea and chlamydia, if: You are sexually active and are younger than 50 years of age. You are older than 50 years of age   and your health care provider tells you that you are at risk for this type of infection. Your sexual activity has changed since you were last screened, and you are at increased risk for chlamydia or gonorrhea. Ask your health care provider if you are at risk. Ask your health care provider about whether you are at high risk for HIV.  Your health care provider may recommend a prescription medicine to help prevent HIV infection. If you choose to take medicine to prevent HIV, you should first get tested for HIV. You should then be tested every 3 months for as long as you are taking the medicine. Follow these instructions at home: Lifestyle Do not use any products that contain nicotine or tobacco, such as cigarettes, e-cigarettes, and chewing tobacco. If you need help quitting, ask your health care provider. Do not use street drugs. Do not share needles. Ask your health care provider for help if you need support or information about quitting drugs. Alcohol use Do not drink alcohol if your health care provider tells you not to drink. If you drink alcohol: Limit how much you have to 0-2 drinks a day. Be aware of how much alcohol is in your drink. In the [place] one drink equals one 12 oz bottle of beer (355 mL), one 5 oz glass of wine (148 mL), or one 1 oz glass of hard liquor (44 mL). General instructions Schedule regular health, dental, and eye exams. Stay current with your vaccines. Tell your health care provider if: You often feel depressed. You have ever been abused or do not feel safe at home. Summary Adopting a healthy lifestyle and getting preventive care are important in promoting health and wellness. Follow your health care provider's instructions about healthy diet, exercising, and getting tested or screened for diseases. Follow your health care provider's instructions on monitoring your cholesterol and blood pressure. This information is not intended to replace advice given to you by your health care provider. Make sure you discuss any questions you have with your healthcare provider. Document Revised: 05/03/2018 Document Reviewed: 05/03/2018 Elsevier Patient Education  2022 Elsevier Inc.  

## 2020-12-09 NOTE — Progress Notes (Signed)
Darin Hart 50 y.o.   Chief Complaint  Patient presents with   Annual Exam    HISTORY OF PRESENT ILLNESS: This is a 50 y.o. male here for his annual exam.  HPI   Prior to Admission medications   Medication Sig Start Date End Date Taking? Authorizing Provider  albuterol (VENTOLIN HFA) 108 (90 Base) MCG/ACT inhaler Inhale 2 puffs into the lungs every 6 (six) hours as needed for wheezing or shortness of breath. 10/02/20  Yes Meridee Branum, Eilleen Kempf, MD  methocarbamol (ROBAXIN) 500 MG tablet Take 1 tablet (500 mg total) by mouth every 8 (eight) hours as needed for muscle spasms. 11/20/20  Yes Naida Sleight, PA-C  Multiple Vitamin (MULTIVITAMIN WITH MINERALS) TABS tablet Take 1 tablet by mouth daily.   Yes [provider]  Omega-3 Fatty Acids (FISH OIL BURP-LESS PO) Take 2 capsules by mouth daily.   Yes [provider]  cyclobenzaprine (FLEXERIL) 10 MG tablet TAKE 1 TABLET(10 MG) BY MOUTH THREE TIMES DAILY AS NEEDED FOR MUSCLE SPASMS Patient not taking: Reported on 12/09/2020 05/08/19   Everrett Coombe, DO  ibuprofen (ADVIL,MOTRIN) 200 MG tablet Take 200 mg by mouth every 6 (six) hours as needed for moderate pain. Patient not taking: No sig reported    [provider]    Allergies  Allergen Reactions   Shellfish Allergy Anaphylaxis   Other Other (See Comments)    Migraines, itchy tongue, burning mouth All Tree Nuts   Penicillins     Pt was told he could not take this, his reaction is unknown Has patient had a PCN reaction causing immediate rash, facial/tongue/throat swelling, SOB or lightheadedness with hypotension: Unknown Has patient had a PCN reaction causing severe rash involving mucus membranes or skin necrosis: Unknown Has patient had a PCN reaction that required hospitalization: NO Has patient had a PCN reaction occurring within the last 10 years: NO If all of the above answers are "NO", then may proceed with Cephalospo    Patient Active  Problem List   Diagnosis Date Noted   Chronic musculoskeletal pain 10/02/2020   History of asthma 10/02/2020   History of multiple allergies 10/02/2020   Low testosterone 02/07/2018   Screening for prostate cancer 11/28/2017    Past Medical History:  Diagnosis Date   Arthritis    Asthma    Heart murmur     Past Surgical History:  Procedure Laterality Date   HERNIA REPAIR     LEG SURGERY     RHINOPLASTY     spontaneous neurothroax      Social History   Socioeconomic History   Marital status: Married    Spouse name: Not on file   Number of children: Not on file   Years of education: Not on file   Highest education level: Not on file  Occupational History   Not on file  Tobacco Use   Smoking status: Never   Smokeless tobacco: Never  Vaping Use   Vaping Use: Never used  Substance and Sexual Activity   Alcohol use: Yes    Comment: OCCASIONALLY   Drug use: Never   Sexual activity: Yes    Partners: Female  Other Topics Concern   Not on file  Social History Narrative   Not on file   Social Determinants of Health   Financial Resource Strain: Not on file  Food Insecurity: Not on file  Transportation Needs: Not on file  Physical Activity: Not on file  Stress: Not on file  Social Connections: Not on file  Intimate Partner Violence: Not on file    Family History  Problem Relation Age of Onset   Other Neg Hx        hypogonadism     Review of Systems  Constitutional: Negative.  Negative for chills and fever.  HENT: Negative.  Negative for congestion and sore throat.   Respiratory: Negative.  Negative for cough and shortness of breath.   Cardiovascular: Negative.  Negative for chest pain and palpitations.  Gastrointestinal:  Negative for abdominal pain, diarrhea, nausea and vomiting.  Genitourinary: Negative.  Negative for dysuria and hematuria.  Skin: Negative.  Negative for rash.  Neurological:  Negative for dizziness and headaches.  All other systems  reviewed and are negative.  Today's Vitals   12/09/20 1603  BP: 110/70  Pulse: (!) 101  Temp: 98.3 F (36.8 C)  TempSrc: Oral  SpO2: 97%  Weight: 190 lb 6.4 oz (86.4 kg)  Height: 6' (1.829 m)   Body mass index is 25.82 kg/m.  Physical Exam Vitals reviewed.  Constitutional:      Appearance: Normal appearance.  HENT:     Head: Normocephalic.  Neurological:     Mental Status: He is alert.    EKG: Sinus rhythm with marked sinus arrhythmia and ventricular rate of 90/min. LAE.  No acute ischemic changes.  No old EKG to compare with.  ASSESSMENT & PLAN: Darin Hart was seen today for annual exam.  Diagnoses and all orders for this visit:  Abnormal findings on auscultation -     EKG 12-Lead  Colon cancer screening -     Ambulatory referral to Gastroenterology  Sinus arrhythmia -     Ambulatory referral to Cardiology  Abnormal EKG -     Ambulatory referral to Cardiology  Encounter for general adult medical examination with abnormal findings  Modifiable risk factors discussed with patient. Anticipatory guidance according to age provided. The following topics were also discussed: Social Determinants of Health Smoking Diet and nutrition Benefits of exercise Cancer screening and need for colon cancer screening with colonoscopy Vaccinations recommendations Cardiovascular risk assessment Abnormal EKG and need for cardiology evaluation Mental health including depression and anxiety Fall and accident prevention  Patient Instructions  Health Maintenance, Male Adopting a healthy lifestyle and getting preventive care are important in promoting health and wellness. Ask your health care provider about: The right schedule for you to have regular tests and exams. Things you can do on your own to prevent diseases and keep yourself healthy. What should I know about diet, weight, and exercise? Eat a healthy diet  Eat a diet that includes plenty of vegetables, fruits, low-fat  dairy products, and lean protein. Do not eat a lot of foods that are high in solid fats, added sugars, or sodium.  Maintain a healthy weight Body mass index (BMI) is a measurement that can be used to identify possible weight problems. It estimates body fat based on height and weight. Your health care provider can help determine your BMI and help you achieve or maintain ahealthy weight. Get regular exercise Get regular exercise. This is one of the most important things you can do for your health. Most adults should: Exercise for at least 150 minutes each week. The exercise should increase your heart rate and make you sweat (moderate-intensity exercise). Do strengthening exercises at least twice a week. This is in addition to the moderate-intensity exercise. Spend less time sitting. Even light physical activity can be beneficial. Watch cholesterol and blood  lipids Have your blood tested for lipids and cholesterol at 50 years of age, then havethis test every 5 years. You may need to have your cholesterol levels checked more often if: Your lipid or cholesterol levels are high. You are older than 50 years of age. You are at high risk for heart disease. What should I know about cancer screening? Many types of cancers can be detected early and may often be prevented. Depending on your health history and family history, you may need to have cancer screening at various ages. This may include screening for: Colorectal cancer. Prostate cancer. Skin cancer. Lung cancer. What should I know about heart disease, diabetes, and high blood pressure? Blood pressure and heart disease High blood pressure causes heart disease and increases the risk of stroke. This is more likely to develop in people who have high blood pressure readings, are of African descent, or are overweight. Talk with your health care provider about your target blood pressure readings. Have your blood pressure checked: Every 3-5 years if  you are 65-11 years of age. Every year if you are 15 years old or older. If you are between the ages of 67 and 40 and are a current or former smoker, ask your health care provider if you should have a one-time screening for abdominal aortic aneurysm (AAA). Diabetes Have regular diabetes screenings. This checks your fasting blood sugar level. Have the screening done: Once every three years after age 12 if you are at a normal weight and have a low risk for diabetes. More often and at a younger age if you are overweight or have a high risk for diabetes. What should I know about preventing infection? Hepatitis B If you have a higher risk for hepatitis B, you should be screened for this virus. Talk with your health care provider to find out if you are at risk forhepatitis B infection. Hepatitis C Blood testing is recommended for: Everyone born from [birth year] through 1965. Anyone with known risk factors for hepatitis C. Sexually transmitted infections (STIs) You should be screened each year for STIs, including gonorrhea and chlamydia, if: You are sexually active and are younger than 50 years of age. You are older than 49 years of age and your health care provider tells you that you are at risk for this type of infection. Your sexual activity has changed since you were last screened, and you are at increased risk for chlamydia or gonorrhea. Ask your health care provider if you are at risk. Ask your health care provider about whether you are at high risk for HIV. Your health care provider may recommend a prescription medicine to help prevent HIV infection. If you choose to take medicine to prevent HIV, you should first get tested for HIV. You should then be tested every 3 months for as long as you are taking the medicine. Follow these instructions at home: Lifestyle Do not use any products that contain nicotine or tobacco, such as cigarettes, e-cigarettes, and chewing tobacco. If you need help quitting, ask  your health care provider. Do not use street drugs. Do not share needles. Ask your health care provider for help if you need support or information about quitting drugs. Alcohol use Do not drink alcohol if your health care provider tells you not to drink. If you drink alcohol: Limit how much you have to 0-2 drinks a day. Be aware of how much alcohol is in your drink. In the [place] one drink equals one 12 oz bottle  of beer (355 mL), one 5 oz glass of wine (148 mL), or one 1 oz glass of hard liquor (44 mL). General instructions Schedule regular health, dental, and eye exams. Stay current with your vaccines. Tell your health care provider if: You often feel depressed. You have ever been abused or do not feel safe at home. Summary Adopting a healthy lifestyle and getting preventive care are important in promoting health and wellness. Follow your health care provider's instructions about healthy diet, exercising, and getting tested or screened for diseases. Follow your health care provider's instructions on monitoring your cholesterol and blood pressure. This information is not intended to replace advice given to you by your health care provider. Make sure you discuss any questions you have with your healthcare provider. Document Revised: 05/03/2018 Document Reviewed: 05/03/2018 Elsevier Patient Education  2022 [employer] Inc.   Edwina Barth, MD [facility]

## 2020-12-24 ENCOUNTER — Other Ambulatory Visit: Payer: Self-pay | Admitting: Radiology

## 2020-12-24 DIAGNOSIS — M5416 Radiculopathy, lumbar region: Secondary | ICD-10-CM

## 2021-01-09 ENCOUNTER — Other Ambulatory Visit: Payer: Self-pay

## 2021-01-09 ENCOUNTER — Ambulatory Visit (INDEPENDENT_AMBULATORY_CARE_PROVIDER_SITE_OTHER): Payer: 59 | Admitting: Rehabilitative and Restorative Service Providers"

## 2021-01-09 ENCOUNTER — Encounter: Payer: Self-pay | Admitting: Rehabilitative and Restorative Service Providers"

## 2021-01-09 DIAGNOSIS — G8929 Other chronic pain: Secondary | ICD-10-CM

## 2021-01-09 DIAGNOSIS — M5416 Radiculopathy, lumbar region: Secondary | ICD-10-CM

## 2021-01-09 DIAGNOSIS — M5442 Lumbago with sciatica, left side: Secondary | ICD-10-CM

## 2021-01-09 NOTE — Therapy (Addendum)
[facility] [address] [place], [place], [address] Phone: [phone]   Fax:  [phone]  Physical Therapy Evaluation /Discharge  Patient Details  Name: Darin Hart MRN: [id number] Date of Birth: [birth year] Referring Provider (PT): Lanae Crumbly PA-C   Encounter Date: 01/09/2021   PT End of Session - 01/09/21 1608     Visit Number 1    Number of Visits 1    PT Start Time 8177    PT Stop Time 1165    PT Time Calculation (min) 23 min    Activity Tolerance No increased pain;Patient tolerated treatment well    Behavior During Therapy [facility] for tasks assessed/performed             Past Medical History:  Diagnosis Date   Arthritis    Asthma    Heart murmur     Past Surgical History:  Procedure Laterality Date   HERNIA REPAIR     LEG SURGERY     RHINOPLASTY     spontaneous neurothroax      There were no vitals filed for this visit.    Subjective Assessment - 01/09/21 1538     Subjective Lumbar HNP.  Has had pain management.  Intermittent back pain and L scaitica.  Would like to get imaging before starting PT.    Pertinent History Long-term LBP with off and on sciatica (L).    How long can you sit comfortably? 6 hours    How long can you stand comfortably? Depends on the day    How long can you walk comfortably? Miles    Patient Stated Goals Get imaging before anything else.    Currently in Pain? No/denies    Multiple Pain Sites No                [facility] PT Assessment - 01/09/21 0001       Assessment   Medical Diagnosis Lumbar radiculopathy    Referring Provider (PT) Lanae Crumbly PA-C    Onset Date/Surgical Date --   Chronic     Restrictions   Weight Bearing Restrictions No      Balance Screen   Has the patient fallen in the past 6 months No    Has the patient had a decrease in activity level because of a fear of falling?  No    Is the patient reluctant to leave their home because of a fear of falling?   No      Home Ecologist residence    Additional Comments Stairs no problem      Prior Programmer, systems    Leisure Gym      Cognition   Overall Cognitive Status Within Functional Limits for tasks assessed      ROM / Strength   AROM / PROM / Strength AROM      AROM   Overall AROM  Deficits    AROM Assessment Site Lumbar    Lumbar Extension 10 (Goal 20-25)                        Objective measurements completed on examination: See above findings.       [facility] [facility] PT Treatment/Exercise - 01/09/21 0001       Therapeutic Activites    Therapeutic Activities Other Therapeutic Activities    Other Therapeutic Activities Reviewed exam findings, basic spine anatomy and starter HEP  Exercises   Exercises Lumbar      Lumbar Exercises: Stretches   Standing Extension 10 reps;Limitations    Standing Extension Limitations 3 seconds (push hips forward)                    PT Education - 01/09/21 1608     Education Details Reviewed HEP and 1 exercise.    Person(s) Educated Patient    Methods Explanation;Demonstration;Verbal cues;Handout    Comprehension Verbal cues required;Returned demonstration;Verbalized understanding                 PT Long Term Goals - 01/09/21 1611       PT LONG TERM GOAL #1   Title Gerald Stabs will be independent with his day 1 HEP.    Status Achieved                    Plan - 01/09/21 1609     Clinical Impression Statement Gerald Stabs has long standing LBP with intermittent L sciatica.  He would like to get imaging done before any other PT.    Personal Factors and Comorbidities Time since onset of injury/illness/exacerbation    Stability/Clinical Decision Making Stable/Uncomplicated    Clinical Decision Making Low    Rehab Potential Good    PT Frequency Other (comment)   1X visit per patient choice   PT Treatment/Interventions Therapeutic  exercise;Patient/family education    PT Next Visit Plan If requested, spine strength    PT Home Exercise Plan Access Code: [email or web address]  URL: [email or web address]  Date: 01/09/2021  Prepared by: Vista Mink    Exercises  Standing Lumbar Extension at [place] 5 x daily - 7 x weekly - 1 sets - 5 reps - 3 seconds hold    Consulted and Agree with Plan of Care Patient             Patient will benefit from skilled therapeutic intervention in order to improve the following deficits and impairments:  Decreased range of motion, Pain  Visit Diagnosis: Chronic bilateral low back pain with left-sided sciatica  Radiculopathy, lumbar region     Problem List Patient Active Problem List   Diagnosis Date Noted   Chronic musculoskeletal pain 10/02/2020   History of asthma 10/02/2020   History of multiple allergies 10/02/2020   Low testosterone 02/07/2018   Screening for prostate cancer 11/28/2017    Farley Ly PT, MPT 01/09/2021, 4:15 PM   PHYSICAL THERAPY DISCHARGE SUMMARY  Visits from Start of Care: 1  Current functional level related to goals / functional outcomes: See note   Remaining deficits: See note   Education / Equipment: HEP   Patient goals were not met. Patient is being discharged due to not returning since the last visit.  Scot Jun, PT, DPT, OCS, ATC 10/23/21  9:33 AM    [facility] [address] [place], [place], [address] Phone: [phone]   Fax:  [phone]  Name: Darin Hart MRN: [id number] Date of Birth: 1970-11-07

## 2021-01-09 NOTE — Patient Instructions (Signed)
Access Code: [email or web address] URL: [email or web address] Date: 01/09/2021 Prepared by: Pauletta Browns  Exercises Standing Lumbar Extension at Wall - Forearms - 5 x daily - 7 x weekly - 1 sets - 5 reps - 3 seconds hold

## 2021-01-21 ENCOUNTER — Encounter: Payer: Self-pay | Admitting: Orthopaedic Surgery

## 2021-01-21 ENCOUNTER — Other Ambulatory Visit: Payer: Self-pay

## 2021-01-21 ENCOUNTER — Ambulatory Visit (INDEPENDENT_AMBULATORY_CARE_PROVIDER_SITE_OTHER): Payer: 59 | Admitting: Orthopaedic Surgery

## 2021-01-21 DIAGNOSIS — M545 Low back pain, unspecified: Secondary | ICD-10-CM | POA: Diagnosis not present

## 2021-01-21 NOTE — Progress Notes (Signed)
Office Visit Note   Patient: Darin Hart           Date of Birth: 09/02/1970           MRN: [id number] Visit Date: 01/21/2021              Requested by: Georgina Quint, MD [address] [place],  [place] [address] PCP: Georgina Quint, MD   Assessment & Plan: Visit Diagnoses:  1. Low back pain without sciatica, unspecified back pain laterality, unspecified chronicity     Plan: We discussed modification of workout activities as far as weight and technique to avoid overloadings.  Back symptoms are significantly improved home exercise program which she is continuing.  He can follow-up if he has increased symptoms.   Follow-Up Instructions: Return if symptoms worsen or fail to improve.   Orders:  No orders of the defined types were placed in this encounter.  No orders of the defined types were placed in this encounter.     Procedures: No procedures performed   Clinical Data: No additional findings.   Subjective: Chief Complaint  Patient presents with   Lower Back - Follow-up    HPI 50 year old male returns for follow-up for low back pain.  Patient had 1 therapy session and has been on home exercise program since that time.  He is not on any medication and states his pain is now 0.  He has had some discomfort in his shoulder works out regularly including free weights.  Through the years he is back down some on the total weight amount of lifting.  Describes discomfort with outstretched and overhead activities.  He has had treatment for rotator cuff problems on the opposite shoulder in the past.  Review of Systems positive history for asthma.  Low T.  All other systems noncontributory to HPI.   Objective: Vital Signs: BP 124/82 (BP Location: Left Arm, Patient Position: Sitting, Cuff Size: Large)   Pulse 84   SpO2 96%   Physical Exam Constitutional:      Appearance: He is well-developed.  HENT:     Head: Normocephalic and atraumatic.     Right  Ear: External ear normal.     Left Ear: External ear normal.  Eyes:     Pupils: Pupils are equal, round, and reactive to light.  Neck:     Thyroid: No thyromegaly.     Trachea: No tracheal deviation.  Cardiovascular:     Rate and Rhythm: Normal rate.  Pulmonary:     Effort: Pulmonary effort is normal.     Breath sounds: No wheezing.  Abdominal:     General: Bowel sounds are normal.     Palpations: Abdomen is soft.  Musculoskeletal:     Cervical back: Neck supple.  Skin:    General: Skin is warm and dry.     Capillary Refill: Capillary refill takes less than 2 seconds.  Neurological:     Mental Status: He is alert and oriented to person, place, and time.  Psychiatric:        Behavior: Behavior normal.        Thought Content: Thought content normal.        Judgment: Judgment normal.    Ortho Exam negative drop arm test minimal discomfort right shoulder impingement no brachial plexus tenderness negative Spurling.  Negative straight leg raising 90 degrees no sciatic notch tenderness.  Normal heel toe gait.  Specialty Comments:  No specialty comments available.  Imaging: No  results found.   PMFS History: Patient Active Problem List   Diagnosis Date Noted   Low back pain 01/23/2021   Chronic musculoskeletal pain 10/02/2020   History of asthma 10/02/2020   History of multiple allergies 10/02/2020   Low testosterone 02/07/2018   Screening for prostate cancer 11/28/2017   Past Medical History:  Diagnosis Date   Arthritis    Asthma    Heart murmur     Family History  Problem Relation Age of Onset   Other Neg Hx        hypogonadism    Past Surgical History:  Procedure Laterality Date   HERNIA REPAIR     LEG SURGERY     RHINOPLASTY     spontaneous neurothroax     Social History   Occupational History   Not on file  Tobacco Use   Smoking status: Never   Smokeless tobacco: Never  Vaping Use   Vaping Use: Never used  Substance and Sexual Activity   Alcohol  use: Yes    Comment: OCCASIONALLY   Drug use: Never   Sexual activity: Yes    Partners: Female

## 2021-01-23 DIAGNOSIS — M545 Low back pain, unspecified: Secondary | ICD-10-CM | POA: Insufficient documentation

## 2021-01-30 ENCOUNTER — Other Ambulatory Visit: Payer: Self-pay

## 2021-01-30 ENCOUNTER — Encounter: Payer: Self-pay | Admitting: Endocrinology

## 2021-01-30 ENCOUNTER — Ambulatory Visit (INDEPENDENT_AMBULATORY_CARE_PROVIDER_SITE_OTHER): Payer: 59 | Admitting: Endocrinology

## 2021-01-30 VITALS — BP 136/80 | HR 96 | Ht 72.0 in | Wt 186.0 lb

## 2021-01-30 DIAGNOSIS — R7989 Other specified abnormal findings of blood chemistry: Secondary | ICD-10-CM | POA: Diagnosis not present

## 2021-01-30 NOTE — Progress Notes (Signed)
Subjective:    Patient ID: Darin Hart, male    DOB: 1970/07/23, 50 y.o.   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  He has not recently taken any medication to affect the testosterone.  pt states he feels well in general.   Past Medical History:  Diagnosis Date   Arthritis    Asthma    Heart murmur     Past Surgical History:  Procedure Laterality Date   HERNIA REPAIR     LEG SURGERY     RHINOPLASTY     spontaneous neurothroax      Social History   Socioeconomic History   Marital status: Married    Spouse name: Not on file   Number of children: Not on file   Years of education: Not on file   Highest education level: Not on file  Occupational History   Not on file  Tobacco Use   Smoking status: Never   Smokeless tobacco: Never  Vaping Use   Vaping Use: Never used  Substance and Sexual Activity   Alcohol use: Yes    Comment: OCCASIONALLY   Drug use: Never   Sexual activity: Yes    Partners: Female  Other Topics Concern   Not on file  Social History Narrative   Not on file   Social Determinants of Health   Financial Resource Strain: Not on file  Food Insecurity: Not on file  Transportation Needs: Not on file  Physical Activity: Not on file  Stress: Not on file  Social Connections: Not on file  Intimate Partner Violence: Not on file    Current Outpatient Medications on File Prior to Visit  Medication Sig Dispense Refill   albuterol (VENTOLIN HFA) 108 (90 Base) MCG/ACT inhaler Inhale 2 puffs into the lungs every 6 (six) hours as needed for wheezing or shortness of breath. 18 g 3   methocarbamol (ROBAXIN) 500 MG tablet Take 1 tablet (500 mg total) by mouth every 8 (eight) hours as needed for muscle spasms. 50 tablet 0   Multiple Vitamin (MULTIVITAMIN WITH MINERALS) TABS tablet Take 1 tablet by mouth  daily.     Omega-3 Fatty Acids (FISH OIL BURP-LESS PO) Take 2 capsules by mouth daily.     Current Facility-Administered Medications on File Prior to Visit  Medication Dose Route Frequency Provider Last Rate Last Admin   methylPREDNISolone acetate (DEPO-MEDROL) injection 40 mg  40 mg Intramuscular Once Naida Sleight, PA-C          Family History  Problem Relation Age of Onset   Other Neg Hx        hypogonadism    BP 136/80 (BP Location: Left Arm, Patient Position: Sitting, Cuff Size: Normal)   Pulse 96   Ht 6' (1.829 m)   Wt 186 lb (84.4 kg)   SpO2 99%   BMI 25.23 kg/m    Review of Systems Denies ED sxs.     Objective:   Physical Exam VITAL SIGNS:  See vs page GENERAL: no distress NECK: There is no palpable thyroid enlargement.  No thyroid nodule is palpable.  No palpable lymphadenopathy at the anterior neck. EXT: no leg edema.     Lab Results  Component Value Date   TESTOSTERONE 236 (L) 01/30/2021   Lab Results  Component Value Date   PSA  1.73 10/21/2020       Assessment & Plan:  Hypogonadism: uncontrolled.  Androgen abuse, by hx: he is a poor candidate for rx to increase testosterone.  Patient Instructions  blood tests are requested for you today.  We'll let you know about the results. Testosterone treatment has risks, including increased or decreased fertility (depending on the type of treatment), hair loss, prostate cancer, benign prostate enlargement, blood clots, liver problems, lower hdl ("good cholesterol"), polycythemia (opposite of anemia), sleep apnea, and behavior changes.

## 2021-01-30 NOTE — Patient Instructions (Signed)
blood tests are requested for you today.  We'll let you know about the results. Testosterone treatment has risks, including increased or decreased fertility (depending on the type of treatment), hair loss, prostate cancer, benign prostate enlargement, blood clots, liver problems, lower hdl ("good cholesterol"), polycythemia (opposite of anemia), sleep apnea, and behavior changes.  

## 2021-02-04 LAB — TESTOSTERONE,FREE AND TOTAL
Testosterone, Free: 5.9 pg/mL — ABNORMAL LOW (ref 7.2–24.0)
Testosterone: 236 ng/dL — ABNORMAL LOW (ref 264–916)

## 2021-02-27 ENCOUNTER — Encounter: Payer: Self-pay | Admitting: Interventional Cardiology

## 2021-02-27 ENCOUNTER — Ambulatory Visit (INDEPENDENT_AMBULATORY_CARE_PROVIDER_SITE_OTHER): Payer: 59 | Admitting: Interventional Cardiology

## 2021-02-27 ENCOUNTER — Other Ambulatory Visit: Payer: Self-pay

## 2021-02-27 VITALS — BP 108/72 | HR 66 | Ht 72.0 in | Wt 183.8 lb

## 2021-02-27 DIAGNOSIS — R9431 Abnormal electrocardiogram [ECG] [EKG]: Secondary | ICD-10-CM | POA: Diagnosis not present

## 2021-02-27 DIAGNOSIS — I341 Nonrheumatic mitral (valve) prolapse: Secondary | ICD-10-CM

## 2021-02-27 NOTE — Patient Instructions (Signed)
Medication Instructions:  Your physician recommends that you continue on your current medications as directed. Please refer to the Current Medication list given to you today.  *If you need a refill on your cardiac medications before your next appointment, please call your pharmacy*   Lab Work: none If you have labs (blood work) drawn today and your tests are completely normal, you will receive your results only by: MyChart Message (if you have MyChart) OR A paper copy in the mail If you have any lab test that is abnormal or we need to change your treatment, we will call you to review the results.   Testing/Procedures: Your physician has requested that you have an echocardiogram. Echocardiography is a painless test that uses sound waves to create images of your heart. It provides your doctor with information about the size and shape of your heart and how well your heart's chambers and valves are working. This procedure takes approximately one hour. There are no restrictions for this procedure.    Follow-Up: At [facility], you and your health needs are our priority.  As part of our continuing mission to provide you with exceptional heart care, we have created designated Provider Care Teams.  These Care Teams include your primary Cardiologist (physician) and Advanced Practice Providers (APPs -  Physician Assistants and Nurse Practitioners) who all work together to provide you with the care you need, when you need it.  We recommend signing up for the patient portal called "MyChart".  Sign up information is provided on this After Visit Summary.  MyChart is used to connect with patients for Virtual Visits (Telemedicine).  Patients are able to view lab/test results, encounter notes, upcoming appointments, etc.  Non-urgent messages can be sent to your provider as well.   To learn more about what you can do with MyChart, go to [email or web address].    Your next appointment:   As needed based  on test results  The format for your next appointment:   In Person  Provider:   You may see Dr Eldridge Dace or one of the following Advanced Practice Providers on your designated Care Team:   Ronie Spies, PA-C Jacolyn Reedy, PA-C   Other Instructions

## 2021-02-27 NOTE — Progress Notes (Signed)
Cardiology Office Note   Date:  02/27/2021   ID:  Darin Hart, DOB [birth year], MRN [id number]  PCP:  Georgina Quint, MD    No chief complaint on file.  Abnormal ECG  Wt Readings from Last 3 Encounters:  02/27/21 183 lb 12.8 oz (83.4 kg)  01/30/21 186 lb (84.4 kg)  12/09/20 190 lb 6.4 oz (86.4 kg)       History of Present Illness: Darin Hart is a 50 y.o. male who is being seen today for the evaluation of abnormal ECG at the request of Georgina Quint, *.   He had a routine physical.  There was an irregularity noted on his ECG.  Denies : exertional Chest pain. Dizziness. Leg edema. Nitroglycerin use. Orthopnea.  Paroxysmal nocturnal dyspnea. Shortness of breath. Syncope.    Can have a right sided chest pain.  He feels some associated fluttering.    Works as a Industrial/product designer.    Past Medical History:  Diagnosis Date   Arthritis    Asthma    Heart murmur     Past Surgical History:  Procedure Laterality Date   HERNIA REPAIR     LEG SURGERY     RHINOPLASTY     spontaneous neurothroax       Current Outpatient Medications  Medication Sig Dispense Refill   albuterol (VENTOLIN HFA) 108 (90 Base) MCG/ACT inhaler Inhale 2 puffs into the lungs every 6 (six) hours as needed for wheezing or shortness of breath. 18 g 3   methocarbamol (ROBAXIN) 500 MG tablet Take 1 tablet (500 mg total) by mouth every 8 (eight) hours as needed for muscle spasms. 50 tablet 0   Multiple Vitamin (MULTIVITAMIN WITH MINERALS) TABS tablet Take 1 tablet by mouth daily.     Omega-3 Fatty Acids (FISH OIL BURP-LESS PO) Take 2 capsules by mouth daily.     Current Facility-Administered Medications  Medication Dose Route Frequency Provider Last Rate Last Admin   methylPREDNISolone acetate (DEPO-MEDROL) injection 40 mg  40 mg Intramuscular Once Naida Sleight, PA-C        Allergies:   Shellfish allergy, Other, and Penicillins    Social History:  The patient   reports that he has never smoked. He has never used smokeless tobacco. He reports current alcohol use. He reports that he does not use drugs.   Family History:  The patient's family history is not on file.    ROS:  Please see the history of present illness.   Otherwise, review of systems are positive for rare palpitations, similar to what his ECG looked like with PMD (skipped beats).   All other systems are reviewed and negative.    PHYSICAL EXAM: VS:  BP 108/72   Pulse 66   Ht 6' (1.829 m)   Wt 183 lb 12.8 oz (83.4 kg)   SpO2 98%   BMI 24.93 kg/m  , BMI Body mass index is 24.93 kg/m. GEN: Well nourished, well developed, in no acute distress HEENT: normal Neck: no JVD, carotid bruits, or masses Cardiac: RRR; faint, short, early systolic murmur, no rubs, or gallops,no edema  Respiratory:  clear to auscultation bilaterally, normal work of breathing GI: soft, nontender, nondistended, + BS MS: no deformity or atrophy Skin: warm and dry, no rash Neuro:  Strength and sensation are intact Psych: euthymic mood, full affect   EKG:   The ekg ordered today demonstrates NSR, no ST changes   Recent Labs: 10/02/2020: ALT 34;  BUN 23; Creatinine, Ser 1.24; Hemoglobin 14.2; Platelets 176.0; Potassium 4.2; Sodium 141; TSH 1.32   Lipid Panel    Component Value Date/Time   CHOL 159 10/02/2020 0912   TRIG 138.0 10/02/2020 0912   HDL 51.70 10/02/2020 0912   CHOLHDL 3 10/02/2020 0912   VLDL 27.6 10/02/2020 0912   LDLCALC 80 10/02/2020 0912     Other studies Reviewed: Additional studies/ records that were reviewed today with results demonstrating: labs reviewed, LDL 80 in 09/2020.   ASSESSMENT AND PLAN:  Abnormal ECG: Irregular rhythm.  There are clear P waves but evidence of atrial enlargement.  Plan for echocardiogram to evaluate for structural heart disease. Sinus arrhythmia: The automated ECG reading: Sinus arrhythmia.  I think this is more likely PACs and blocked PACs causing the  irregularity.  There is also some evidence of atrial enlargement.  Arrhythmia resolved on today's ECG. Tobacco abuse: He needs to stop smoking and avoid all tobacco products. Mitral valve prolapse: Noted in the past while in [place].  Need to check echo which has not been done in many years.    Current medicines are reviewed at length with the patient today.  The patient concerns regarding his medicines were addressed.  The following changes have been made:  No change  Labs/ tests ordered today include:  No orders of the defined types were placed in this encounter.   Recommend 150 minutes/week of aerobic exercise Low fat, low carb, high fiber diet recommended  Disposition:   FU based on echo   Signed, Lance Muss, MD  02/27/2021 2:31 PM    [facility] [address] [place], [place], [place]  [address] Phone: [phone]; Fax: [phone]

## 2021-03-10 ENCOUNTER — Other Ambulatory Visit: Payer: Self-pay

## 2021-03-10 ENCOUNTER — Ambulatory Visit (INDEPENDENT_AMBULATORY_CARE_PROVIDER_SITE_OTHER): Payer: 59 | Admitting: Orthopaedic Surgery

## 2021-03-10 DIAGNOSIS — G8929 Other chronic pain: Secondary | ICD-10-CM | POA: Diagnosis not present

## 2021-03-10 DIAGNOSIS — M25511 Pain in right shoulder: Secondary | ICD-10-CM | POA: Diagnosis not present

## 2021-03-10 NOTE — Progress Notes (Signed)
Office Visit Note   Patient: Darin Hart           Date of Birth: 28-Jun-1970           MRN: [id number] Visit Date: 03/10/2021              Requested by: Georgina Quint, MD [address] [place],  [place] [address] PCP: Georgina Quint, MD   Assessment & Plan: Visit Diagnoses:  1. Chronic right shoulder pain     Plan: Impression is chronic right shoulder pain likely from rotator cuff tendinitis.  At this point, I would like to go ahead and order an MRI to further assess the rotator cuff due to the longevity of symptoms and lack of sustained improvement following the subacromial cortisone injection and shoulder exercises.  He will follow-up with [place] once this is been completed.  Call with concerns or questions in the meantime.  Follow-Up Instructions: Return for after MRI right shoulder.   Orders:  No orders of the defined types were placed in this encounter.  No orders of the defined types were placed in this encounter.     Procedures: No procedures performed   Clinical Data: No additional findings.   Subjective: Chief Complaint  Patient presents with   Right Shoulder - Follow-up    HPI patient is a pleasant 50 year old gentleman who comes in today with continued right shoulder pain.  He has been dealing with this for about 8 months after working out at the gym.  He was seen in our office in March of this past year where subacromial cortisone injection was performed.  He notes that the injection helped quite a bit but only lasted for about 2 weeks at which point he had returned to the gym.  His pain returned and has progressively worsened.  The pain he has is to the proximal deltoid.  Worse with sleeping on the right side as well as with forward flexion and internal rotation.  He denies any weakness to the right upper extremity.  He has been taking Tylenol and using ice and heat without significant relief.  Review of Systems as detailed in HPI.  All  others reviewed and are negative.   Objective: Vital Signs: There were no vitals taken for this visit.  Physical Exam well-developed well-nourished gentleman in no acute distress.  Alert and oriented x3.  Ortho Exam right shoulder exam reveals near full active range of motion all planes.  He has moderate pain with empty can testing.  No pain with speeds or O'Brien's test.  Full strength throughout.  He is neurovascular intact distally.  Specialty Comments:  No specialty comments available.  Imaging: No new imaging   PMFS History: Patient Active Problem List   Diagnosis Date Noted   Low back pain 01/23/2021   Chronic musculoskeletal pain 10/02/2020   History of asthma 10/02/2020   History of multiple allergies 10/02/2020   Low testosterone 02/07/2018   Screening for prostate cancer 11/28/2017   Past Medical History:  Diagnosis Date   Arthritis    Asthma    Heart murmur     Family History  Problem Relation Age of Onset   Hypertension Mother    Other Neg Hx        hypogonadism    Past Surgical History:  Procedure Laterality Date   HERNIA REPAIR     LEG SURGERY     RHINOPLASTY     spontaneous neurothroax     Social  History   Occupational History   Not on file  Tobacco Use   Smoking status: Never   Smokeless tobacco: Never  Vaping Use   Vaping Use: Never used  Substance and Sexual Activity   Alcohol use: Yes    Comment: OCCASIONALLY   Drug use: Never   Sexual activity: Yes    Partners: Female

## 2021-03-13 ENCOUNTER — Other Ambulatory Visit: Payer: Self-pay

## 2021-03-13 ENCOUNTER — Ambulatory Visit (HOSPITAL_COMMUNITY): Payer: 59 | Attending: Cardiology

## 2021-03-13 DIAGNOSIS — I341 Nonrheumatic mitral (valve) prolapse: Secondary | ICD-10-CM | POA: Insufficient documentation

## 2021-03-13 DIAGNOSIS — R9431 Abnormal electrocardiogram [ECG] [EKG]: Secondary | ICD-10-CM | POA: Diagnosis not present

## 2021-03-13 LAB — ECHOCARDIOGRAM COMPLETE
Area-P 1/2: 2.18 cm2
S' Lateral: 2.95 cm

## 2021-03-27 ENCOUNTER — Telehealth: Payer: Self-pay | Admitting: Orthopaedic Surgery

## 2021-03-27 NOTE — Telephone Encounter (Signed)
Called pt to get scheduled with Roda Shutters for mri follow up

## 2021-04-11 ENCOUNTER — Ambulatory Visit
Admission: RE | Admit: 2021-04-11 | Discharge: 2021-04-11 | Disposition: A | Discharge status: Home / Self Care | Payer: 59 | Source: Ambulatory Visit | Attending: Orthopaedic Surgery | Admitting: Orthopaedic Surgery

## 2021-04-11 ENCOUNTER — Other Ambulatory Visit: Payer: Self-pay

## 2021-04-11 DIAGNOSIS — M25511 Pain in right shoulder: Secondary | ICD-10-CM

## 2021-04-11 DIAGNOSIS — G8929 Other chronic pain: Secondary | ICD-10-CM

## 2021-04-11 IMAGING — MR MR SHOULDER*R* W/O CM
4 of 5 series · 21 of 40 positions shown · non-contrast
Comparison: X-ray shoulder [DATE].

CLINICAL DATA: Right shoulder pain for 6 months and notes increased
pain lifting weights.

EXAM:
MRI OF THE RIGHT SHOULDER WITHOUT CONTRAST
TECHNIQUE: Multiplanar, multisequence MR imaging of the shoulder was performed.
No intravenous contrast was administered.

[Series 6: T2 fat-sat · axial · right · 3.0mm · 0.47mm/px · z∈[-85,+26]mm · 8 of 30 slices shown (1 of 3)]
[im 1/30]
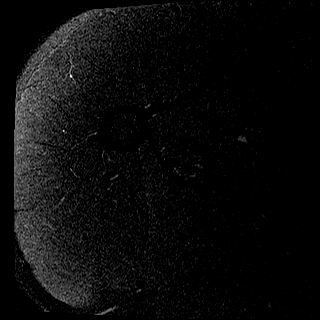
[im 4/30]
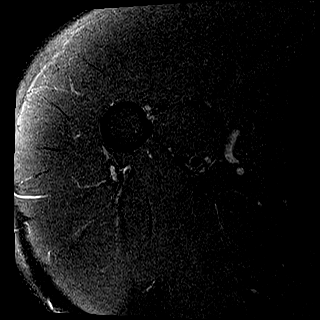
[im 10/30]
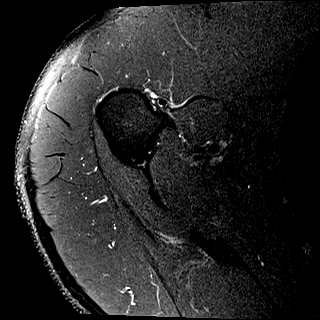
[im 13/30]
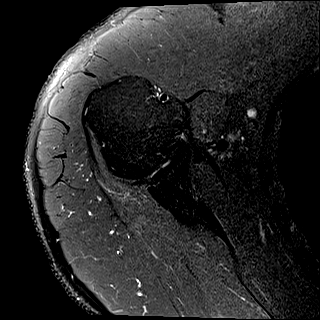
[im 17/30]
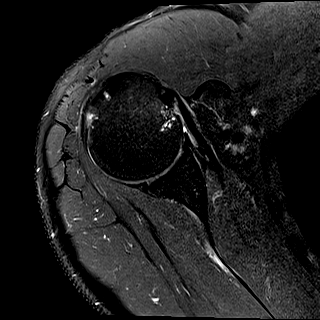
[im 20/30]
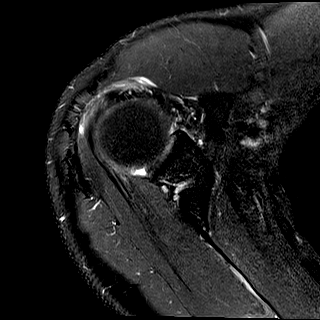
[im 26/30]
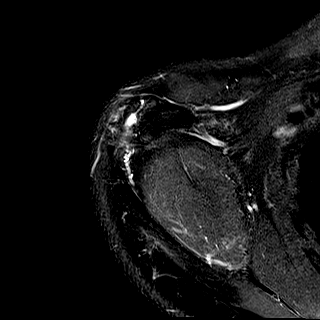
[im 30/30]
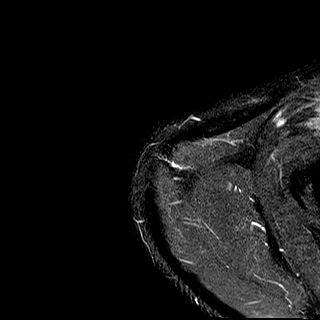

[Series 9: T2 fat-sat · oblique · right · 4.0mm · 0.44mm/px · 3 of 23 slices shown (2 of 3)]
[im 4/23]
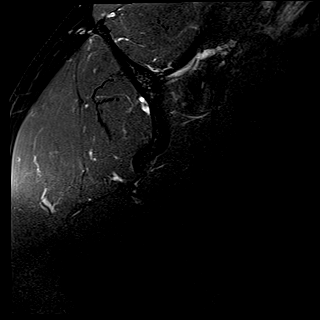
[im 13/23]
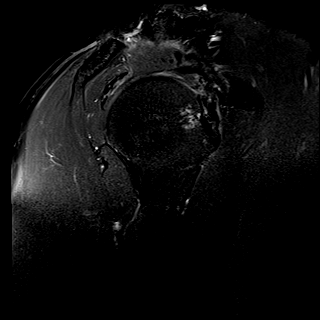
[im 19/23]
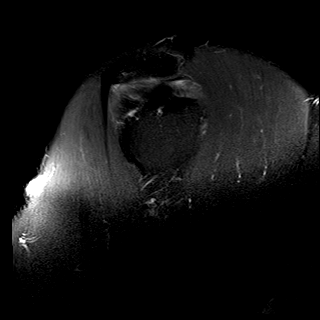

[Series 11: T2 fat-sat · oblique · right · 4.0mm · 0.22mm/px · 3 of 21 slices shown (3 of 3)]
[im 4/21]
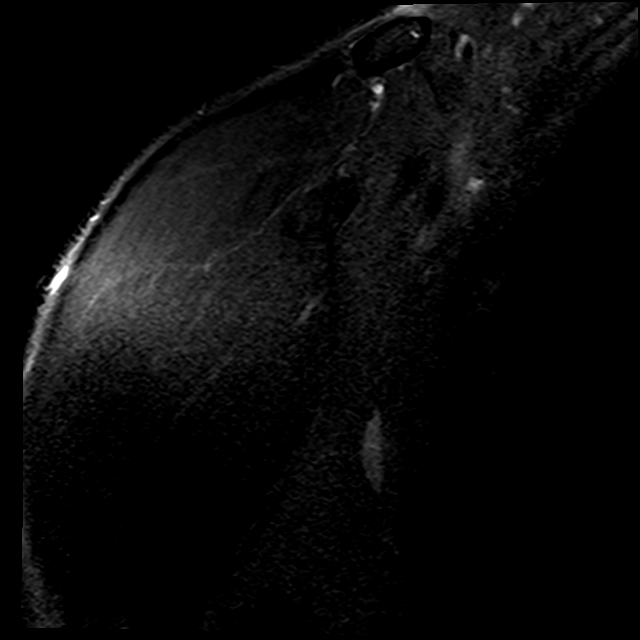
[im 11/21]
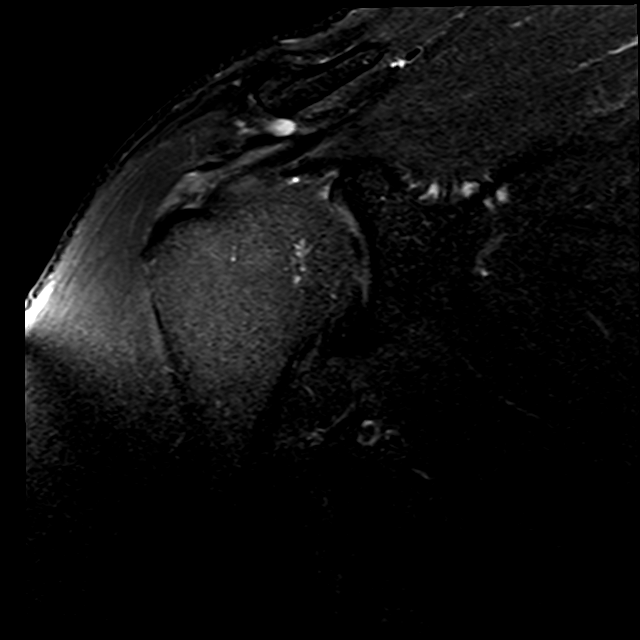
[im 17/21]
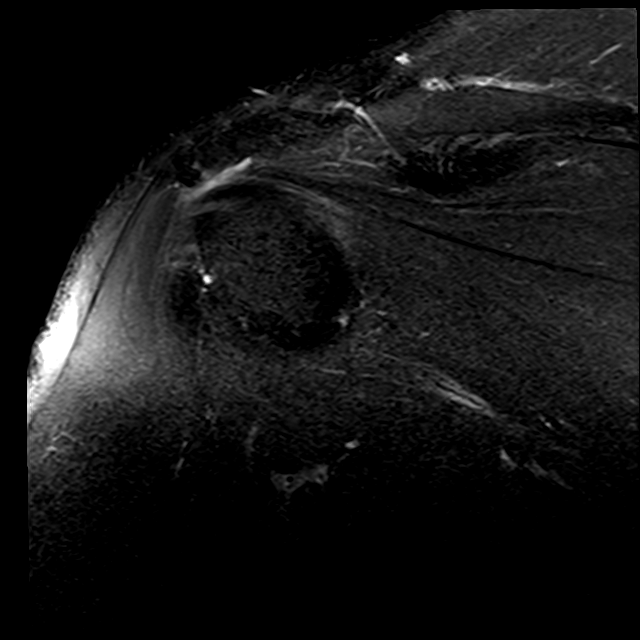

[Series 12: PD · oblique · right · 4.0mm · 0.22mm/px · 7 of 21 slices shown]
[im 1/21]
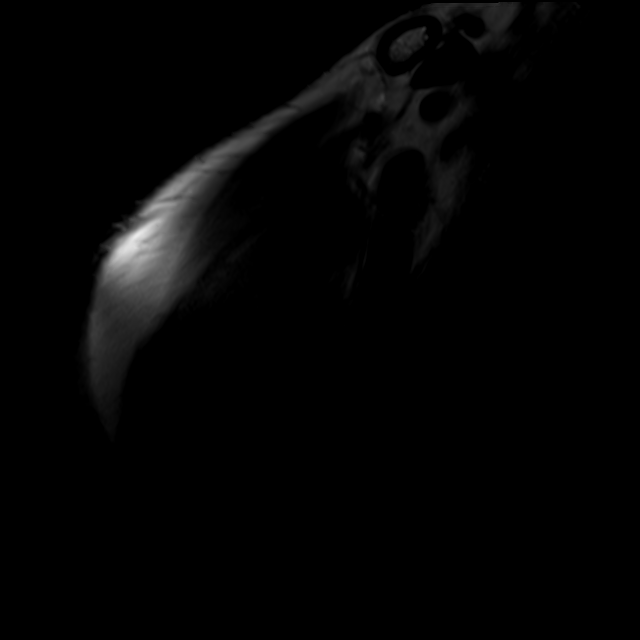
[im 4/21]
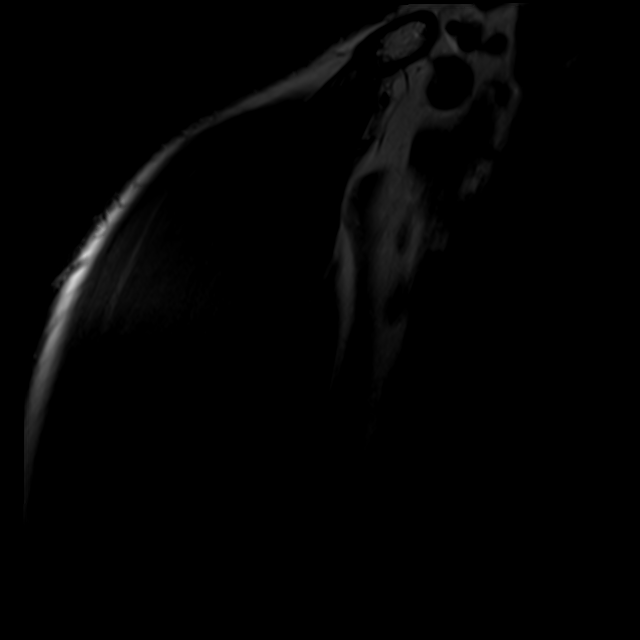
[im 7/21]
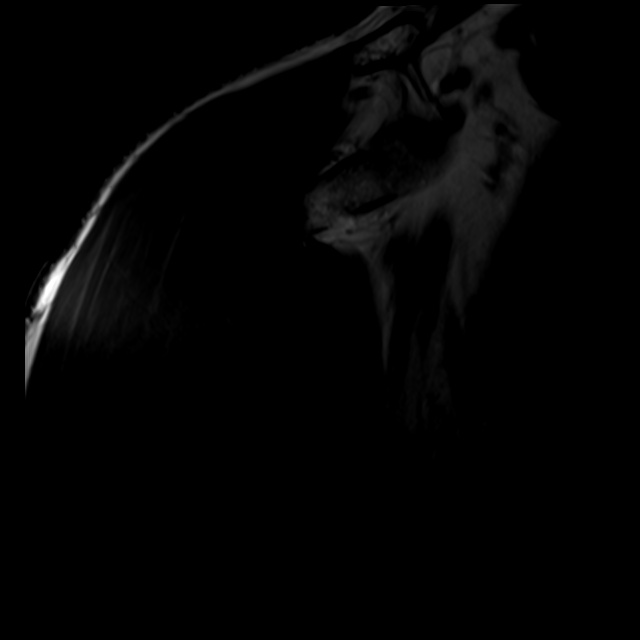
[im 11/21]
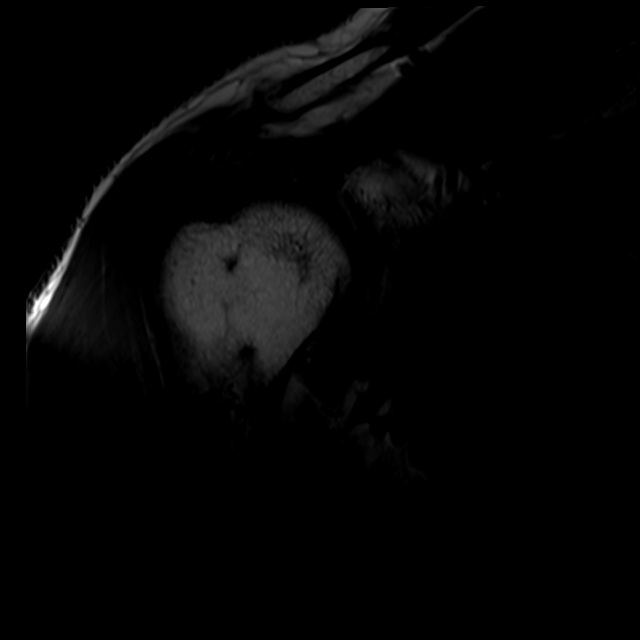
[im 14/21]
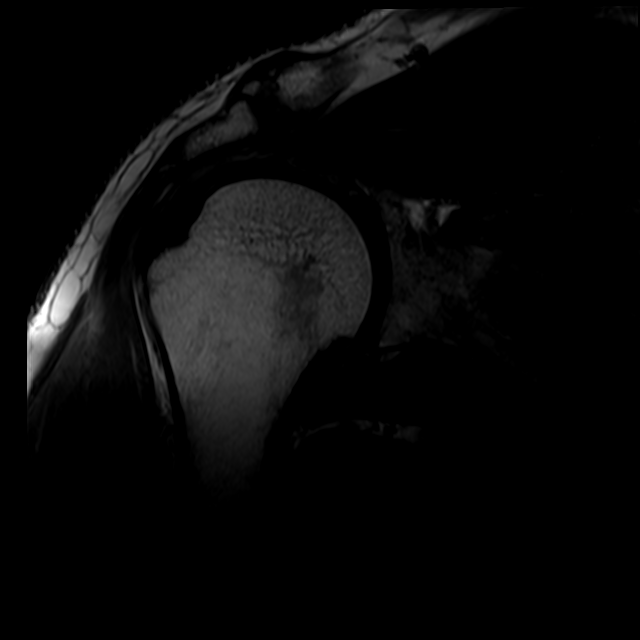
[im 17/21]
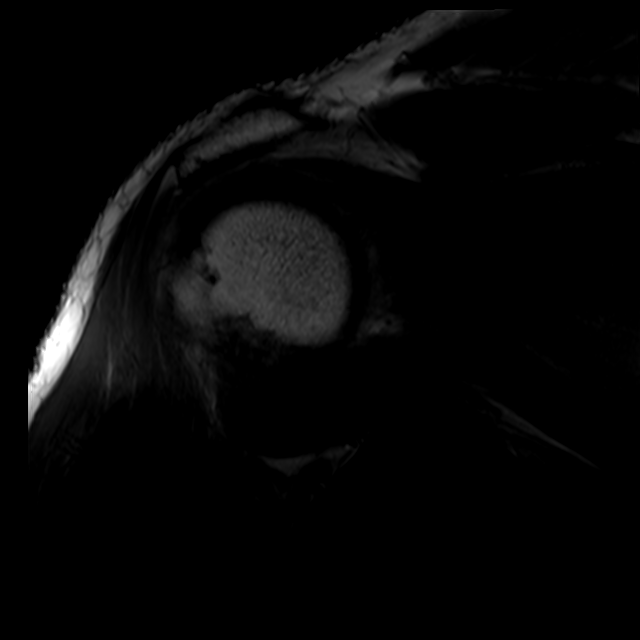
[im 21/21]
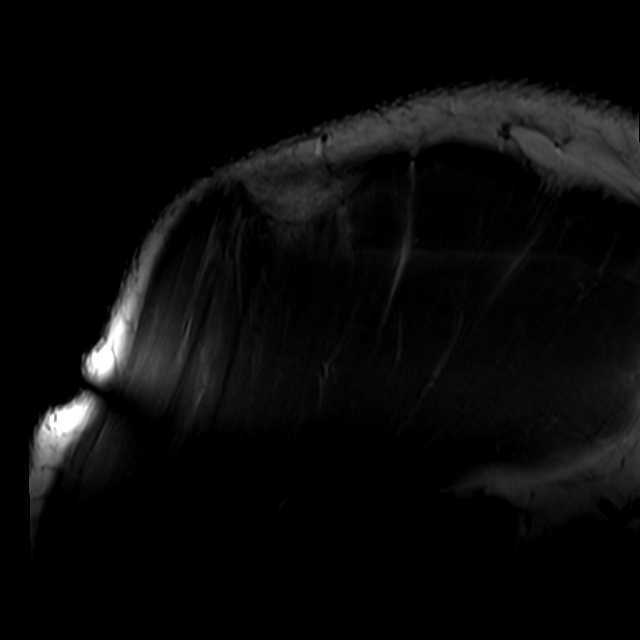

[21 of 40 positions shown; findings below may reference images not displayed]

FINDINGS: Rotator cuff: Moderate tendinosis of the supraspinatus,
infraspinatus, and subscapularis tendons. Partial-thickness tearing
of the deep insertional fibers of the distal subscapularis tendon.
No full-thickness or retracted tear. Teres minor intact.

Muscles: Preserved bulk and signal intensity of the rotator cuff
musculature without edema, atrophy, or fatty infiltration.

Biceps long head:  Grossly intact.

Acromioclavicular Joint: Mild arthropathy of the AC joint. No
significant subacromial-subdeltoid bursal fluid.

Glenohumeral Joint: No joint effusion. No chondral defect.

Labrum: Tiny cyst adjacent to the posterosuperior labrum (series 6,
image 10). The posterior labrum appears degenerated. Labral
evaluation is limited in the absence of intra-articular fluid or
contrast.

Bones:  No marrow abnormality, fracture or dislocation.

Other: None.
IMPRESSION: 1. Moderate rotator cuff tendinosis. Partial-thickness tearing of
the deep insertional fibers of the distal subscapularis tendon. No
full-thickness or retracted tear.
2. Labral degeneration with suspected posterosuperior labral tear
where there is an adjacent tiny paralabral cyst. If further
characterization is clinically desired, an MR arthrogram could be
performed.

## 2021-04-14 NOTE — Progress Notes (Signed)
Needs appt

## 2021-04-21 ENCOUNTER — Ambulatory Visit (INDEPENDENT_AMBULATORY_CARE_PROVIDER_SITE_OTHER): Payer: 59 | Admitting: Orthopaedic Surgery

## 2021-04-21 ENCOUNTER — Other Ambulatory Visit: Payer: Self-pay

## 2021-04-21 DIAGNOSIS — G8929 Other chronic pain: Secondary | ICD-10-CM | POA: Diagnosis not present

## 2021-04-21 DIAGNOSIS — M25511 Pain in right shoulder: Secondary | ICD-10-CM | POA: Diagnosis not present

## 2021-04-21 NOTE — Progress Notes (Signed)
Office Visit Note   Patient: Darin Hart           Date of Birth: 02-15-1971           MRN: [id number] Visit Date: 04/21/2021              Requested by: Georgina Quint, MD [address] [place],  [place] [address] PCP: Georgina Quint, MD   Assessment & Plan: Visit Diagnoses:  1. Chronic right shoulder pain     Plan: Gertrude returns today to discuss right shoulder MRI.  Right shoulder shows full range of motion.  Positive O'Brien's sign.  Negative Speed and negative empty can.  Right shoulder MRI shows a small paralabral cyst near the posterior superior labrum.  The posterior labrum appears slightly degenerated.  There is moderate tendinosis of the supraspinatus infraspinatus and subscapularis with partial-thickness tearing of the distal subscapularis tendon.  These findings were reviewed with the patient detail today.  He does have night pain but he actually does not have pain while lifting weights as long as he does proper warm up.  Clinically he has labral tear with SLAP component.  Subacromial injection provided temporary relief.  Based on treatment options he would like to try intra-articular injection.  We talked about option of surgery if he feels that the shoulder is affecting him to his significant degree.  Follow-Up Instructions: No follow-ups on file.   Orders:  No orders of the defined types were placed in this encounter.  No orders of the defined types were placed in this encounter.     Procedures: No procedures performed   Clinical Data: No additional findings.   Subjective: Chief Complaint  Patient presents with   Right Shoulder - Pain, Follow-up    HPI  Review of Systems  Constitutional: Negative.   All other systems reviewed and are negative.   Objective: Vital Signs: There were no vitals taken for this visit.  Physical Exam Vitals and nursing note reviewed.  Constitutional:      Appearance: He is well-developed.   Pulmonary:     Effort: Pulmonary effort is normal.  Abdominal:     Palpations: Abdomen is soft.  Skin:    General: Skin is warm.  Neurological:     Mental Status: He is alert and oriented to person, place, and time.  Psychiatric:        Behavior: Behavior normal.        Thought Content: Thought content normal.        Judgment: Judgment normal.    Ortho Exam  Specialty Comments:  No specialty comments available.  Imaging: No results found.   PMFS History: Patient Active Problem List   Diagnosis Date Noted   Low back pain 01/23/2021   Chronic musculoskeletal pain 10/02/2020   History of asthma 10/02/2020   History of multiple allergies 10/02/2020   Low testosterone 02/07/2018   Screening for prostate cancer 11/28/2017   Past Medical History:  Diagnosis Date   Arthritis    Asthma    Heart murmur     Family History  Problem Relation Age of Onset   Hypertension Mother    Other Neg Hx        hypogonadism    Past Surgical History:  Procedure Laterality Date   HERNIA REPAIR     LEG SURGERY     RHINOPLASTY     spontaneous neurothroax     Social History   Occupational History   Not on  file  Tobacco Use   Smoking status: Never   Smokeless tobacco: Never  Vaping Use   Vaping Use: Never used  Substance and Sexual Activity   Alcohol use: Yes    Comment: OCCASIONALLY   Drug use: Never   Sexual activity: Yes    Partners: Female

## 2021-05-05 ENCOUNTER — Telehealth: Payer: Self-pay | Admitting: Physical Medicine and Rehabilitation

## 2021-05-05 ENCOUNTER — Ambulatory Visit: Payer: 59 | Admitting: Physical Medicine and Rehabilitation

## 2021-05-05 NOTE — Progress Notes (Addendum)
   Darin Hart - 50 y.o. male MRN [id number]  Date of birth: 1971/02/13  Office Visit Note: Visit Date: 05/05/2021 PCP: Georgina Quint, MD Referred by: Georgina Quint, *  Subjective: No chief complaint on file.  HPI:   Patient not able to do the injection today, will rescedule   ROS Otherwise per HPI.  Assessment & Plan: Visit Diagnoses:    ICD-10-CM   1. Chronic right shoulder pain  M25.511 Large Joint Inj: R glenohumeral   G89.29 bupivacaine (MARCAINE) 0.25 % (with pres) injection 5 mL    triamcinolone acetonide (KENALOG-40) injection 40 mg      Plan: No additional findings.   Meds & Orders:  Meds ordered this encounter  Medications   bupivacaine (MARCAINE) 0.25 % (with pres) injection 5 mL   triamcinolone acetonide (KENALOG-40) injection 40 mg    Orders Placed This Encounter  Procedures   Large Joint Inj: R glenohumeral    Follow-up: reschedule injection   Procedures: No procedures performed      Clinical History: MRI OF THE RIGHT SHOULDER WITHOUT CONTRAST   TECHNIQUE: Multiplanar, multisequence MR imaging of the shoulder was performed. No intravenous contrast was administered.   COMPARISON:  X-ray shoulder 07/29/2020.   FINDINGS: Rotator cuff: Moderate tendinosis of the supraspinatus, infraspinatus, and subscapularis tendons. Partial-thickness tearing of the deep insertional fibers of the distal subscapularis tendon. No full-thickness or retracted tear. Teres minor intact.   Muscles: Preserved bulk and signal intensity of the rotator cuff musculature without edema, atrophy, or fatty infiltration.   Biceps long head:  Grossly intact.   Acromioclavicular Joint: Mild arthropathy of the AC joint. No significant subacromial-subdeltoid bursal fluid.   Glenohumeral Joint: No joint effusion. No chondral defect.   Labrum: Tiny cyst adjacent to the posterosuperior labrum (series 6, image 10). The posterior labrum appears degenerated.  Labral evaluation is limited in the absence of intra-articular fluid or contrast.   Bones:  No marrow abnormality, fracture or dislocation.   Other: None.   IMPRESSION: 1. Moderate rotator cuff tendinosis. Partial-thickness tearing of the deep insertional fibers of the distal subscapularis tendon. No full-thickness or retracted tear. 2. 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 If further characterization is clinically desired, an MR arthrogram could be performed.     Electronically Signed   By: Duanne Guess D.O.   On: 04/14/2021 09:44     Objective:  VS:  HT:    WT:   BMI:     BP:   HR: bpm  TEMP: ( )  RESP:  Physical Exam   Imaging: No results found.

## 2021-05-05 NOTE — Telephone Encounter (Signed)
Pt called stating he has an appt at 8:45 but he's having car troubles. Pt would like to know if he can come later today? He would like a CB.  [phone]

## 2021-05-12 MED ORDER — TRIAMCINOLONE ACETONIDE 40 MG/ML IJ SUSP: 40.0000 mg | INTRAMUSCULAR | Status: DC | PRN

## 2021-05-12 MED ORDER — BUPIVACAINE HCL 0.25 % IJ SOLN: 5.0000 mL | INTRAMUSCULAR | Status: DC | PRN

## 2021-06-01 ENCOUNTER — Ambulatory Visit: Payer: 59 | Admitting: Physical Medicine and Rehabilitation

## 2021-06-01 ENCOUNTER — Telehealth: Payer: Self-pay | Admitting: Physical Medicine and Rehabilitation

## 2021-06-01 NOTE — Telephone Encounter (Signed)
Pt called requesting to come later today if possible. Please call pt about this matter at [phone].

## 2021-06-11 ENCOUNTER — Ambulatory Visit (INDEPENDENT_AMBULATORY_CARE_PROVIDER_SITE_OTHER): Payer: 59 | Admitting: Physical Medicine and Rehabilitation

## 2021-06-11 ENCOUNTER — Encounter: Payer: Self-pay | Admitting: Physical Medicine and Rehabilitation

## 2021-06-11 ENCOUNTER — Ambulatory Visit: Payer: Self-pay

## 2021-06-11 ENCOUNTER — Other Ambulatory Visit: Payer: Self-pay

## 2021-06-11 DIAGNOSIS — M25511 Pain in right shoulder: Secondary | ICD-10-CM

## 2021-06-11 DIAGNOSIS — G8929 Other chronic pain: Secondary | ICD-10-CM

## 2021-06-11 MED ORDER — TRIAMCINOLONE ACETONIDE 40 MG/ML IJ SUSP
40.0000 mg | INTRAMUSCULAR | Status: AC | PRN
  Administered 2021-06-11: 40 mg via INTRA_ARTICULAR

## 2021-06-11 MED ORDER — BUPIVACAINE HCL 0.25 % IJ SOLN
5.0000 mL | INTRAMUSCULAR | Status: AC | PRN
  Administered 2021-06-11: 5 mL via INTRA_ARTICULAR

## 2021-06-11 NOTE — Progress Notes (Signed)
Pt state right shoulder pain. Pt state laying down and movement with his right arm makes the pain worse. Pt state he takes over the counter pain meds to help ease his pain.  Numeric Pain Rating Scale and Functional Assessment Average Pain 6   In the last MONTH (on 0-10 scale) has pain interfered with the following?  1. General activity like being  able to carry out your everyday physical activities such as walking, climbing stairs, carrying groceries, or moving a chair?  Rating(10)   -BT, -Dye Allergies.

## 2021-06-11 NOTE — Progress Notes (Addendum)
Darin Hart - 51 y.o. male MRN [id number]  Date of birth: 06/03/1970  Office Visit Note: Visit Date: 06/11/2021 PCP: Georgina Quint, MD Referred by: Georgina Quint, *  Subjective: Chief Complaint  Patient presents with   Right Shoulder - Pain   HPI:  Darin Hart is a 51 y.o. male who comes in today at the request of Dr. Glee Arvin for planned Right anesthetic glenohumeral arthrogram with fluoroscopic guidance.  The patient has failed conservative care including home exercise, medications, time and activity modification.  This injection will be diagnostic and hopefully therapeutic.  Please see requesting physician notes for further details and justification.   ROS Otherwise per HPI.  Assessment & Plan: Visit Diagnoses:    ICD-10-CM   1. Chronic right shoulder pain  M25.511 XR C-ARM NO REPORT   G89.29       Plan: No additional findings.   Meds & Orders: No orders of the defined types were placed in this encounter.   Orders Placed This Encounter  Procedures   XR C-ARM NO REPORT    Follow-up: Return for visit to requesting provider as needed.   Procedures: Large Joint Inj: R glenohumeral on 06/11/2021 1:25 PM Indications: pain and diagnostic evaluation Details: 22 G 3.5 in needle, fluoroscopy-guided anteromedial approach  Arthrogram: No  Medications: 40 mg triamcinolone acetonide 40 MG/ML; 5 mL bupivacaine 0.25 % Outcome: tolerated well, no immediate complications  There was excellent flow of contrast producing a partial arthrogram of the glenohumeral joint. The patient did have relief of symptoms during the anesthetic phase of the injection. Procedure, treatment alternatives, risks and benefits explained, specific risks discussed. Consent was given by the patient. Immediately prior to procedure a time out was called to verify the correct patient, procedure, equipment, support staff and site/side marked as required. Patient was prepped and draped  in the usual sterile fashion.         Clinical History: MRI OF THE RIGHT SHOULDER WITHOUT CONTRAST   TECHNIQUE: Multiplanar, multisequence MR imaging of the shoulder was performed. No intravenous contrast was administered.   COMPARISON:  X-ray shoulder 07/29/2020.   FINDINGS: Rotator cuff: Moderate tendinosis of the supraspinatus, infraspinatus, and subscapularis tendons. Partial-thickness tearing of the deep insertional fibers of the distal subscapularis tendon. No full-thickness or retracted tear. Teres minor intact.   Muscles: Preserved bulk and signal intensity of the rotator cuff musculature without edema, atrophy, or fatty infiltration.   Biceps long head:  Grossly intact.   Acromioclavicular Joint: Mild arthropathy of the AC joint. No significant subacromial-subdeltoid bursal fluid.   Glenohumeral Joint: No joint effusion. No chondral defect.   Labrum: Tiny cyst adjacent to the posterosuperior labrum (series 6, image 10). The posterior labrum appears degenerated. Labral evaluation is limited in the absence of intra-articular fluid or contrast.   Bones:  No marrow abnormality, fracture or dislocation.   Other: None.   IMPRESSION: 1. Moderate rotator cuff tendinosis. Partial-thickness tearing of the deep insertional fibers of the distal subscapularis tendon. No full-thickness or retracted tear. 2. 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 If further characterization is clinically desired, an MR arthrogram could be performed.     Electronically Signed   By: Duanne Guess D.O.   On: 04/14/2021 09:44     Objective:  VS:  HT:     WT:    BMI:      BP:    HR: bpm   TEMP: ( )  RESP:  Physical Exam   Imaging: No results found.

## 2021-06-28 ENCOUNTER — Other Ambulatory Visit: Payer: Self-pay

## 2021-06-28 ENCOUNTER — Emergency Department (HOSPITAL_COMMUNITY): Payer: 59

## 2021-06-28 ENCOUNTER — Emergency Department (HOSPITAL_COMMUNITY)
Admission: EM | Admit: 2021-06-28 | Discharge: 2021-06-28 | Disposition: A | Discharge status: Home / Self Care | Payer: 59 | Attending: Emergency Medicine | Admitting: Emergency Medicine

## 2021-06-28 ENCOUNTER — Encounter (HOSPITAL_COMMUNITY): Payer: Self-pay

## 2021-06-28 DIAGNOSIS — M25511 Pain in right shoulder: Secondary | ICD-10-CM | POA: Insufficient documentation

## 2021-06-28 MED ORDER — NAPROXEN 500 MG PO TABS
500.0000 mg | ORAL_TABLET | Freq: Once | ORAL | Status: AC
  Administered 2021-06-28: 500 mg via ORAL
  Filled 2021-06-28: qty 1

## 2021-06-28 MED ORDER — NAPROXEN 375 MG PO TABS: 375.0000 mg | ORAL_TABLET | Freq: Two times a day (BID) | ORAL | 0 refills | Status: DC

## 2021-06-28 NOTE — Progress Notes (Signed)
Orthopedic Tech Progress Note Patient Details:  Darin Hart 05/07/1971 [id number]  Ortho Devices Type of Ortho Device: Sling immobilizer Ortho Device/Splint Interventions: Application   Post Interventions Patient Tolerated: Well Instructions Provided: Care of device  [name] 06/28/2021, 1:19 PM

## 2021-06-28 NOTE — ED Notes (Signed)
Declined shoulder immobilizer

## 2021-06-28 NOTE — ED Triage Notes (Signed)
Patient reports that he was working out yesterday and heard a tear in his right bicep area and right shoulder.

## 2021-06-28 NOTE — Discharge Instructions (Signed)
Please call to schedule an appointment with orthopedics.  I provided you a shoulder sling for comfort.  I would limit lifting with the shoulder until follow-up with orthopedics.  Please use naproxen for pain control.  Please return to the emergency department for worsening symptoms.

## 2021-06-28 NOTE — ED Provider Notes (Signed)
[facility]-EMERGENCY DEPT Provider Note   CSN: [id number] Arrival date & time: 06/28/21  1120     History Chief Complaint  Patient presents with   Shoulder Pain    Darin Hart is a 51 y.o. male who presents the emergency department with right shoulder pain that started yesterday while working out.  Patient states he was seen for setting doing chest press and power cleans and upon doing a park when he felt a tearing sensation in the shoulder followed by pain and weakness.  Since then he is been having pain in the shoulder.  No other significant injury or trauma to the shoulder.  No numbness.   Shoulder Pain     Home Medications Prior to Admission medications   Medication Sig Start Date End Date Taking? Authorizing Provider  naproxen (NAPROSYN) 375 MG tablet Take 1 tablet (375 mg total) by mouth 2 (two) times daily. 06/28/21  Yes Meredeth Ide, Clarabel Marion M, PA-C  albuterol (VENTOLIN HFA) 108 (90 Base) MCG/ACT inhaler Inhale 2 puffs into the lungs every 6 (six) hours as needed for wheezing or shortness of breath. 10/02/20   Georgina Quint, MD  methocarbamol (ROBAXIN) 500 MG tablet Take 1 tablet (500 mg total) by mouth every 8 (eight) hours as needed for muscle spasms. 11/20/20   Naida Sleight, PA-C  Multiple Vitamin (MULTIVITAMIN WITH MINERALS) TABS tablet Take 1 tablet by mouth daily.    [provider]  Omega-3 Fatty Acids (FISH OIL BURP-LESS PO) Take 2 capsules by mouth daily.    [provider]      Allergies    Shellfish allergy, Other, and Penicillins    Review of Systems   Review of Systems  All other systems reviewed and are negative.  Physical Exam Updated Vital Signs BP 138/85    Pulse 75    Temp (!) 97.3 F (36.3 C) (Oral)    Resp 16    Ht 6' (1.829 m)    Wt 88 kg    SpO2 98%    BMI 26.31 kg/m  Physical Exam Vitals and nursing note reviewed.  Constitutional:      Appearance: Normal appearance.  HENT:     Head:  Normocephalic and atraumatic.  Eyes:     General:        Right eye: No discharge.        Left eye: No discharge.     Conjunctiva/sclera: Conjunctivae normal.  Pulmonary:     Effort: Pulmonary effort is normal.  Musculoskeletal:     Comments: Positive Neer test.  Positive Hawkins.  Patient restricted on empty can test.  Negative speeds test.  Tenderness to palpation over the infraspinatus and supraspinatus muscles.  There is no specific bicep tendon tenderness.  Skin:    General: Skin is warm and dry.     Findings: No rash.  Neurological:     General: No focal deficit present.     Mental Status: He is alert.  Psychiatric:        Mood and Affect: Mood normal.        Behavior: Behavior normal.    ED Results / Procedures / Treatments   Labs (all labs ordered are listed, but only abnormal results are displayed) Labs Reviewed - No data to display  EKG None  Radiology DG Shoulder Right  Result Date: 06/28/2021 CLINICAL DATA:  Right shoulder pain EXAM: RIGHT SHOULDER - 2+ VIEW COMPARISON:  None. FINDINGS: No fracture or dislocation. Mild degenerative changes  of the acromioclavicular joint. Soft tissues are normal. IMPRESSION: 1. No acute osseous injury of the right shoulder. Electronically Signed   By: Elige Ko M.D.   On: 06/28/2021 13:05    Procedures Procedures    Medications Ordered in ED Medications  naproxen (NAPROSYN) tablet 500 mg (500 mg Oral Given 06/28/21 1206)    ED Course/ Medical Decision Making/ A&P                           Medical Decision Making Amount and/or Complexity of Data Reviewed Radiology: ordered.  Risk Prescription drug management.   Darin Hart is a 51 y.o. male with no significant comorbidities to impact his care today who presents to the emergency department with right-sided shoulder pain.  I am suspicious for possible impingement syndrome versus rotator cuff tear.  I have a lower suspicion for bicipital tendinitis at this time.  We  will plan to get imaging to reevaluate for bony abnormalities and give him a dose of naproxen here.  He will likely need orthopedic follow-up for further evaluation.  I personally reviewed the imaging which did not reveal any fractures or dislocations. This is likely impingement syndrome.  I will give him a prescription for naproxen shoulder sling for comfort and have him follow-up with orthopedics for further evaluation.  Strict turn precautions given.  He is safe for discharge.  Final Clinical Impression(s) / ED Diagnoses Final diagnoses:  Acute pain of right shoulder    Rx / DC Orders ED Discharge Orders          Ordered    naproxen (NAPROSYN) 375 MG tablet  2 times daily        06/28/21 [address] [place], [place] 06/28/21 1320    Rolan Bucco, MD 06/28/21 [phone]

## 2021-07-07 ENCOUNTER — Other Ambulatory Visit: Payer: Self-pay

## 2021-07-07 ENCOUNTER — Encounter: Payer: Self-pay | Admitting: Orthopaedic Surgery

## 2021-07-07 ENCOUNTER — Ambulatory Visit (INDEPENDENT_AMBULATORY_CARE_PROVIDER_SITE_OTHER): Payer: 59 | Admitting: Orthopaedic Surgery

## 2021-07-07 DIAGNOSIS — M25511 Pain in right shoulder: Secondary | ICD-10-CM

## 2021-07-07 DIAGNOSIS — G8929 Other chronic pain: Secondary | ICD-10-CM | POA: Diagnosis not present

## 2021-07-07 NOTE — Progress Notes (Signed)
Office Visit Note   Patient: Darin Hart           Date of Birth: [birth year]           MRN: [id number] Visit Date: 07/07/2021              Requested by: Horald Pollen, [place],  [place] [address] PCP: Horald Pollen, MD   Assessment & Plan: Visit Diagnoses:  1. Chronic right shoulder pain     Plan: Eloise returns today for acute right shoulder pain.  He went to the ED on 06/28/2021.  He was at the gym working out and heard and felt a tear in his right shoulder.  He is also noticed deformity to his right biceps.  He has been unable to lift anything more than a couple pounds.  He has pain that is 10 out of 10.  Examination of the right shoulder girdle shows slight asymmetry of the contour of the biceps consistent with a Popeye deformity.  He has tenderness there.  Pain with Speed test.  Positive O'Brien's sign.  Cuff testing is grossly normal.  Range of motion is normal.  Based on findings impression is traumatic right biceps tear.  He had a previous MRI which showed posterior superior labral cyst but this was done without contrast.  In light of the new injury and Popeye deformity we will need to get a new MR arthrogram to fully evaluate for structural abnormalities.  Follow-up after the MRI.  Follow-Up Instructions: No follow-ups on file.   Orders:  No orders of the defined types were placed in this encounter.  No orders of the defined types were placed in this encounter.     Procedures: No procedures performed   Clinical Data: No additional findings.   Subjective: Chief Complaint  Patient presents with   Right Shoulder - Pain    HPI  Review of Systems  Constitutional: Negative.   All other systems reviewed and are negative.   Objective: Vital Signs: There were no vitals taken for this visit.  Physical Exam Vitals and nursing note reviewed.  Constitutional:      Appearance: He is well-developed.  Pulmonary:      Effort: Pulmonary effort is normal.  Abdominal:     Palpations: Abdomen is soft.  Skin:    General: Skin is warm.  Neurological:     Mental Status: He is alert and oriented to person, place, and time.  Psychiatric:        Behavior: Behavior normal.        Thought Content: Thought content normal.        Judgment: Judgment normal.    Ortho Exam  Specialty Comments:  No specialty comments available.  Imaging: No results found.   PMFS History: Patient Active Problem List   Diagnosis Date Noted   Low back pain 01/23/2021   Chronic musculoskeletal pain 10/02/2020   History of asthma 10/02/2020   History of multiple allergies 10/02/2020   Low testosterone 02/07/2018   Screening for prostate cancer 11/28/2017   Past Medical History:  Diagnosis Date   Arthritis    Asthma    Heart murmur     Family History  Problem Relation Age of Onset   Hypertension Mother    Other Neg Hx        hypogonadism    Past Surgical History:  Procedure Laterality Date   HERNIA REPAIR     LEG SURGERY  RHINOPLASTY     spontaneous neurothroax     Social History   Occupational History   Not on file  Tobacco Use   Smoking status: Never   Smokeless tobacco: Never  Vaping Use   Vaping Use: Never used  Substance and Sexual Activity   Alcohol use: Yes    Comment: OCCASIONALLY   Drug use: Never   Sexual activity: Yes    Partners: Female

## 2021-07-08 ENCOUNTER — Other Ambulatory Visit: Payer: Self-pay

## 2021-07-08 DIAGNOSIS — G8929 Other chronic pain: Secondary | ICD-10-CM

## 2021-07-08 DIAGNOSIS — M25511 Pain in right shoulder: Secondary | ICD-10-CM

## 2021-07-13 ENCOUNTER — Telehealth: Payer: Self-pay | Admitting: Orthopaedic Surgery

## 2021-07-13 DIAGNOSIS — G8929 Other chronic pain: Secondary | ICD-10-CM

## 2021-07-13 NOTE — Telephone Encounter (Signed)
Pt called and states that he is allergic to the medicine for the contrast mri. Give pt a call.

## 2021-07-13 NOTE — Telephone Encounter (Signed)
Please advise. Would you like MRI without?

## 2021-07-13 NOTE — Telephone Encounter (Signed)
yes

## 2021-07-13 NOTE — Telephone Encounter (Signed)
Authorization has already been obtained for MRI arthrogram right shoulder, however, patient is allergic to contrast. Per Dr. Roda Shutters, order MRI right shoulder without contrast. New MRI order entered. Could you please cancel MRI arthrogram order and obtain authorization for new order? Thank you.

## 2021-07-13 NOTE — Addendum Note (Signed)
Addended by: Meyer Cory on: 07/13/2021 12:00 PM   Modules accepted: Orders

## 2021-07-14 NOTE — Telephone Encounter (Signed)
The order and authorization have both been updated. I sent a MyChart message to the patient, informing him of this.

## 2021-07-22 ENCOUNTER — Other Ambulatory Visit: Payer: Self-pay

## 2021-07-22 ENCOUNTER — Ambulatory Visit
Admission: RE | Admit: 2021-07-22 | Discharge: 2021-07-22 | Disposition: A | Discharge status: Home / Self Care | Payer: 59 | Source: Ambulatory Visit | Attending: Orthopaedic Surgery | Admitting: Orthopaedic Surgery

## 2021-07-22 DIAGNOSIS — M25511 Pain in right shoulder: Secondary | ICD-10-CM

## 2021-07-22 DIAGNOSIS — G8929 Other chronic pain: Secondary | ICD-10-CM

## 2021-07-22 IMAGING — MR MR SHOULDER*R* W/O CM
4 of 5 series · 25 of 40 positions shown · non-contrast
Comparison: Right shoulder radiographs [DATE], MRI right
shoulder [DATE]

CLINICAL DATA: Right shoulder and biceps pain. Weakness. Difficulty
flexing arm. History of right shoulder rotator cuff surgery 10 years
ago.

EXAM:
MRI OF THE RIGHT SHOULDER WITHOUT CONTRAST
TECHNIQUE: Multiplanar, multisequence MR imaging of the shoulder was performed.
No intravenous contrast was administered.

[Series 6: T2 fat-sat · axial · right · 3.0mm · 0.50mm/px · z∈[-59,+59]mm · 8 of 32 slices shown (1 of 3)]
[im 1/32]
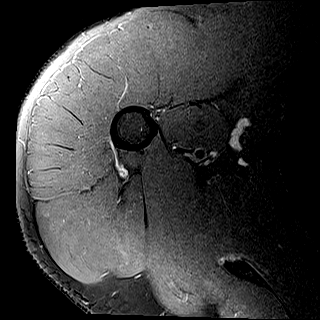
[im 4/32]
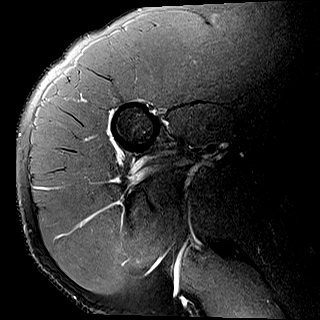
[im 11/32]
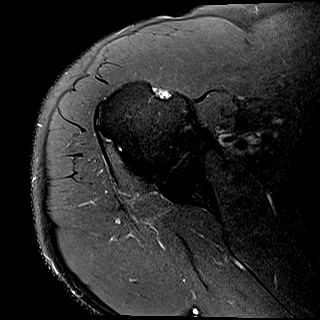
[im 14/32]
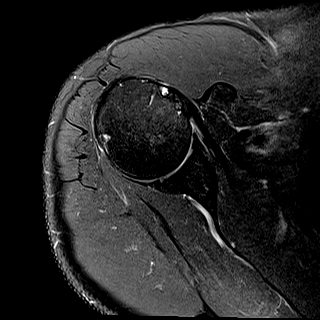
[im 18/32]
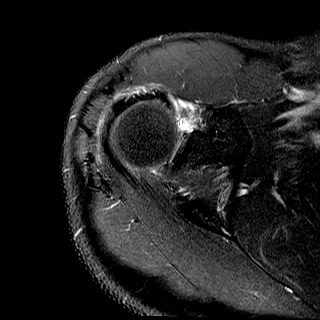
[im 21/32]
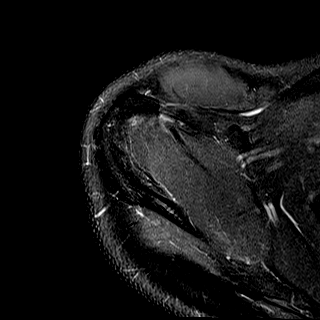
[im 28/32]
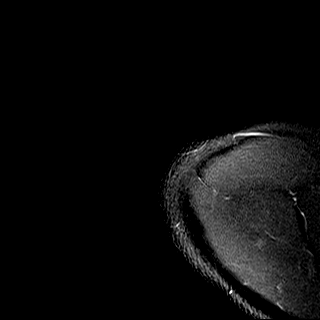
[im 32/32]
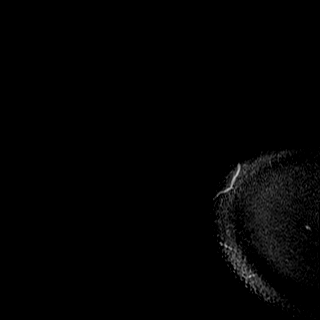

[Series 7: T2 fat-sat · oblique · right · 4.0mm · 0.31mm/px · 7 of 24 slices shown (2 of 3)]
[im 1/24]
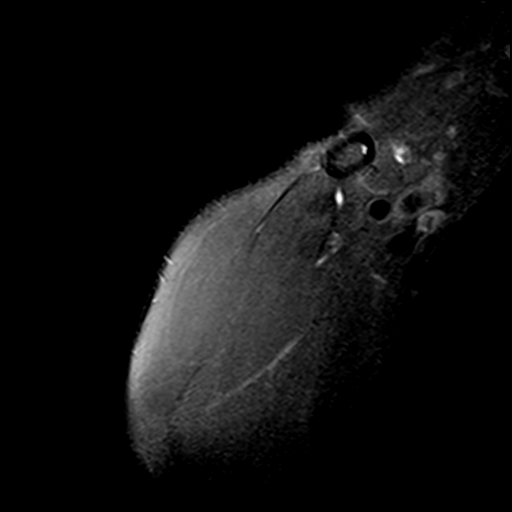
[im 4/24]
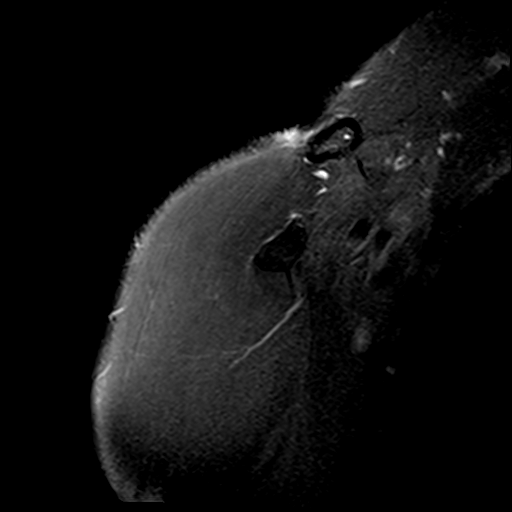
[im 8/24]
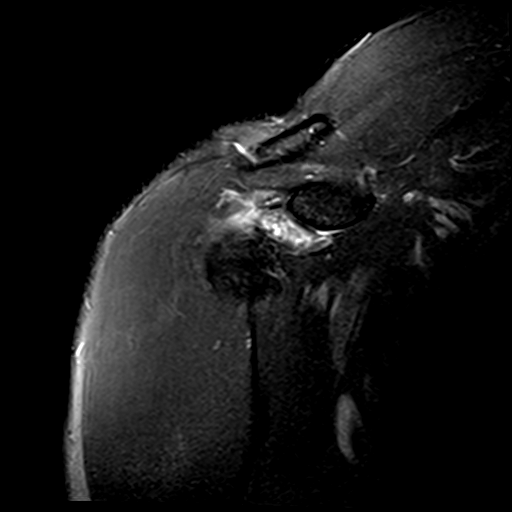
[im 12/24]
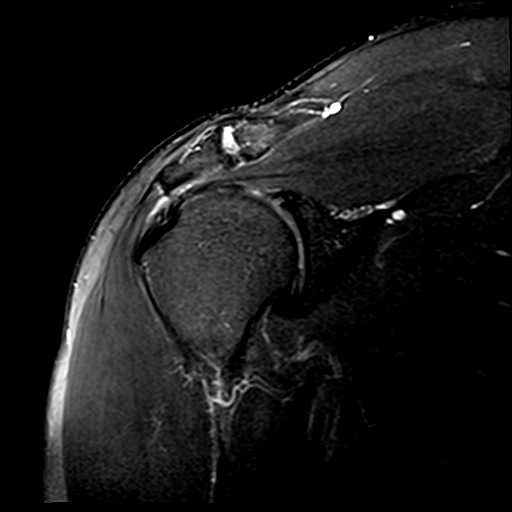
[im 16/24]
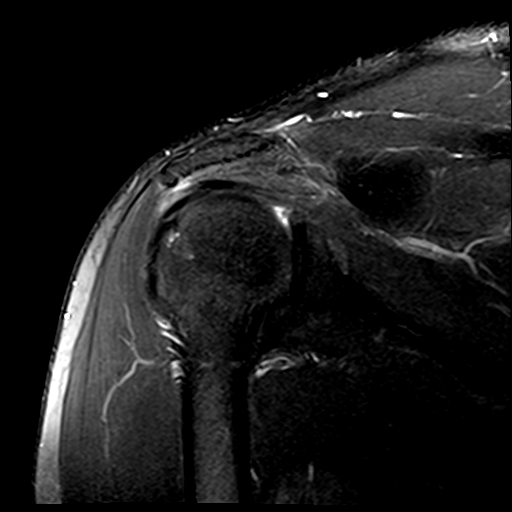
[im 20/24]
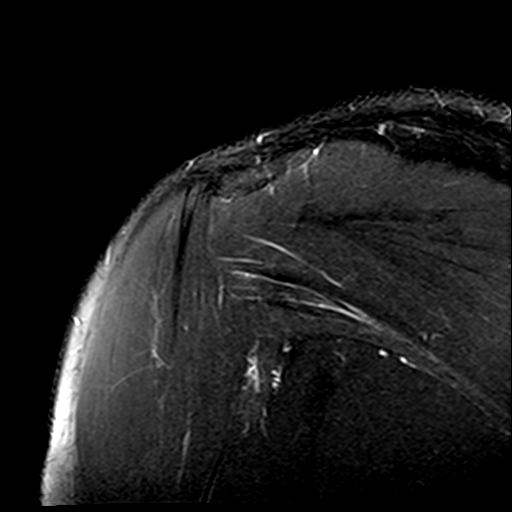
[im 24/24]
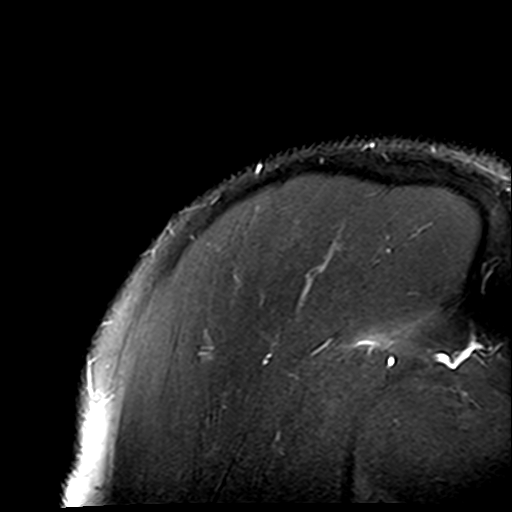

[Series 8: PD · oblique · right · 4.0mm · 0.31mm/px · 7 of 24 slices shown]
[im 1/24]
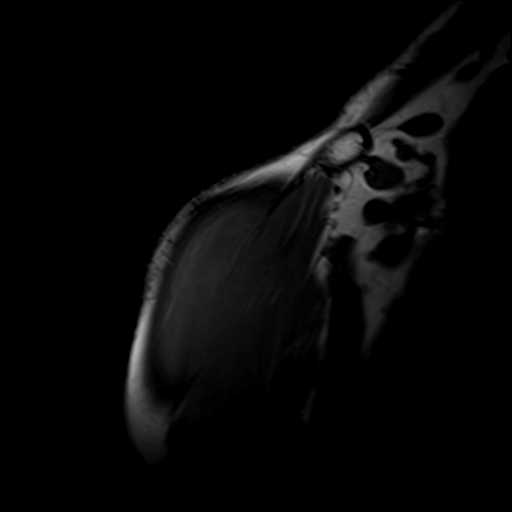
[im 4/24]
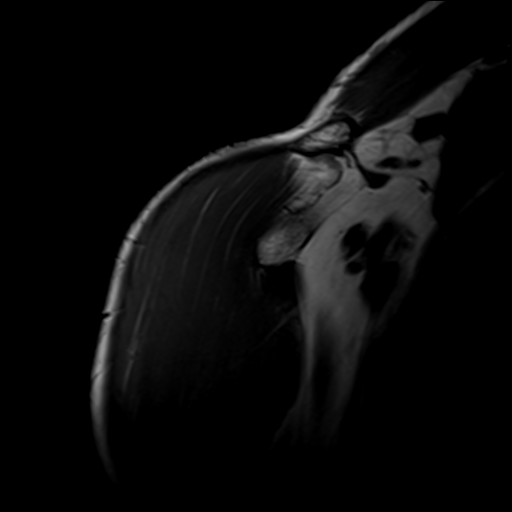
[im 8/24]
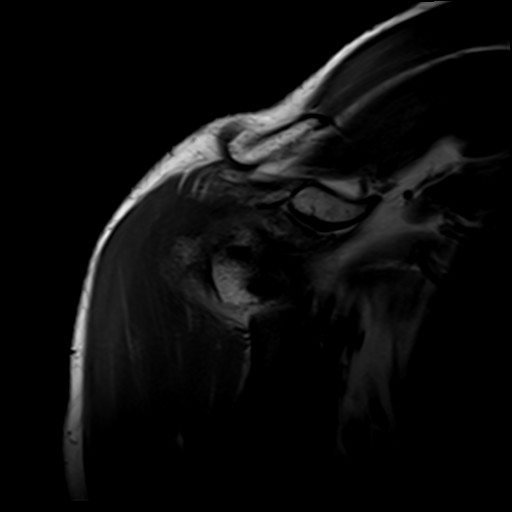
[im 12/24]
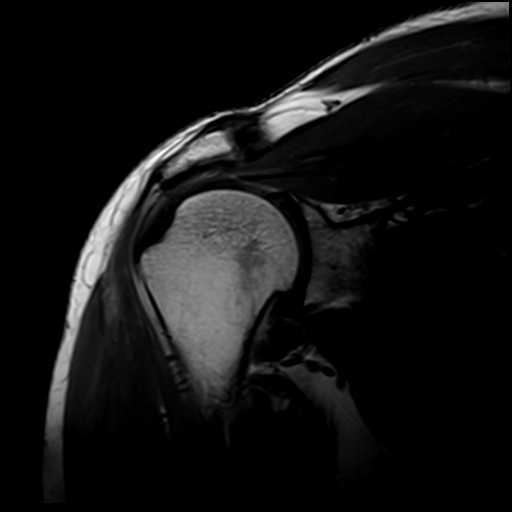
[im 16/24]
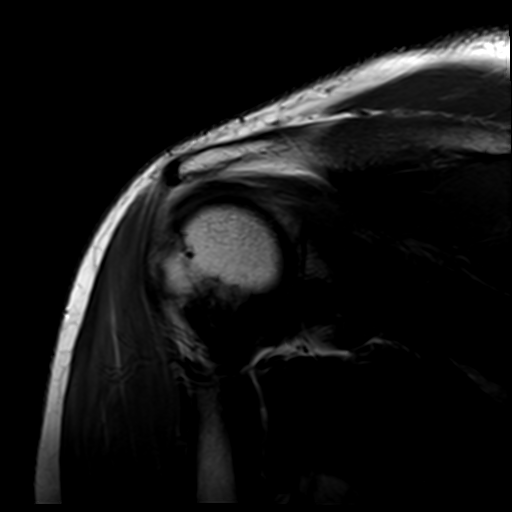
[im 20/24]
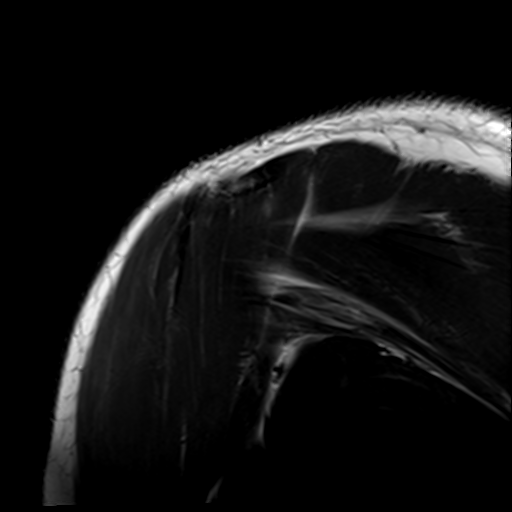
[im 24/24]
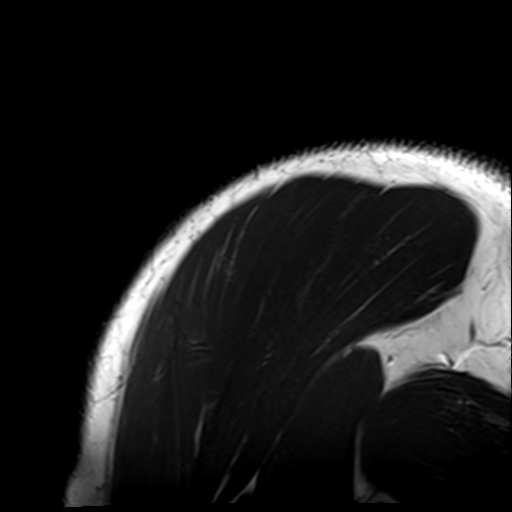

[Series 9: T2 fat-sat · oblique · right · 4.0mm · 0.62mm/px · 3 of 26 slices shown (3 of 3)]
[im 4/26]
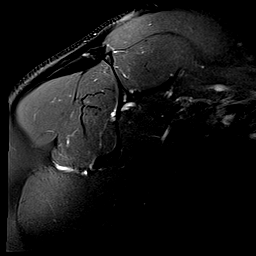
[im 15/26]
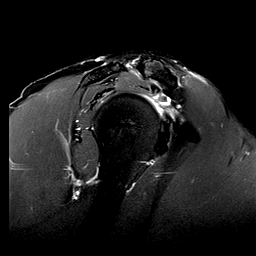
[im 22/26]
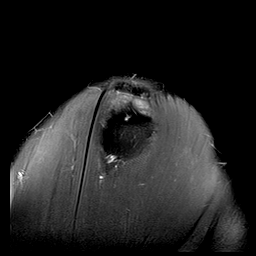

[25 of 40 positions shown; findings below may reference images not displayed]

FINDINGS: Rotator cuff: Mild intermediate proton density signal tendinosis of
the bursal aspect of the far anterior supraspinatus tendon. There is
again linear intermediate T2 signal within the posterior
supraspinatus tendon insertion, a tiny partial-thickness
midsubstance tear (sagittal series 9, image 6) without tendon
retraction. Mild partial-thickness tearing of the articular sided
fibers of the subscapularis tendon insertion unchanged. The teres
minor is intact.

Muscles: No rotator cuff muscle atrophy, fatty infiltration, or
edema.

Biceps long head: The long head of the biceps tendon is newly no
longer visualized proximal to or within the bicipital groove
suggesting a new full-thickness proximal tear with distal tendon
retraction.

Acromioclavicular Joint: There are mild degenerative changes of the
acromioclavicular joint including joint space narrowing, subchondral
marrow edema, and peripheral osteophytosis. Type 3 acromion with
downsloping of the anterolateral acromion. No subacromial/subdeltoid
bursitis.

Glenohumeral Joint: Mild moderate thinning of the glenoid and
humeral head cartilage. There is again linear intermediate proton
density signal degenerative irregularity of the posterior glenoid
labrum. The prior tiny paralabral cyst at the posterosuperior aspect
of the glenoid labrum is no longer visualized.

Labrum: Grossly intact, but evaluation is limited by lack of
intraarticular fluid.

Bones:  No acute fracture.

Other: None.
IMPRESSION: Compared to [DATE]:

1. Mild anterior supraspinatus bursal sided tendinosis.
2. Unchanged tiny midsubstance partial-thickness tear of the
infraspinatus tendon insertion.
3. Unchanged partial-thickness tearing of the articular side of the
subscapularis tendon insertion. New full-thickness tear of the
proximal biceps tendon origin with distal tendon retraction. The
tendon is not visualized.
4. Mild degenerative changes of the acromioclavicular joint. Type 3
acromion.

## 2021-07-23 NOTE — Progress Notes (Signed)
Needs appt

## 2021-07-29 ENCOUNTER — Encounter: Payer: Self-pay | Admitting: Emergency Medicine

## 2021-07-29 ENCOUNTER — Other Ambulatory Visit: Payer: Self-pay

## 2021-07-29 ENCOUNTER — Ambulatory Visit: Payer: 59 | Admitting: Emergency Medicine

## 2021-07-29 VITALS — BP 122/60 | HR 75 | Ht 72.0 in | Wt 188.0 lb

## 2021-07-29 DIAGNOSIS — Z13228 Encounter for screening for other metabolic disorders: Secondary | ICD-10-CM | POA: Diagnosis not present

## 2021-07-29 DIAGNOSIS — Z13220 Encounter for screening for lipoid disorders: Secondary | ICD-10-CM | POA: Diagnosis not present

## 2021-07-29 DIAGNOSIS — S46211S Strain of muscle, fascia and tendon of other parts of biceps, right arm, sequela: Secondary | ICD-10-CM | POA: Diagnosis not present

## 2021-07-29 DIAGNOSIS — Z1329 Encounter for screening for other suspected endocrine disorder: Secondary | ICD-10-CM

## 2021-07-29 DIAGNOSIS — Z125 Encounter for screening for malignant neoplasm of prostate: Secondary | ICD-10-CM

## 2021-07-29 DIAGNOSIS — S46211A Strain of muscle, fascia and tendon of other parts of biceps, right arm, initial encounter: Secondary | ICD-10-CM

## 2021-07-29 DIAGNOSIS — K4090 Unilateral inguinal hernia, without obstruction or gangrene, not specified as recurrent: Secondary | ICD-10-CM | POA: Diagnosis not present

## 2021-07-29 DIAGNOSIS — E291 Testicular hypofunction: Secondary | ICD-10-CM

## 2021-07-29 DIAGNOSIS — Z0001 Encounter for general adult medical examination with abnormal findings: Secondary | ICD-10-CM

## 2021-07-29 DIAGNOSIS — Z1211 Encounter for screening for malignant neoplasm of colon: Secondary | ICD-10-CM

## 2021-07-29 DIAGNOSIS — Z1159 Encounter for screening for other viral diseases: Secondary | ICD-10-CM

## 2021-07-29 DIAGNOSIS — Z130 Encounter for screening for diseases of the blood and blood-forming organs and certain disorders involving the immune mechanism: Secondary | ICD-10-CM | POA: Diagnosis not present

## 2021-07-29 DIAGNOSIS — Z114 Encounter for screening for human immunodeficiency virus [HIV]: Secondary | ICD-10-CM

## 2021-07-29 DIAGNOSIS — Z13818 Encounter for screening for other digestive system disorders: Secondary | ICD-10-CM

## 2021-07-29 LAB — COMPREHENSIVE METABOLIC PANEL
ALT: 21 U/L (ref 0–53)
AST: 20 U/L (ref 0–37)
Albumin: 4.5 g/dL (ref 3.5–5.2)
Alkaline Phosphatase: 71 U/L (ref 39–117)
BUN: 19 mg/dL (ref 6–23)
CO2: 27 mEq/L (ref 19–32)
Calcium: 9.1 mg/dL (ref 8.4–10.5)
Chloride: 106 mEq/L (ref 96–112)
Creatinine, Ser: 1.05 mg/dL (ref 0.40–1.50)
GFR: 82.57 mL/min (ref >60.00)
Glucose, Bld: 99 mg/dL (ref 70–99)
Potassium: 3.6 mEq/L (ref 3.5–5.1)
Sodium: 140 mEq/L (ref 135–145)
Total Bilirubin: 0.3 mg/dL (ref 0.2–1.2)
Total Protein: 6.7 g/dL (ref 6.0–8.3)

## 2021-07-29 LAB — LIPID PANEL
Cholesterol: 173 mg/dL (ref 0–200)
HDL: 57.6 mg/dL (ref >39.00)
LDL Cholesterol: 93 mg/dL (ref 0–99)
NonHDL: 115.73
Total CHOL/HDL Ratio: 3
Triglycerides: 115 mg/dL (ref 0.0–149.0)
VLDL: 23 mg/dL (ref 0.0–40.0)

## 2021-07-29 LAB — HEMOGLOBIN A1C: Hgb A1c MFr Bld: 5.9 % (ref 4.6–6.5)

## 2021-07-29 LAB — CBC WITH DIFFERENTIAL/PLATELET
Basophils Absolute: 0 10*3/uL (ref 0.0–0.1)
Basophils Relative: 0.4 % (ref 0.0–3.0)
Eosinophils Absolute: 0.1 10*3/uL (ref 0.0–0.7)
Eosinophils Relative: 2 % (ref 0.0–5.0)
HCT: 40.7 % (ref 39.0–52.0)
Hemoglobin: 14 g/dL (ref 13.0–17.0)
Lymphocytes Relative: 32.1 % (ref 12.0–46.0)
Lymphs Abs: 1.3 10*3/uL (ref 0.7–4.0)
MCHC: 34.4 g/dL (ref 30.0–36.0)
MCV: 91.1 fl (ref 78.0–100.0)
Monocytes Absolute: 0.3 10*3/uL (ref 0.1–1.0)
Monocytes Relative: 8.1 % (ref 3.0–12.0)
Neutro Abs: 2.4 10*3/uL (ref 1.4–7.7)
Neutrophils Relative %: 57.4 % (ref 43.0–77.0)
Platelets: 179 10*3/uL (ref 150.0–400.0)
RBC: 4.47 Mil/uL (ref 4.22–5.81)
RDW: 12.9 % (ref 11.5–15.5)
WBC: 4.1 10*3/uL (ref 4.0–10.5)

## 2021-07-29 LAB — PSA: PSA: 2.62 ng/mL (ref 0.10–4.00)

## 2021-07-29 NOTE — Patient Instructions (Addendum)
Please call   ?Phone: [phone] to schedule colonoscopy ?Health Maintenance, Male ?Adopting a healthy lifestyle and getting preventive care are important in promoting health and wellness. Ask your health care provider about: ?The right schedule for you to have regular tests and exams. ?Things you can do on your own to prevent diseases and keep yourself healthy. ?What should I know about diet, weight, and exercise? ?Eat a healthy diet ? ?Eat a diet that includes plenty of vegetables, fruits, low-fat dairy products, and lean protein. ?Do not eat a lot of foods that are high in solid fats, added sugars, or sodium. ?Maintain a healthy weight ?Body mass index (BMI) is a measurement that can be used to identify possible weight problems. It estimates body fat based on height and weight. Your health care provider can help determine your BMI and help you achieve or maintain a healthy weight. ?Get regular exercise ?Get regular exercise. This is one of the most important things you can do for your health. Most adults should: ?Exercise for at least 150 minutes each week. The exercise should increase your heart rate and make you sweat (moderate-intensity exercise). ?Do strengthening exercises at least twice a week. This is in addition to the moderate-intensity exercise. ?Spend less time sitting. Even light physical activity can be beneficial. ?Watch cholesterol and blood lipids ?Have your blood tested for lipids and cholesterol at 51 years of age, then have this test every 5 years. ?You may need to have your cholesterol levels checked more often if: ?Your lipid or cholesterol levels are high. ?You are older than 51 years of age. ?You are at high risk for heart disease. ?What should I know about cancer screening? ?Many types of cancers can be detected early and may often be prevented. Depending on your health history and family history, you may need to have cancer screening at various ages. This may include screening  for: ?Colorectal cancer. ?Prostate cancer. ?Skin cancer. ?Lung cancer. ?What should I know about heart disease, diabetes, and high blood pressure? ?Blood pressure and heart disease ?High blood pressure causes heart disease and increases the risk of stroke. This is more likely to develop in people who have high blood pressure readings or are overweight. ?Talk with your health care provider about your target blood pressure readings. ?Have your blood pressure checked: ?Every 3-5 years if you are 61-23 years of age. ?Every year if you are 64 years old or older. ?If you are between the ages of 62 and 11 and are a current or former smoker, ask your health care provider if you should have a one-time screening for abdominal aortic aneurysm (AAA). ?Diabetes ?Have regular diabetes screenings. This checks your fasting blood sugar level. Have the screening done: ?Once every three years after age 81 if you are at a normal weight and have a low risk for diabetes. ?More often and at a younger age if you are overweight or have a high risk for diabetes. ?What should I know about preventing infection? ?Hepatitis B ?If you have a higher risk for hepatitis B, you should be screened for this virus. Talk with your health care provider to find out if you are at risk for hepatitis B infection. ?Hepatitis C ?Blood testing is recommended for: ?Everyone born from [birth year] through 1965. ?Anyone with known risk factors for hepatitis C. ?Sexually transmitted infections (STIs) ?You should be screened each year for STIs, including gonorrhea and chlamydia, if: ?You are sexually active and are younger than 51 years of age. ?  You are older than 51 years of age and your health care provider tells you that you are at risk for this type of infection. ?Your sexual activity has changed since you were last screened, and you are at increased risk for chlamydia or gonorrhea. Ask your health care provider if you are at risk. ?Ask your health care provider about  whether you are at high risk for HIV. Your health care provider may recommend a prescription medicine to help prevent HIV infection. If you choose to take medicine to prevent HIV, you should first get tested for HIV. You should then be tested every 3 months for as long as you are taking the medicine. ?Follow these instructions at home: ?Alcohol use ?Do not drink alcohol if your health care provider tells you not to drink. ?If you drink alcohol: ?Limit how much you have to 0-2 drinks a day. ?Know how much alcohol is in your drink. In the [place] one drink equals one 12 oz bottle of beer (355 mL), one 5 oz glass of wine (148 mL), or one 1? oz glass of hard liquor (44 mL). ?Lifestyle ?Do not use any products that contain nicotine or tobacco. These products include cigarettes, chewing tobacco, and vaping devices, such as e-cigarettes. If you need help quitting, ask your health care provider. ?Do not use street drugs. ?Do not share needles. ?Ask your health care provider for help if you need support or information about quitting drugs. ?General instructions ?Schedule regular health, dental, and eye exams. ?Stay current with your vaccines. ?Tell your health care provider if: ?You often feel depressed. ?You have ever been abused or do not feel safe at home. ?Summary ?Adopting a healthy lifestyle and getting preventive care are important in promoting health and wellness. ?Follow your health care provider's instructions about healthy diet, exercising, and getting tested or screened for diseases. ?Follow your health care provider's instructions on monitoring your cholesterol and blood pressure. ?This information is not intended to replace advice given to you by your health care provider. Make sure you discuss any questions you have with your health care provider. ?Document Revised: 09/29/2020 Document Reviewed: 09/29/2020 ?Elsevier Patient Education ? 2022 Elsevier Inc[employer] ? ?

## 2021-07-29 NOTE — Progress Notes (Signed)
Darin Hart 51 y.o.   Chief Complaint  Patient presents with   Follow-up    Back pain and Testerone testing. Pt would STI screening.    HISTORY OF PRESENT ILLNESS: This is a 51 y.o. male states he is here for annual physical. Works out regularly. Recently sustained tear of biceps tendon.  MRI report reviewed with patient. Seen and evaluated by orthopedist.  Will need repair. Occasional low back pain but presently doing well. Complaining of left inguinal hernia.  Past medical history of repair of right inguinal hernia. No other complaints or medical concerns today.  HPI   Prior to Admission medications   Medication Sig Start Date End Date Taking? Authorizing Provider  albuterol (VENTOLIN HFA) 108 (90 Base) MCG/ACT inhaler Inhale 2 puffs into the lungs every 6 (six) hours as needed for wheezing or shortness of breath. Patient not taking: Reported on 07/29/2021 10/02/20   Georgina Quint, MD  methocarbamol (ROBAXIN) 500 MG tablet Take 1 tablet (500 mg total) by mouth every 8 (eight) hours as needed for muscle spasms. Patient not taking: Reported on 07/29/2021 11/20/20   Naida Sleight, PA-C  Multiple Vitamin (MULTIVITAMIN WITH MINERALS) TABS tablet Take 1 tablet by mouth daily. Patient not taking: Reported on 07/29/2021    [provider]  naproxen (NAPROSYN) 375 MG tablet Take 1 tablet (375 mg total) by mouth 2 (two) times daily. Patient not taking: Reported on 07/29/2021 06/28/21   Teressa Lower, PA-C  Omega-3 Fatty Acids (FISH OIL BURP-LESS PO) Take 2 capsules by mouth daily. Patient not taking: Reported on 07/29/2021    [provider]    Allergies  Allergen Reactions   Shellfish Allergy Anaphylaxis   Other Other (See Comments)    Migraines, itchy tongue, burning mouth All Tree Nuts   Penicillins     Pt was told he could not take this, his reaction is unknown Has patient had a PCN reaction causing immediate rash, facial/tongue/throat swelling, SOB or  lightheadedness with hypotension: Unknown Has patient had a PCN reaction causing severe rash involving mucus membranes or skin necrosis: Unknown Has patient had a PCN reaction that required hospitalization: NO Has patient had a PCN reaction occurring within the last 10 years: NO If all of the above answers are "NO", then may proceed with Cephalospo    Patient Active Problem List   Diagnosis Date Noted   Low back pain 01/23/2021   Chronic musculoskeletal pain 10/02/2020   History of asthma 10/02/2020   History of multiple allergies 10/02/2020   Low testosterone 02/07/2018   Screening for prostate cancer 11/28/2017    Past Medical History:  Diagnosis Date   Arthritis    Asthma    Heart murmur     Past Surgical History:  Procedure Laterality Date   HERNIA REPAIR     LEG SURGERY     RHINOPLASTY     spontaneous neurothroax      Social History   Socioeconomic History   Marital status: Married    Spouse name: Not on file   Number of children: Not on file   Years of education: Not on file   Highest education level: Not on file  Occupational History   Not on file  Tobacco Use   Smoking status: Never   Smokeless tobacco: Never  Vaping Use   Vaping Use: Never used  Substance and Sexual Activity   Alcohol use: Yes    Comment: OCCASIONALLY   Drug use: Never  Sexual activity: Yes    Partners: Female  Other Topics Concern   Not on file  Social History Narrative   Not on file   Social Determinants of Health   Financial Resource Strain: Not on file  Food Insecurity: Not on file  Transportation Needs: Not on file  Physical Activity: Not on file  Stress: Not on file  Social Connections: Not on file  Intimate Partner Violence: Not on file    Family History  Problem Relation Age of Onset   Hypertension Mother    Other Neg Hx        hypogonadism     Review of Systems  Constitutional: Negative.  Negative for chills and fever.  HENT: Negative.  Negative for  congestion and sore throat.   Respiratory: Negative.  Negative for cough and shortness of breath.   Cardiovascular: Negative.  Negative for chest pain and palpitations.  Gastrointestinal:  Negative for abdominal pain, heartburn, nausea and vomiting.  Musculoskeletal:  Positive for back pain.  Skin: Negative.  Negative for rash.  Neurological:  Negative for dizziness and headaches.  All other systems reviewed and are negative.  Today's Vitals   07/29/21 1449  BP: 122/60  Pulse: 75  SpO2: 97%  Weight: 188 lb (85.3 kg)  Height: 6' (1.829 m)   Body mass index is 25.5 kg/m.  Physical Exam Vitals reviewed.  Constitutional:      Appearance: Normal appearance.  HENT:     Head: Normocephalic.     Right Ear: Tympanic membrane, ear canal and external ear normal.     Left Ear: Tympanic membrane, ear canal and external ear normal.     Mouth/Throat:     Mouth: Mucous membranes are moist.     Pharynx: Oropharynx is clear.  Eyes:     Extraocular Movements: Extraocular movements intact.     Conjunctiva/sclera: Conjunctivae normal.     Pupils: Pupils are equal, round, and reactive to light.  Cardiovascular:     Rate and Rhythm: Normal rate and regular rhythm.     Pulses: Normal pulses.     Heart sounds: Normal heart sounds.  Pulmonary:     Effort: Pulmonary effort is normal.     Breath sounds: Normal breath sounds.  Abdominal:     General: Bowel sounds are normal. There is no distension.     Palpations: Abdomen is soft.     Tenderness: There is no abdominal tenderness.     Hernia: A hernia is present. Hernia is present in the left inguinal area.  Musculoskeletal:     Cervical back: Neck supple. No tenderness.     Comments: Right biceps tear deformity noted  Lymphadenopathy:     Cervical: No cervical adenopathy.  Skin:    General: Skin is warm and dry.     Capillary Refill: Capillary refill takes less than 2 seconds.  Neurological:     General: No focal deficit present.      Mental Status: He is alert and oriented to person, place, and time.  Psychiatric:        Mood and Affect: Mood normal.        Behavior: Behavior normal.    ASSESSMENT & PLAN: Problem List Items Addressed This Visit   None Visit Diagnoses     Encounter for general adult medical examination with abnormal findings    -  Primary   Left inguinal hernia       Relevant Orders   Ambulatory referral to General Surgery  Traumatic partial tear of right biceps tendon, sequela       Hypogonadism in male       Relevant Orders   TestT+TestF+SHBG   Prostate cancer screening       Relevant Orders   PSA(Must document that pt has been informed of limitations of PSA testing.)   Need for hepatitis C screening test       Relevant Orders   Hepatitis C antibody screen   Screening for HIV (human immunodeficiency virus)       Relevant Orders   HIV antibody   Colon cancer screening       Relevant Orders   Ambulatory referral to Gastroenterology   Screening for deficiency anemia       Relevant Orders   CBC with Differential   Screening for lipoid disorders       Relevant Orders   Lipid panel   Screening for endocrine, metabolic and immunity disorder       Relevant Orders   Comprehensive metabolic panel   Hemoglobin A1c     Modifiable risk factors discussed with patient. Anticipatory guidance according to age provided. The following topics were also discussed: Social Determinants of Health Smoking.  Non-smoker Diet and nutrition Benefits of exercise Cancer screening and need for colon cancer screening with colonoscopy Vaccinations recommendations Cardiovascular risk assessment Diagnosis of left inguinal hernia and need for surgical evaluation Mental health including depression and anxiety Fall and accident prevention  Patient Instructions  Please call   Phone: [phone] to schedule colonoscopy Health Maintenance, Male Adopting a healthy lifestyle and getting preventive care are  important in promoting health and wellness. Ask your health care provider about: The right schedule for you to have regular tests and exams. Things you can do on your own to prevent diseases and keep yourself healthy. What should I know about diet, weight, and exercise? Eat a healthy diet  Eat a diet that includes plenty of vegetables, fruits, low-fat dairy products, and lean protein. Do not eat a lot of foods that are high in solid fats, added sugars, or sodium. Maintain a healthy weight Body mass index (BMI) is a measurement that can be used to identify possible weight problems. It estimates body fat based on height and weight. Your health care provider can help determine your BMI and help you achieve or maintain a healthy weight. Get regular exercise Get regular exercise. This is one of the most important things you can do for your health. Most adults should: Exercise for at least 150 minutes each week. The exercise should increase your heart rate and make you sweat (moderate-intensity exercise). Do strengthening exercises at least twice a week. This is in addition to the moderate-intensity exercise. Spend less time sitting. Even light physical activity can be beneficial. Watch cholesterol and blood lipids Have your blood tested for lipids and cholesterol at 51 years of age, then have this test every 5 years. You may need to have your cholesterol levels checked more often if: Your lipid or cholesterol levels are high. You are older than 51 years of age. You are at high risk for heart disease. What should I know about cancer screening? Many types of cancers can be detected early and may often be prevented. Depending on your health history and family history, you may need to have cancer screening at various ages. This may include screening for: Colorectal cancer. Prostate cancer. Skin cancer. Lung cancer. What should I know about heart disease, diabetes, and high blood pressure?  Blood  pressure and heart disease High blood pressure causes heart disease and increases the risk of stroke. This is more likely to develop in people who have high blood pressure readings or are overweight. Talk with your health care provider about your target blood pressure readings. Have your blood pressure checked: Every 3-5 years if you are 2918-51 years of age. Every year if you are 51 years old or older. If you are between the ages of 5465 and 6875 and are a current or former smoker, ask your health care provider if you should have a one-time screening for abdominal aortic aneurysm (AAA). Diabetes Have regular diabetes screenings. This checks your fasting blood sugar level. Have the screening done: Once every three years after age 51 if you are at a normal weight and have a low risk for diabetes. More often and at a younger age if you are overweight or have a high risk for diabetes. What should I know about preventing infection? Hepatitis B If you have a higher risk for hepatitis B, you should be screened for this virus. Talk with your health care provider to find out if you are at risk for hepatitis B infection. Hepatitis C Blood testing is recommended for: Everyone born from [birth year] through 1965. Anyone with known risk factors for hepatitis C. Sexually transmitted infections (STIs) You should be screened each year for STIs, including gonorrhea and chlamydia, if: You are sexually active and are younger than 51 years of age. You are older than 51 years of age and your health care provider tells you that you are at risk for this type of infection. Your sexual activity has changed since you were last screened, and you are at increased risk for chlamydia or gonorrhea. Ask your health care provider if you are at risk. Ask your health care provider about whether you are at high risk for HIV. Your health care provider may recommend a prescription medicine to help prevent HIV infection. If you choose to take  medicine to prevent HIV, you should first get tested for HIV. You should then be tested every 3 months for as long as you are taking the medicine. Follow these instructions at home: Alcohol use Do not drink alcohol if your health care provider tells you not to drink. If you drink alcohol: Limit how much you have to 0-2 drinks a day. Know how much alcohol is in your drink. In the [place] one drink equals one 12 oz bottle of beer (355 mL), one 5 oz glass of wine (148 mL), or one 1 oz glass of hard liquor (44 mL). Lifestyle Do not use any products that contain nicotine or tobacco. These products include cigarettes, chewing tobacco, and vaping devices, such as e-cigarettes. If you need help quitting, ask your health care provider. Do not use street drugs. Do not share needles. Ask your health care provider for help if you need support or information about quitting drugs. General instructions Schedule regular health, dental, and eye exams. Stay current with your vaccines. Tell your health care provider if: You often feel depressed. You have ever been abused or do not feel safe at home. Summary Adopting a healthy lifestyle and getting preventive care are important in promoting health and wellness. Follow your health care provider's instructions about healthy diet, exercising, and getting tested or screened for diseases. Follow your health care provider's instructions on monitoring your cholesterol and blood pressure. This information is not intended to replace advice given to you by your health  care provider. Make sure you discuss any questions you have with your health care provider. Document Revised: 09/29/2020 Document Reviewed: 09/29/2020 Elsevier Patient Education  2022 Elsevier Inc.      Edwina Barth, MD [facility]

## 2021-07-30 LAB — HEPATITIS C ANTIBODY
Hepatitis C Ab: NONREACTIVE
SIGNAL TO CUT-OFF: 0.02 (ref <1.00)

## 2021-07-30 LAB — HIV ANTIBODY (ROUTINE TESTING W REFLEX): HIV 1&2 Ab, 4th Generation: NONREACTIVE

## 2021-08-03 ENCOUNTER — Encounter: Payer: Self-pay | Admitting: Emergency Medicine

## 2021-08-03 LAB — TESTT+TESTF+SHBG
Sex Hormone Binding: 22.6 nmol/L (ref 19.3–76.4)
Testosterone, Free: 4.1 pg/mL — ABNORMAL LOW (ref 7.2–24.0)
Testosterone, Total, LC/MS: 250.4 ng/dL — ABNORMAL LOW (ref 264.0–916.0)

## 2021-08-12 ENCOUNTER — Ambulatory Visit (INDEPENDENT_AMBULATORY_CARE_PROVIDER_SITE_OTHER): Payer: 59 | Admitting: Orthopaedic Surgery

## 2021-08-12 ENCOUNTER — Encounter: Payer: Self-pay | Admitting: Orthopaedic Surgery

## 2021-08-12 ENCOUNTER — Telehealth: Payer: Self-pay | Admitting: Emergency Medicine

## 2021-08-12 DIAGNOSIS — S46211A Strain of muscle, fascia and tendon of other parts of biceps, right arm, initial encounter: Secondary | ICD-10-CM | POA: Diagnosis not present

## 2021-08-12 NOTE — Telephone Encounter (Signed)
Okay to mail results please.  Thanks.

## 2021-08-12 NOTE — Telephone Encounter (Signed)
Pt requesting hard copy of 07-29-2021 lab results mailed to home address ?

## 2021-08-13 NOTE — Progress Notes (Signed)
? ?  Office Visit Note ?  ?Patient: Darin Hart           ?Date of Birth: [birth year]           ?MRN: [id number] ?Visit Date: 08/12/2021 ?             ?Requested by: Georgina Quint, MD ?[address] ?[place],  [place] [address] ?PCP: Georgina Quint, MD ? ? ?Assessment & Plan: ?Visit Diagnoses:  ?1. Biceps rupture, proximal, right, initial encounter   ? ? ?Plan: MRI consistent with proximal biceps rupture with retraction.  The remaining portions of the MRI are essentially unchanged.  Given these findings, treatment options discussed and based on our discussion he elects to move forward with biceps tenodesis.  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  There is small but real risk of neurovascular injury.  All questions answered.  Eunice Blase will call the patient.   ? ?Follow-Up Instructions: No follow-ups on file.  ? ?Orders:  ?No orders of the defined types were placed in this encounter. ? ?No orders of the defined types were placed in this encounter. ? ? ? ? Procedures: ?No procedures performed ? ? ?Clinical Data: ?No additional findings. ? ? ?Subjective: ?Chief Complaint  ?Patient presents with  ? Right Shoulder - Follow-up  ? ? ?HPI ? ?Thayer Ohm returns today to discuss right shoulder MRI.  Reporting a lot of pain and weakness and discomfort while lifting at work and while working out at [employer]  ? ?Review of Systems ? ? ?Objective: ?Vital Signs: There were no vitals taken for this visit. ? ?Physical Exam ? ?Ortho Exam ? ?Exam of right shoulder girdle ?- mild popeye  ?- ttp proximal biceps and bicipital groove ?- RC strength good to MMT ? ?Specialty Comments:  ?No specialty comments available. ? ?Imaging: ?No results found. ? ? ?PMFS History: ?Patient Active Problem List  ? Diagnosis Date Noted  ? Low back pain 01/23/2021  ? Chronic musculoskeletal pain 10/02/2020  ? History of asthma 10/02/2020  ? History of multiple allergies 10/02/2020  ? Low testosterone 02/07/2018  ?  Screening for prostate cancer 11/28/2017  ? ?Past Medical History:  ?Diagnosis Date  ? Arthritis   ? Asthma   ? Heart murmur   ?  ?Family History  ?Problem Relation Age of Onset  ? Hypertension Mother   ? Other Neg Hx   ?     hypogonadism  ?  ?Past Surgical History:  ?Procedure Laterality Date  ? HERNIA REPAIR    ? LEG SURGERY    ? RHINOPLASTY    ? spontaneous neurothroax    ? ?Social History  ? ?Occupational History  ? Not on file  ?Tobacco Use  ? Smoking status: Never  ? Smokeless tobacco: Never  ?Vaping Use  ? Vaping Use: Never used  ?Substance and Sexual Activity  ? Alcohol use: Yes  ?  Comment: OCCASIONALLY  ? Drug use: Never  ? Sexual activity: Yes  ?  Partners: Female  ? ? ? ? ? ? ?

## 2021-08-13 NOTE — Telephone Encounter (Signed)
Pts results placed in outgoing mail on 3-23 ?

## 2021-08-19 ENCOUNTER — Encounter: Payer: Self-pay | Admitting: Endocrinology

## 2021-08-21 ENCOUNTER — Encounter: Payer: Self-pay | Admitting: Emergency Medicine

## 2021-08-24 ENCOUNTER — Other Ambulatory Visit: Payer: Self-pay | Admitting: Emergency Medicine

## 2021-08-24 DIAGNOSIS — E291 Testicular hypofunction: Secondary | ICD-10-CM

## 2021-08-24 NOTE — Telephone Encounter (Signed)
I will place an Endocrinology referral for hypogonadism.  I do not recommend testosterone supplementation due to hypertension and cardiovascular side effects not to mention prostate issues.  Thanks.

## 2021-09-10 ENCOUNTER — Telehealth: Payer: Self-pay | Admitting: Orthopaedic Surgery

## 2021-09-10 NOTE — Telephone Encounter (Signed)
Patient is scheduled for right biceps tenodesis with Dr. Roda Shutters on 09-30-21 at [facility].  Patient called and left a message asking how long he can expect to be out (out of work).   ?

## 2021-09-11 NOTE — Telephone Encounter (Signed)
2 weeks and then depends on if light duty is available or not

## 2021-09-15 NOTE — Telephone Encounter (Signed)
Message sent

## 2021-09-23 ENCOUNTER — Encounter (HOSPITAL_BASED_OUTPATIENT_CLINIC_OR_DEPARTMENT_OTHER): Payer: Self-pay | Admitting: Orthopaedic Surgery

## 2021-09-23 ENCOUNTER — Other Ambulatory Visit: Payer: Self-pay

## 2021-09-30 ENCOUNTER — Other Ambulatory Visit: Payer: Self-pay

## 2021-09-30 ENCOUNTER — Encounter (HOSPITAL_BASED_OUTPATIENT_CLINIC_OR_DEPARTMENT_OTHER): Payer: Self-pay | Admitting: Orthopaedic Surgery

## 2021-09-30 ENCOUNTER — Ambulatory Visit (HOSPITAL_BASED_OUTPATIENT_CLINIC_OR_DEPARTMENT_OTHER): Payer: 59 | Admitting: Anesthesiology

## 2021-09-30 ENCOUNTER — Encounter (HOSPITAL_BASED_OUTPATIENT_CLINIC_OR_DEPARTMENT_OTHER)
Admission: RE | Disposition: A | Discharge status: Home / Self Care | Payer: Self-pay | Source: Ambulatory Visit | Attending: Orthopaedic Surgery

## 2021-09-30 ENCOUNTER — Ambulatory Visit (HOSPITAL_COMMUNITY): Payer: 59

## 2021-09-30 ENCOUNTER — Ambulatory Visit (HOSPITAL_BASED_OUTPATIENT_CLINIC_OR_DEPARTMENT_OTHER)
Admission: RE | Admit: 2021-09-30 | Discharge: 2021-09-30 | Disposition: A | Discharge status: Home / Self Care | Payer: 59 | Source: Ambulatory Visit | Attending: Orthopaedic Surgery | Admitting: Orthopaedic Surgery

## 2021-09-30 DIAGNOSIS — J45909 Unspecified asthma, uncomplicated: Secondary | ICD-10-CM | POA: Diagnosis not present

## 2021-09-30 DIAGNOSIS — S46211A Strain of muscle, fascia and tendon of other parts of biceps, right arm, initial encounter: Secondary | ICD-10-CM | POA: Insufficient documentation

## 2021-09-30 DIAGNOSIS — X58XXXA Exposure to other specified factors, initial encounter: Secondary | ICD-10-CM | POA: Insufficient documentation

## 2021-09-30 HISTORY — DX: Strain of muscle, fascia and tendon of long head of biceps, unspecified arm, initial encounter: S46.119A

## 2021-09-30 HISTORY — PX: BICEPT TENODESIS: SHX5116

## 2021-09-30 IMAGING — CR DG FINGER LITTLE 2+V*L*
3 series · 3 of 3 positions shown · non-contrast
Comparison: None Available.

CLINICAL DATA: Pain edema

EXAM:
LEFT LITTLE FINGER 2+V

[finger ap]
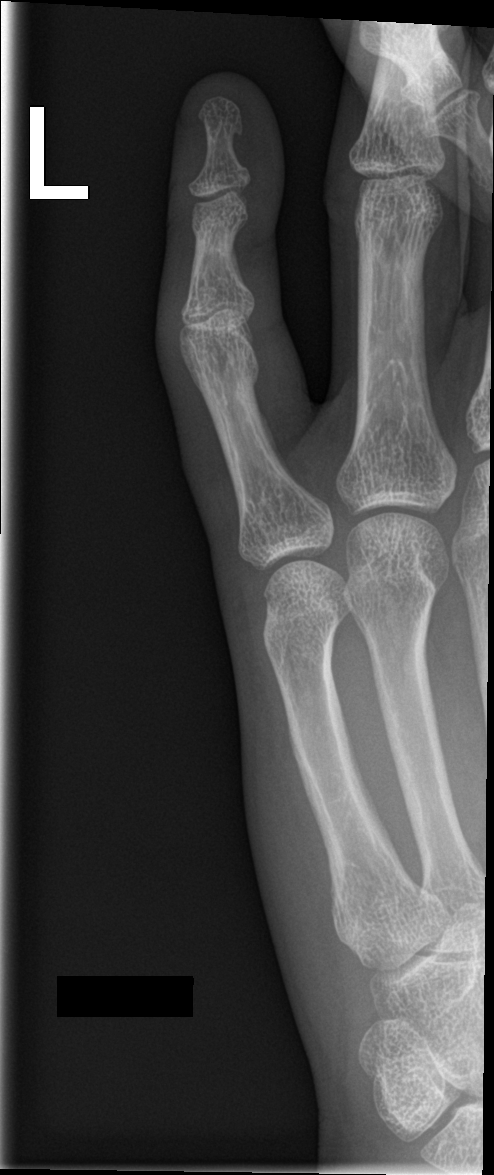

[finger obl]
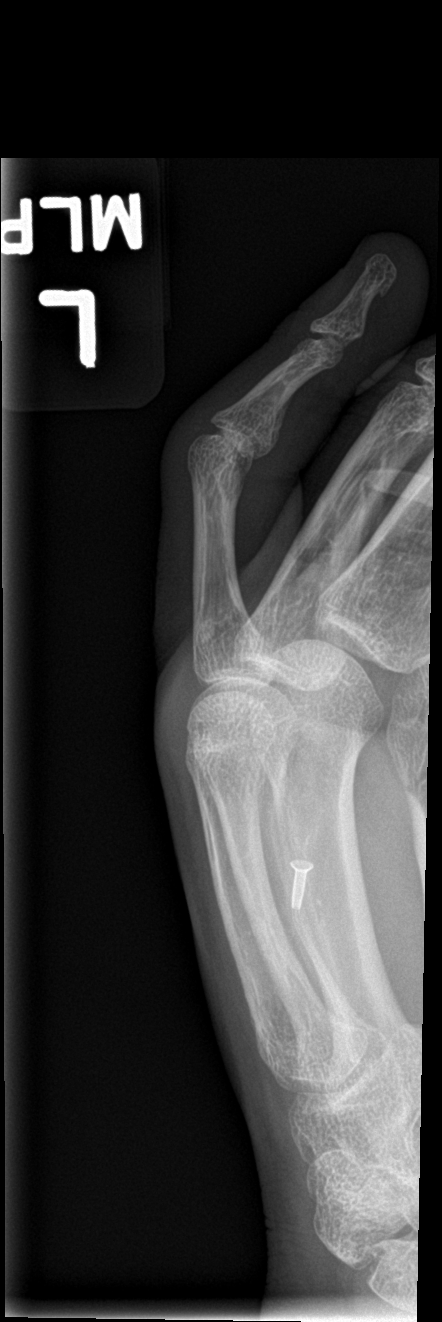

[finger lat]
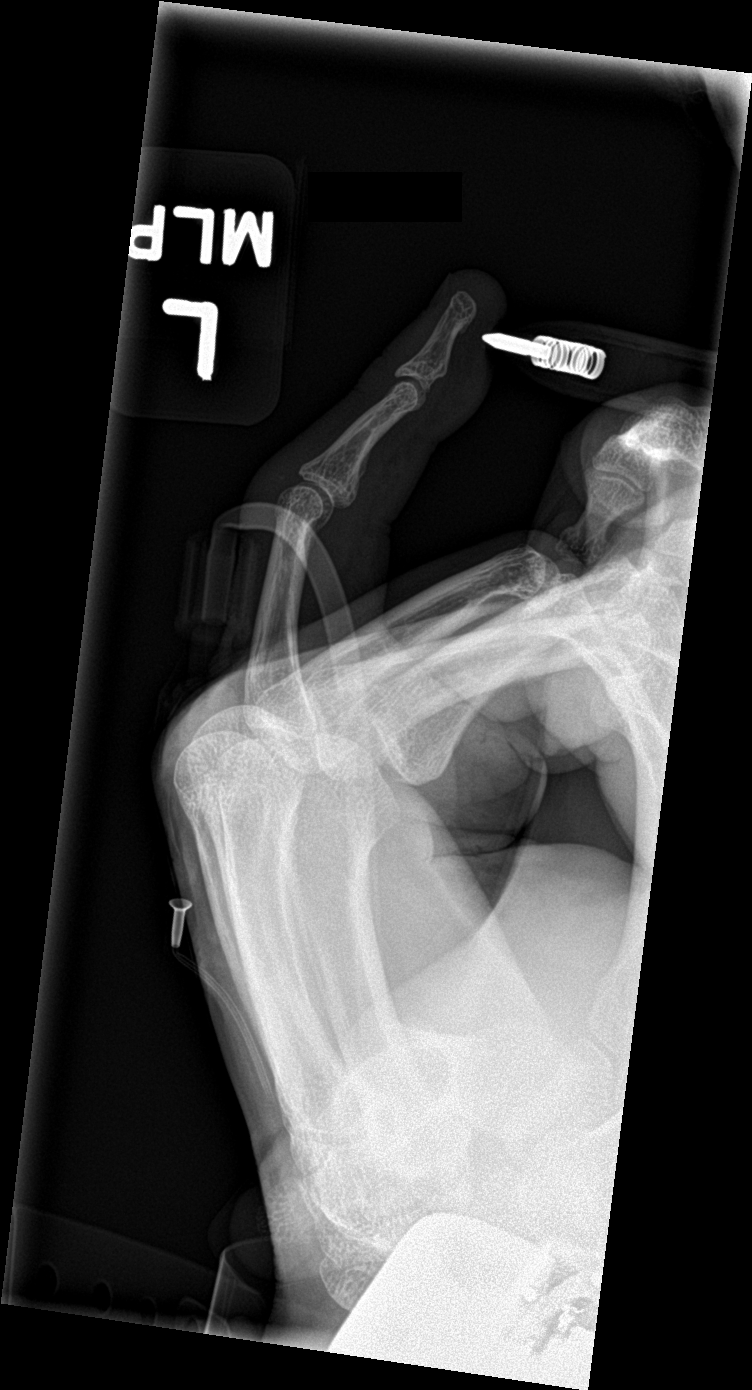

[3 of 3 positions shown; findings below may reference images not displayed]

FINDINGS: No fracture or dislocation of the fourth digit.  No foreign body.
IMPRESSION: No fracture or dislocation

## 2021-09-30 SURGERY — TENODESIS, BICEPS
Anesthesia: Regional | Site: Shoulder | Laterality: Right

## 2021-09-30 MED ORDER — SUGAMMADEX SODIUM 200 MG/2ML IV SOLN
INTRAVENOUS | Status: DC | PRN
  Administered 2021-09-30: 200 mg via INTRAVENOUS

## 2021-09-30 MED ORDER — DEXAMETHASONE SODIUM PHOSPHATE 10 MG/ML IJ SOLN
INTRAMUSCULAR | Status: AC
Start: 2021-09-30 — End: ?
  Filled 2021-09-30: qty 1

## 2021-09-30 MED ORDER — EPHEDRINE SULFATE (PRESSORS) 50 MG/ML IJ SOLN
INTRAMUSCULAR | Status: DC | PRN
  Administered 2021-09-30 (×2): 5 mg via INTRAVENOUS

## 2021-09-30 MED ORDER — ACETAMINOPHEN 500 MG PO TABS
ORAL_TABLET | ORAL | Status: AC
Start: 2021-09-30 — End: ?
  Filled 2021-09-30: qty 2

## 2021-09-30 MED ORDER — ONDANSETRON HCL 4 MG PO TABS
4.0000 mg | ORAL_TABLET | Freq: Three times a day (TID) | ORAL | 0 refills | Status: AC | PRN
Start: 2021-09-30 — End: ?

## 2021-09-30 MED ORDER — ONDANSETRON HCL 4 MG/2ML IJ SOLN
INTRAMUSCULAR | Status: DC | PRN
  Administered 2021-09-30: 4 mg via INTRAVENOUS

## 2021-09-30 MED ORDER — ROCURONIUM BROMIDE 100 MG/10ML IV SOLN
INTRAVENOUS | Status: DC | PRN
  Administered 2021-09-30: 40 mg via INTRAVENOUS

## 2021-09-30 MED ORDER — BUPIVACAINE LIPOSOME 1.3 % IJ SUSP
INTRAMUSCULAR | Status: DC | PRN
  Administered 2021-09-30: 10 mL via PERINEURAL

## 2021-09-30 MED ORDER — FENTANYL CITRATE (PF) 100 MCG/2ML IJ SOLN: 25.0000 ug | INTRAMUSCULAR | Status: DC | PRN

## 2021-09-30 MED ORDER — PHENYLEPHRINE HCL (PRESSORS) 10 MG/ML IV SOLN
INTRAVENOUS | Status: DC | PRN
  Administered 2021-09-30 (×3): 80 ug via INTRAVENOUS

## 2021-09-30 MED ORDER — KETOROLAC TROMETHAMINE 30 MG/ML IJ SOLN: 30.0000 mg | Freq: Once | INTRAMUSCULAR | Status: DC | PRN

## 2021-09-30 MED ORDER — LIDOCAINE 2% (20 MG/ML) 5 ML SYRINGE
INTRAMUSCULAR | Status: AC
  Filled 2021-09-30: qty 5

## 2021-09-30 MED ORDER — BUPIVACAINE HCL (PF) 0.5 % IJ SOLN
INTRAMUSCULAR | Status: DC | PRN
  Administered 2021-09-30: 15 mL via PERINEURAL

## 2021-09-30 MED ORDER — LIDOCAINE HCL (CARDIAC) PF 100 MG/5ML IV SOSY
PREFILLED_SYRINGE | INTRAVENOUS | Status: DC | PRN
  Administered 2021-09-30: 40 mg via INTRAVENOUS

## 2021-09-30 MED ORDER — OXYCODONE-ACETAMINOPHEN 5-325 MG PO TABS: 1.0000 | ORAL_TABLET | Freq: Two times a day (BID) | ORAL | 0 refills | Status: DC | PRN

## 2021-09-30 MED ORDER — FENTANYL CITRATE (PF) 100 MCG/2ML IJ SOLN
INTRAMUSCULAR | Status: AC
  Filled 2021-09-30: qty 2

## 2021-09-30 MED ORDER — OXYCODONE HCL 5 MG/5ML PO SOLN: 5.0000 mg | Freq: Once | ORAL | Status: DC | PRN

## 2021-09-30 MED ORDER — PROPOFOL 10 MG/ML IV BOLUS
INTRAVENOUS | Status: AC
  Filled 2021-09-30: qty 20

## 2021-09-30 MED ORDER — VANCOMYCIN HCL 500 MG IV SOLR
INTRAVENOUS | Status: AC
  Filled 2021-09-30: qty 10

## 2021-09-30 MED ORDER — LACTATED RINGERS IV SOLN: INTRAVENOUS | Status: DC

## 2021-09-30 MED ORDER — ONDANSETRON HCL 4 MG/2ML IJ SOLN
INTRAMUSCULAR | Status: AC
  Filled 2021-09-30: qty 2

## 2021-09-30 MED ORDER — CEFAZOLIN SODIUM-DEXTROSE 2-4 GM/100ML-% IV SOLN
2.0000 g | INTRAVENOUS | Status: AC
  Administered 2021-09-30: 2 g via INTRAVENOUS

## 2021-09-30 MED ORDER — OXYCODONE HCL 5 MG PO TABS: 5.0000 mg | ORAL_TABLET | Freq: Once | ORAL | Status: DC | PRN

## 2021-09-30 MED ORDER — PROPOFOL 10 MG/ML IV BOLUS
INTRAVENOUS | Status: DC | PRN
  Administered 2021-09-30: 200 mg via INTRAVENOUS

## 2021-09-30 MED ORDER — METHOCARBAMOL 500 MG PO TABS: 500.0000 mg | ORAL_TABLET | Freq: Four times a day (QID) | ORAL | 2 refills | Status: DC | PRN

## 2021-09-30 MED ORDER — CEFAZOLIN SODIUM-DEXTROSE 2-4 GM/100ML-% IV SOLN
INTRAVENOUS | Status: AC
  Filled 2021-09-30: qty 100

## 2021-09-30 MED ORDER — ACETAMINOPHEN 500 MG PO TABS
1000.0000 mg | ORAL_TABLET | Freq: Once | ORAL | Status: AC
  Administered 2021-09-30: 1000 mg via ORAL

## 2021-09-30 MED ORDER — FENTANYL CITRATE (PF) 100 MCG/2ML IJ SOLN
100.0000 ug | Freq: Once | INTRAMUSCULAR | Status: DC
Start: 2021-09-30 — End: 2021-09-30

## 2021-09-30 MED ORDER — MIDAZOLAM HCL 2 MG/2ML IJ SOLN
2.0000 mg | Freq: Once | INTRAMUSCULAR | Status: AC
  Administered 2021-09-30: 2 mg via INTRAVENOUS

## 2021-09-30 MED ORDER — DEXAMETHASONE SODIUM PHOSPHATE 4 MG/ML IJ SOLN
INTRAMUSCULAR | Status: DC | PRN
Start: 2021-09-30 — End: 2021-09-30
  Administered 2021-09-30: 10 mg via INTRAVENOUS

## 2021-09-30 MED ORDER — AMISULPRIDE (ANTIEMETIC) 5 MG/2ML IV SOLN: 10.0000 mg | Freq: Once | INTRAVENOUS | Status: DC | PRN

## 2021-09-30 MED ORDER — MIDAZOLAM HCL 2 MG/2ML IJ SOLN
INTRAMUSCULAR | Status: AC
  Filled 2021-09-30: qty 2

## 2021-09-30 SURGICAL SUPPLY — 83 items
ANCHOR SUT 1.8 FIBERTAK SB KL (Anchor) ×1 IMPLANT
ANCHOR SUT BIO SW 4.75X19.1 (Anchor) ×1 IMPLANT
BENZOIN TINCTURE PRP APPL 2/3 (GAUZE/BANDAGES/DRESSINGS) IMPLANT
BLADE SURG 15 STRL LF DISP TIS (BLADE) IMPLANT
BLADE SURG 15 STRL SS (BLADE)
BURR OVAL 8 FLU 4.0X13 (MISCELLANEOUS) ×2 IMPLANT
CANNULA 5.75X71 LONG (CANNULA) ×2 IMPLANT
CANNULA SHOULDER 7CM (CANNULA) ×2 IMPLANT
CANNULA TWIST IN 8.25X7CM (CANNULA) IMPLANT
COOLER ICEMAN CLASSIC (MISCELLANEOUS) ×2 IMPLANT
DERMABOND ADVANCED (GAUZE/BANDAGES/DRESSINGS)
DERMABOND ADVANCED .7 DNX12 (GAUZE/BANDAGES/DRESSINGS) IMPLANT
DISSECTOR  3.8MM X 13CM (MISCELLANEOUS) ×1
DISSECTOR 3.8MM X 13CM (MISCELLANEOUS) ×1 IMPLANT
DRAPE IMP U-DRAPE 54X76 (DRAPES) ×2 IMPLANT
DRAPE INCISE IOBAN 66X45 STRL (DRAPES) ×2 IMPLANT
DRAPE SHOULDER BEACH CHAIR (DRAPES) ×2 IMPLANT
DRAPE STERI 35X30 U-POUCH (DRAPES) ×2 IMPLANT
DRAPE U-SHAPE 47X51 STRL (DRAPES) ×2 IMPLANT
DRSG PAD ABDOMINAL 8X10 ST (GAUZE/BANDAGES/DRESSINGS) ×2 IMPLANT
DRSG TEGADERM 4X4.75 (GAUZE/BANDAGES/DRESSINGS) ×2 IMPLANT
DURAPREP 26ML APPLICATOR (WOUND CARE) ×2 IMPLANT
ELECT REM PT RETURN 9FT ADLT (ELECTROSURGICAL) ×2
ELECTRODE REM PT RTRN 9FT ADLT (ELECTROSURGICAL) ×1 IMPLANT
FIBER TAPE 2MM (SUTURE) IMPLANT
GAUZE SPONGE 4X4 12PLY STRL (GAUZE/BANDAGES/DRESSINGS) ×2 IMPLANT
GAUZE XEROFORM 1X8 LF (GAUZE/BANDAGES/DRESSINGS) ×2 IMPLANT
GLOVE BIOGEL PI IND STRL 7.0 (GLOVE) ×1 IMPLANT
GLOVE BIOGEL PI IND STRL 8 (GLOVE) ×1 IMPLANT
GLOVE BIOGEL PI INDICATOR 7.0 (GLOVE) ×1
GLOVE BIOGEL PI INDICATOR 8 (GLOVE) ×1
GLOVE ECLIPSE 7.0 STRL STRAW (GLOVE) ×2 IMPLANT
GLOVE SURG SYN 7.5  E (GLOVE) ×1
GLOVE SURG SYN 7.5 E (GLOVE) ×1 IMPLANT
GLOVE SURG SYN 7.5 PF PI (GLOVE) ×1 IMPLANT
GOWN STRL REIN XL XLG (GOWN DISPOSABLE) ×2 IMPLANT
GOWN STRL REUS W/ TWL LRG LVL3 (GOWN DISPOSABLE) ×1 IMPLANT
GOWN STRL REUS W/ TWL XL LVL3 (GOWN DISPOSABLE) ×1 IMPLANT
GOWN STRL REUS W/TWL LRG LVL3 (GOWN DISPOSABLE) ×1
GOWN STRL REUS W/TWL XL LVL3 (GOWN DISPOSABLE) ×1
KIT STR SPEAR 1.8 FBRTK DISP (KITS) ×1 IMPLANT
MANIFOLD NEPTUNE II (INSTRUMENTS) ×2 IMPLANT
NDL SCORPION MULTI FIRE (NEEDLE) IMPLANT
NDL SUT 6 .5 CRC .975X.05 MAYO (NEEDLE) IMPLANT
NEEDLE MAYO TAPER (NEEDLE)
NEEDLE SCORPION MULTI FIRE (NEEDLE) IMPLANT
PACK ARTHROSCOPY DSU (CUSTOM PROCEDURE TRAY) ×2 IMPLANT
PACK BASIN DAY SURGERY FS (CUSTOM PROCEDURE TRAY) ×2 IMPLANT
PAD COLD SHLDR WRAP-ON (PAD) ×2 IMPLANT
PORT APPOLLO RF 90DEGREE MULTI (SURGICAL WAND) ×2 IMPLANT
SHEET MEDIUM DRAPE 40X70 STRL (DRAPES) ×2 IMPLANT
SLEEVE SCD COMPRESS KNEE MED (STOCKING) ×2 IMPLANT
SLING ARM FOAM STRAP LRG (SOFTGOODS) IMPLANT
SLING ARM IMMOBILIZER LRG (SOFTGOODS) ×1 IMPLANT
SPIKE FLUID TRANSFER (MISCELLANEOUS) IMPLANT
STRIP CLOSURE SKIN 1/2X4 (GAUZE/BANDAGES/DRESSINGS) ×2 IMPLANT
SUCTION FRAZIER HANDLE 10FR (MISCELLANEOUS) ×1
SUCTION TUBE FRAZIER 10FR DISP (MISCELLANEOUS) IMPLANT
SUPPORT WRAP ARM LG (MISCELLANEOUS) ×1 IMPLANT
SUT ETHILON 3 0 PS 1 (SUTURE) ×2 IMPLANT
SUT FIBERWIRE #2 38 T-5 BLUE (SUTURE)
SUT MNCRL AB 3-0 PS2 18 (SUTURE) ×1 IMPLANT
SUT MNCRL AB 4-0 PS2 18 (SUTURE) ×2 IMPLANT
SUT PDS AB 1 CT  36 (SUTURE)
SUT PDS AB 1 CT 36 (SUTURE) IMPLANT
SUT TIGER TAPE 7 IN WHITE (SUTURE) IMPLANT
SUT VIC AB 0 CT1 27 (SUTURE) ×1
SUT VIC AB 0 CT1 27XCR 8 STRN (SUTURE) ×1 IMPLANT
SUT VIC AB 2-0 CT1 27 (SUTURE) ×3
SUT VIC AB 2-0 CT1 TAPERPNT 27 (SUTURE) ×1 IMPLANT
SUTURE FIBERWR #2 38 T-5 BLUE (SUTURE) IMPLANT
SUTURE TAPE 1.3 40 TPR END (SUTURE) IMPLANT
SUTURE TAPE TIGERLINK 1.3MM BL (SUTURE) IMPLANT
SUTURETAPE 1.3 40 TPR END (SUTURE)
SUTURETAPE TIGERLINK 1.3MM BL (SUTURE)
SYR TOOMEY 50ML (SYRINGE) IMPLANT
SYS FBRTK BUTTON 2.6 (Anchor) ×2 IMPLANT
SYSTEM FBRTK BUTTON 2.6 (Anchor) IMPLANT
TAPE FIBER 2MM 7IN #2 BLUE (SUTURE) IMPLANT
TOWEL GREEN STERILE FF (TOWEL DISPOSABLE) ×4 IMPLANT
TUBE CONNECTING 20X1/4 (TUBING) IMPLANT
TUBING ARTHROSCOPY IRRIG 16FT (MISCELLANEOUS) IMPLANT
WATER STERILE IRR 1000ML POUR (IV SOLUTION) ×2 IMPLANT

## 2021-09-30 NOTE — H&P (Signed)
? ? ?PREOPERATIVE H&P ? ?Chief Complaint: right proximal biceps rupture ? ?HPI: ?Darin Hart is a 51 y.o. male who presents for surgical treatment of right proximal biceps rupture.  He denies any changes in medical history. ? ?Past Medical History:  ?Diagnosis Date  ? Arthritis   ? knees  ? Asthma   ? Biceps rupture, proximal   ? right  ? Heart murmur   ? as child  ? ?Past Surgical History:  ?Procedure Laterality Date  ? HERNIA REPAIR    ? ORIF FINGER / THUMB FRACTURE Right   ? RHINOPLASTY    ? Spontaneous pneumothorax Left   ? 20 yrs ago  ? ?Social History  ? ?Socioeconomic History  ? Marital status: Married  ?  Spouse name: Not on file  ? Number of children: Not on file  ? Years of education: Not on file  ? Highest education level: Not on file  ?Occupational History  ? Not on file  ?Tobacco Use  ? Smoking status: Never  ? Smokeless tobacco: Never  ?Vaping Use  ? Vaping Use: Never used  ?Substance and Sexual Activity  ? Alcohol use: Yes  ?  Comment: OCCASIONALLY  ? Drug use: Never  ? Sexual activity: Yes  ?  Partners: Female  ?Other Topics Concern  ? Not on file  ?Social History Narrative  ? Not on file  ? ?Social Determinants of Health  ? ?Financial Resource Strain: Not on file  ?Food Insecurity: Not on file  ?Transportation Needs: Not on file  ?Physical Activity: Not on file  ?Stress: Not on file  ?Social Connections: Not on file  ? ?Family History  ?Problem Relation Age of Onset  ? Hypertension Mother   ? Other Neg Hx   ?     hypogonadism  ? ?Allergies  ?Allergen Reactions  ? Shellfish Allergy Anaphylaxis  ? Other Other (See Comments)  ?  Migraines, itchy tongue, burning mouth ?All Tree Nuts  ? Penicillins   ?  Pt was told he could not take this, his reaction is unknown ?Has patient had a PCN reaction causing immediate rash, facial/tongue/throat swelling, SOB or lightheadedness with hypotension: Unknown ?Has patient had a PCN reaction causing severe rash involving mucus membranes or skin necrosis:  Unknown ?Has patient had a PCN reaction that required hospitalization: NO ?Has patient had a PCN reaction occurring within the last 10 years: NO ?If all of the above answers are "NO", then may proceed with Cephalospo  ? ?Prior to Admission medications   ?Medication Sig Start Date End Date Taking? Authorizing Provider  ?Multiple Vitamin (MULTIVITAMIN WITH MINERALS) TABS tablet Take 1 tablet by mouth daily.   Yes [provider]  ?albuterol (VENTOLIN HFA) 108 (90 Base) MCG/ACT inhaler Inhale 2 puffs into the lungs every 6 (six) hours as needed for wheezing or shortness of breath. 10/02/20   Georgina Quint, MD  ? ? ? ?Positive ROS: All other systems have been reviewed and were otherwise negative with the exception of those mentioned in the HPI and as above. ? ?Physical Exam: ?General: Alert, no acute distress ?Cardiovascular: No pedal edema ?Respiratory: No cyanosis, no use of accessory musculature ?GI: abdomen soft ?Skin: No lesions in the area of chief complaint ?Neurologic: Sensation intact distally ?Psychiatric: Patient is competent for consent with normal mood and affect ?Lymphatic: no lymphedema ? ?MUSCULOSKELETAL: exam stable ? ?Assessment: ?right proximal biceps rupture ? ?Plan: ?Plan for Procedure(s): ?RIGHT BICEPS TENODESIS ? ?The risks benefits and alternatives were  discussed with the patient including but not limited to the risks of nonoperative treatment, versus surgical intervention including infection, bleeding, nerve injury,  blood clots, cardiopulmonary complications, morbidity, mortality, among others, and they were willing to proceed.  ? ?Preoperative templating of the joint replacement has been completed, documented, and submitted to the Operating Room personnel in order to optimize intra-operative equipment management. ? ? ?Glee Arvin, MD ?09/30/2021 ?11:39 AM ? ?

## 2021-09-30 NOTE — Anesthesia Procedure Notes (Signed)
Anesthesia Regional Block: Interscalene brachial plexus block  ? ?Pre-Anesthetic Checklist: , timeout performed,  Correct Patient, Correct Site, Correct Laterality,  Correct Procedure, Correct Position, site marked,  Risks and benefits discussed,  Surgical consent,  Pre-op evaluation,  At surgeon's request and post-op pain management ? ?Laterality: Right ? ?Prep: chloraprep     ?  ?Needles:  ?Injection technique: Single-shot ? ?Needle Type: Echogenic Stimulator Needle   ? ? ?Needle Length: 9cm  ?Needle Gauge: 21  ? ? ? ?Additional Needles: ? ? ?Procedures:,,,, ultrasound used (permanent image in chart),,    ?Narrative:  ?Start time: 09/30/2021 1:30 PM ?End time: 09/30/2021 1:40 PM ?Injection made incrementally with aspirations every 5 mL. ? ?Performed by: Personally  ?Anesthesiologist: Leonides Grills, MD ? ?Additional Notes: ?Functioning IV was confirmed and monitors were applied.  A timeout was performed. Sterile prep, hand hygiene and sterile gloves were used. A 75mm 21ga Arrow echogenic stimulator needle was used. Negative aspiration and negative test dose prior to incremental administration of local anesthetic. The patient tolerated the procedure well. ? ?Ultrasound guidance: relevent anatomy identified, needle position confirmed, local anesthetic spread visualized around nerve(s), vascular puncture avoided.  Image printed for medical record.  ? ? ? ? ? ?

## 2021-09-30 NOTE — Anesthesia Procedure Notes (Signed)
Procedure Name: Intubation ?Date/Time: 09/30/2021 2:03 PM ?Performed by: Maryella Shivers, CRNA ?Pre-anesthesia Checklist: Patient identified, Emergency Drugs available, Suction available and Patient being monitored ?Patient Re-evaluated:Patient Re-evaluated prior to induction ?Oxygen Delivery Method: Circle system utilized ?Preoxygenation: Pre-oxygenation with 100% oxygen ?Induction Type: IV induction ?Ventilation: Mask ventilation without difficulty ?Laryngoscope Size: Mac and 4 ?Grade View: Grade I ?Tube type: Oral ?Tube size: 8.0 mm ?Number of attempts: 1 ?Airway Equipment and Method: Stylet and Oral airway ?Placement Confirmation: ETT inserted through vocal cords under direct vision, positive ETCO2 and breath sounds checked- equal and bilateral ?Secured at: 22 cm ?Tube secured with: Tape ?Dental Injury: Teeth and Oropharynx as per pre-operative assessment  ? ? ? ? ?

## 2021-09-30 NOTE — Anesthesia Preprocedure Evaluation (Addendum)
Anesthesia Evaluation  ?Patient identified by MRN, date of birth, ID band ?Patient awake ? ? ? ?Reviewed: ?Allergy & Precautions, NPO status , Patient's Chart, lab work & pertinent test results ? ?Airway ?Mallampati: II ? ?TM Distance: >3 FB ?Neck ROM: Full ? ? ? Dental ?no notable dental hx. ? ?  ?Pulmonary ?asthma ,  ?  ?Pulmonary exam normal ? ? ? ? ? ? ? Cardiovascular ?negative cardio ROS ?Normal cardiovascular exam ? ? ?  ?Neuro/Psych ?negative neurological ROS ? negative psych ROS  ? GI/Hepatic ?negative GI ROS, Neg liver ROS,   ?Endo/Other  ?negative endocrine ROS ? Renal/GU ?negative Renal ROS  ? ?  ?Musculoskeletal ? ?(+) Arthritis ,  ? Abdominal ?  ?Peds ? Hematology ?negative hematology ROS ?(+)   ?Anesthesia Other Findings ?right proximal biceps rupture ? Reproductive/Obstetrics ? ?  ? ? ? ? ? ? ? ? ? ? ? ? ? ?  ?  ? ? ? ? ? ? ? ?Anesthesia Physical ?Anesthesia Plan ? ?ASA: 2 ? ?Anesthesia Plan: General and Regional  ? ?Post-op Pain Management: Regional block*  ? ?Induction: Intravenous ? ?PONV Risk Score and Plan: 2 and Ondansetron, Dexamethasone, Midazolam and Treatment may vary due to age or medical condition ? ?Airway Management Planned: Oral ETT ? ?Additional Equipment:  ? ?Intra-op Plan:  ? ?Post-operative Plan: Extubation in OR ? ?Informed Consent: I have reviewed the patients History and Physical, chart, labs and discussed the procedure including the risks, benefits and alternatives for the proposed anesthesia with the patient or authorized representative who has indicated his/her understanding and acceptance.  ? ? ? ?Dental advisory given ? ?Plan Discussed with: CRNA ? ?Anesthesia Plan Comments:   ? ? ? ? ? ?Anesthesia Quick Evaluation ? ?

## 2021-09-30 NOTE — Progress Notes (Signed)
Assisted Dr. Ellender with right, interscalene , ultrasound guided block. Side rails up, monitors on throughout procedure. See vital signs in flow sheet. Tolerated Procedure well. ?

## 2021-09-30 NOTE — Discharge Instructions (Addendum)
? ? ?Post-operative patient instructions  ?Shoulder surgery ? ?Ice:  Place intermittent ice or cooler pack over your shoulder, 30 minutes on and 30 minutes off.  Continue this for the first 72 hours after surgery, then save ice for use after therapy sessions or on more active days.   ?Weight:  You may only lift up to 2 lbs with your hand.   ?Motion:  Perform gentle shoulder motion as tolerated ?Dressing:  Perform 1st dressing change at 2 days postoperative. A moderate amount of blood tinged drainage is to be expected.  So if you bleed through the dressing on the first or second day or if you have fevers, it is fine to change the dressing/check the wounds early and redress wound.  If it bleeds through again, or if the incisions are leaking frank blood, please call the office. May change dressing every 1-2 days thereafter to help watch wounds. Can purchase Tegaderm (or 68M Nexcare) water resistant dressings at local pharmacy / [employer]. ?Shower:  Light shower is ok after 2 days.  Please take shower, NO bath. Recover with gauze and ace wrap to help keep wounds protected.   ?Pain medication:  A narcotic pain medication has been prescribed.  Take as directed.  Typically you need narcotic pain medication more regularly during the first 3 to 5 days after surgery.  Decrease your use of the medication as the pain improves.  Narcotics can sometimes cause constipation, even after a few doses.  If you have problems with constipation, you can take an over the counter stool softener or light laxative.  If you have persistent problems, please notify your physician?s office. ?Physical therapy: Additional activity guidelines to be provided by your physician or physical therapist at follow-up visits.  ?Driving: Do not recommend driving x 2 weeks post surgical, especially if surgery performed on right side. Should not drive while taking narcotic pain medications. It typically takes at least 2 weeks to restore sufficient neuromuscular  function for normal reaction times for driving safety.  ?Call [phone] for questions or problems. Evenings you will be forwarded to the hospital operator.  Ask for the orthopaedic physician on call. Please call if you experience:  ?  ?Redness, foul smelling, or persistent drainage from the surgical site  ?worsening shoulder pain and swelling not responsive to medication  ?any calf pain and or swelling of the lower leg  ?temperatures greater than 101.5 F ?other questions or concerns ? ? ?Thank you for allowing [place] to be a part of your care. ? ? ? ? ?Post Anesthesia Home Care Instructions ? ?Activity: ?Get plenty of rest for the remainder of the day. A responsible individual must stay with you for 24 hours following the procedure.  ?For the next 24 hours, DO NOT: ?-Drive a car ?-Advertising copywriter ?-Drink alcoholic beverages ?-Take any medication unless instructed by your physician ?-Make any legal decisions or sign important papers. ? ?Meals: ?Start with liquid foods such as gelatin or soup. Progress to regular foods as tolerated. Avoid greasy, spicy, heavy foods. If nausea and/or vomiting occur, drink only clear liquids until the nausea and/or vomiting subsides. Call your physician if vomiting continues. ? ?Special Instructions/Symptoms: ?Your throat may feel dry or sore from the anesthesia or the breathing tube placed in your throat during surgery. If this causes discomfort, gargle with warm salt water. The discomfort should disappear within 24 hours. ? ?If you had a scopolamine patch placed behind your ear for the management of post- operative nausea and/or  vomiting: ? ?1. The medication in the patch is effective for 72 hours, after which it should be removed.  Wrap patch in a tissue and discard in the trash. Wash hands thoroughly with soap and water. ?2. You may remove the patch earlier than 72 hours if you experience unpleasant side effects which may include dry mouth, dizziness or visual disturbances. ?3.  Avoid touching the patch. Wash your hands with soap and water after contact with the patch. ?   ? ?

## 2021-09-30 NOTE — Transfer of Care (Signed)
Immediate Anesthesia Transfer of Care Note ? ?Patient: KADE DEMICCO ? ?Procedure(s) Performed: RIGHT BICEPS TENODESIS (Right: Shoulder) ? ?Patient Location: PACU ? ?Anesthesia Type:GA combined with regional for post-op pain ? ?Level of Consciousness: sedated ? ?Airway & Oxygen Therapy: Patient Spontanous Breathing and Patient connected to face mask oxygen ? ?Post-op Assessment: Report given to RN and Post -op Vital signs reviewed and stable ? ?Post vital signs: Reviewed and stable ? ?Last Vitals:  ?Vitals Value Taken Time  ?BP 109/66 09/30/21 1602  ?Temp    ?Pulse 70 09/30/21 1603  ?Resp 19 09/30/21 1603  ?SpO2 100 % 09/30/21 1603  ?Vitals shown include unvalidated device data. ? ?Last Pain:  ?Vitals:  ? 09/30/21 1130  ?TempSrc: Oral  ?PainSc: 0-No pain  ?   ? ?Patients Stated Pain Goal: 5 (09/30/21 1130) ? ?Complications: No notable events documented. ?

## 2021-09-30 NOTE — Op Note (Signed)
? ?Date of Surgery: 09/30/2021 ? ?INDICATIONS: Darin Hart is a 51 y.o.-year-old male with a right proximal biceps rupture.  The patient did consent to the procedure after discussion of the risks and benefits. ? ?PREOPERATIVE DIAGNOSIS: Right proximal biceps rupture ? ?POSTOPERATIVE DIAGNOSIS: Same. ? ?PROCEDURE:  ?Tenodesis of right proximal biceps tendon ?Tenolysis of right long head biceps tendon ? ?SURGEON: N. Glee Arvin, M.D. ? ?ASSIST: Oneal Grout, PA-C; necessary for the timely completion of procedure and due to complexity of procedure. ? ?ANESTHESIA:  general ? ?IV FLUIDS AND URINE: See anesthesia. ? ?ESTIMATED BLOOD LOSS: 50 mL. ? ?IMPLANTS:  ?Implant Name Type Inv. Item Serial No. Manufacturer Lot No. LRB No. Used Action  ?SYS FBRTK BUTTON 2.6 - [id number] Anchor SYS FBRTK BUTTON 2.6  [employer] [id number] Right 1 Implanted  ?ANCHOR SUT 1.8 FIBERTAK SB KL - [id number] Anchor ANCHOR SUT 1.8 FIBERTAK SB KL  [employer] [id number] Right 1 Implanted  ?ANCHOR SUT BIO SW 4.75X19.1 - [id number] Anchor ANCHOR SUT BIO SW 4.75X19.1  Marcie Bal [id number] Right 1 Implanted  ? ? ?DRAINS: none ? ?COMPLICATIONS: see description of procedure. ? ?DESCRIPTION OF PROCEDURE: The patient was brought to the operating room.  The patient had been signed prior to the procedure and this was documented. The patient had the anesthesia placed by the anesthesiologist.  A time-out was performed to confirm that this was the correct patient, site, side and location. The patient did receive antibiotics prior to the incision and was re-dosed during the procedure as needed at indicated intervals.  The patient had the operative extremity prepped and draped in the standard surgical fashion.    ?A standard deltopectoral incision and approach was utilized.  The cephalic vein was identified and swept laterally.  The clavipectoral fascia was carefully incised and the coracoid was palpated along with the conjoined tendon.  The arm was placed into  abduction and retractors were placed in the subacromial space to deliver the proximal humerus.  The lesser tuberosity was palpated.  The transverse humeral ligament was incised from the lateral side to expose the bicipital groove.  There was a small wispy remnant of the long head of the biceps remaining that was still attached.  Tenotomy was performed.  I traced the remnant of the tendon down to the pectoralis major and latissimus insertion and I could tell that the main tendon had retracted distal to that.  I could palpate the tendon in the subcutaneous tissue.  I extended the incision distally down the arm.  Careful dissection was carried out until I found the tendon.  Tenolysis had to be performed distally and proximally in order to free the tendon up from surrounding adhesions.  Once I freed up the tendon the end was prepared for tenodesis.  There was not enough excursion of the tendon to allow tenodesis back to the bicipital groove therefore I pulled as much tension and length proximally in order to restore as much of the biceps contour as I could.  I found that I could tenodesis the tendon and the humeral shaft just lateral to the insertion of the pectoralis insertion without disrupting it.  The end of the tendon was whipstitched and 2 fiber tack knotless anchors were placed distally.  I decided to use a third anchor more proximally for extra fixation.  I used a 4.75 mm swivel lock anchor.  The patient had excellent bone quality.  The surgical site was then thoroughly irrigated and layered closure was performed.  Sterile dressings applied.  Shoulder sling and immobilizer placed.  Patient tolerated the procedure well had no many complications. ? ?Darin Hart was necessary for opening, closing, retracting, limb positioning and overall facilitation and timely completion of the procedure. ? ?POSTOPERATIVE PLAN: Patient will be in a sling for 6 weeks.  He is limited to 2 pounds lifting through elbow. ? ?N.  Glee Arvin, MD ?3:22 PM ? ? ? ? ? ?

## 2021-10-01 ENCOUNTER — Encounter (HOSPITAL_BASED_OUTPATIENT_CLINIC_OR_DEPARTMENT_OTHER): Payer: Self-pay | Admitting: Orthopaedic Surgery

## 2021-10-01 ENCOUNTER — Telehealth: Payer: Self-pay | Admitting: Orthopaedic Surgery

## 2021-10-01 NOTE — Telephone Encounter (Signed)
Up to 72 hrs sometimes

## 2021-10-01 NOTE — Telephone Encounter (Signed)
Pt is wanting to know how long the numbness will last  ?-Please call  ? ? ?**advised pt Disability forms are $25.00 cash, check  money order  ?

## 2021-10-01 NOTE — Anesthesia Postprocedure Evaluation (Signed)
Anesthesia Post Note ? ?Patient: Darin Hart ? ?Procedure(s) Performed: RIGHT BICEPS TENODESIS (Right: Shoulder) ? ?  ? ?Patient location during evaluation: PACU ?Anesthesia Type: Regional and General ?Level of consciousness: awake ?Pain management: pain level controlled ?Vital Signs Assessment: post-procedure vital signs reviewed and stable ?Respiratory status: spontaneous breathing, nonlabored ventilation, respiratory function stable and patient connected to nasal cannula oxygen ?Cardiovascular status: blood pressure returned to baseline and stable ?Postop Assessment: no apparent nausea or vomiting ?Anesthetic complications: no ? ? ?No notable events documented. ? ?Last Vitals:  ?Vitals:  ? 09/30/21 1630 09/30/21 1650  ?BP: 113/75 111/76  ?Pulse: 67 72  ?Resp: 18 18  ?Temp:  36.6 ?C  ?SpO2: 99% 98%  ?  ?Last Pain:  ?Vitals:  ? 09/30/21 1650  ?TempSrc:   ?PainSc: 0-No pain  ? ? ?  ?  ?  ?  ?  ?  ? ?Azana Kiesler P Felipe Paluch ? ? ? ? ?

## 2021-10-02 NOTE — Telephone Encounter (Signed)
Sent patient mychart msg.

## 2021-10-07 ENCOUNTER — Ambulatory Visit (INDEPENDENT_AMBULATORY_CARE_PROVIDER_SITE_OTHER): Payer: 59 | Admitting: Physician Assistant

## 2021-10-07 DIAGNOSIS — M67813 Other specified disorders of tendon, right shoulder: Secondary | ICD-10-CM

## 2021-10-07 NOTE — Progress Notes (Signed)
? ?  Post-Op Visit Note ?  ?Patient: Darin Hart           ?Date of Birth: Dec 19, 1970           ?MRN: [id number] ?Visit Date: 10/07/2021 ?PCP: Georgina Quint, MD ? ? ?Assessment & Plan: ? ?Chief Complaint:  ?Chief Complaint  ?Patient presents with  ? Right Shoulder - Routine Post Op  ? ?Visit Diagnoses:  ?1. Biceps tendonosis of right shoulder   ? ? ?Plan: Patient is a pleasant 51 year old gentleman who comes in today 1 week status post right shoulder biceps tenodesis 09/30/2021.  He has been doing well.  He notes slight discomfort.  He has been taking oxycodone up until 1 to 2 days ago.  He has been compliant wearing his sling.  Examination of the right shoulder reveals a well-healed surgical incision.  No evidence of infection or cellulitis.  Fingers warm well perfused.  He is neurovascular intact distally.  Today, we discussed that he may discontinue the sling.  He may start gentle range of motion of the shoulder with physical therapy but will not allow greater than 2 pounds of lifting through the elbow.  He will follow-up with [place] in 5 weeks time for recheck.  Call with concerns or questions. ? ?Follow-Up Instructions: Return in about 5 weeks (around 11/11/2021).  ? ?Orders:  ?Orders Placed This Encounter  ?Procedures  ? Ambulatory referral to Physical Therapy  ? ?No orders of the defined types were placed in this encounter. ? ? ?Imaging: ?No new imaging ? ?PMFS History: ?Patient Active Problem List  ? Diagnosis Date Noted  ? Traumatic rupture of right biceps tendon 09/30/2021  ? Low back pain 01/23/2021  ? Chronic musculoskeletal pain 10/02/2020  ? History of asthma 10/02/2020  ? History of multiple allergies 10/02/2020  ? Low testosterone 02/07/2018  ? Screening for prostate cancer 11/28/2017  ? ?Past Medical History:  ?Diagnosis Date  ? Arthritis   ? knees  ? Asthma   ? Biceps rupture, proximal   ? right  ? Heart murmur   ? as child  ?  ?Family History  ?Problem Relation Age of Onset  ? Hypertension  Mother   ? Other Neg Hx   ?     hypogonadism  ?  ?Past Surgical History:  ?Procedure Laterality Date  ? BICEPT TENODESIS Right 09/30/2021  ? Procedure: RIGHT BICEPS TENODESIS;  Surgeon: Tarry Kos, MD;  Location: [facility];  Service: Orthopedics;  Laterality: Right;  ? HERNIA REPAIR    ? ORIF FINGER / THUMB FRACTURE Right   ? RHINOPLASTY    ? Spontaneous pneumothorax Left   ? 20 yrs ago  ? ?Social History  ? ?Occupational History  ? Not on file  ?Tobacco Use  ? Smoking status: Never  ? Smokeless tobacco: Never  ?Vaping Use  ? Vaping Use: Never used  ?Substance and Sexual Activity  ? Alcohol use: Yes  ?  Comment: OCCASIONALLY  ? Drug use: Never  ? Sexual activity: Yes  ?  Partners: Female  ? ? ? ?

## 2021-10-21 NOTE — Therapy (Signed)
OUTPATIENT PHYSICAL THERAPY SHOULDER EVALUATION   Patient Name: Darin Hart MRN: [id number] DOB:April 08, 1971, 51 y.o., male Today's Date: 10/21/2021    Past Medical History:  Diagnosis Date   Arthritis    knees   Asthma    Biceps rupture, proximal    right   Heart murmur    as child   Past Surgical History:  Procedure Laterality Date   BICEPT TENODESIS Right 09/30/2021   Procedure: RIGHT BICEPS TENODESIS;  Surgeon: Tarry Kos, MD;  Location: [facility];  Service: Orthopedics;  Laterality: Right;   HERNIA REPAIR     ORIF FINGER / THUMB FRACTURE Right    RHINOPLASTY     Spontaneous pneumothorax Left    20 yrs ago   Patient Active Problem List   Diagnosis Date Noted   Traumatic rupture of right biceps tendon 09/30/2021   Low back pain 01/23/2021   Chronic musculoskeletal pain 10/02/2020   History of asthma 10/02/2020   History of multiple allergies 10/02/2020   Low testosterone 02/07/2018   Screening for prostate cancer 11/28/2017    PCP: Georgina Quint.  REFERRING PROVIDER: Ivin Poot  REFERRING DIAG: [phone] (ICD-10-CM) - Biceps tendonosis of right shoulder  THERAPY DIAG:  No diagnosis found.  Rationale for Evaluation and Treatment Rehabilitation  ONSET DATE: 09/30/2021 Surgery Rt biceps Tenodesis  SUBJECTIVE:                                                                                                                                                                                      SUBJECTIVE STATEMENT: ***  PERTINENT HISTORY: ***  PAIN:  Are you having pain? Yes: NPRS scale: ***/10 Pain location: *** Pain description: *** Aggravating factors: *** Relieving factors: ***  PRECAUTIONS: Shoulder -No lifting greater than 2 lbs through the elbow  WEIGHT BEARING RESTRICTIONS Yes Shoulder Rt  FALLS:  Has patient fallen in last 6 months? No  LIVING ENVIRONMENT: Lives with: {OPRC lives with:25569::"lives  with their family"} Lives in: {Lives in:25570} Stairs: {opstairs:27293} Has following equipment at home: {Assistive devices:23999}  OCCUPATION: ***  PLOF: Independent  PATIENT GOALS Reduce pain  OBJECTIVE:   PATIENT SURVEYS:  FOTO intake:   predicted:   COGNITION:  Overall cognitive status: Within functional limits for tasks assessed     SENSATION: WFL  POSTURE: ***  UPPER EXTREMITY ROM:   {AROM/PROM:27142} ROM Right eval Left eval  Shoulder flexion    Shoulder extension    Shoulder abduction    Shoulder adduction    Shoulder internal rotation    Shoulder external rotation    Elbow flexion    Elbow extension  Wrist flexion    Wrist extension    Wrist ulnar deviation    Wrist radial deviation    Wrist pronation    Wrist supination    (Blank rows = not tested)  UPPER EXTREMITY MMT:  MMT Right eval Left eval  Shoulder flexion    Shoulder extension    Shoulder abduction    Shoulder adduction    Shoulder internal rotation    Shoulder external rotation    Middle trapezius    Lower trapezius    Elbow flexion    Elbow extension    Wrist flexion    Wrist extension    Wrist ulnar deviation    Wrist radial deviation    Wrist pronation    Wrist supination    Grip strength (lbs)    (Blank rows = not tested)  SHOULDER SPECIAL TESTS:  Impingement tests: {shoulder impingement test:25231:a}  SLAP lesions: {SLAP lesions:25232}  Instability tests: {shoulder instability test:25233}  Rotator cuff assessment: {rotator cuff assessment:25234}  Biceps assessment: {biceps assessment:25235}  JOINT MOBILITY TESTING:  ***  PALPATION:  ***   TODAY'S TREATMENT:  Therex:  HEP instruction/performance c cues for techniques, handout provided.  Trial set performed of each for comprehension and symptom assessment.  See below for exercise list.   PATIENT EDUCATION: Education details: HEP, POC Person educated: Patient Education method: Explanation,  Demonstration, Verbal cues, and Handouts Education comprehension: verbalized understanding, returned demonstration, and verbal cues required   HOME EXERCISE PROGRAM: ***  ASSESSMENT:  CLINICAL IMPRESSION: Patient is a *** y.o. who comes to clinic with complaints of *** pain with mobility, strength and movement coordination deficits that impair their ability to perform usual daily and recreational functional activities without increase difficulty/symptoms at this time.  Patient to benefit from skilled PT services to address impairments and limitations to improve to previous level of function without restriction secondary to condition.    OBJECTIVE IMPAIRMENTS decreased activity tolerance, decreased coordination, decreased endurance, decreased mobility, decreased ROM, decreased strength, hypomobility, increased edema, increased fascial restrictions, impaired perceived functional ability, impaired flexibility, impaired UE functional use, improper body mechanics, and pain.   ACTIVITY LIMITATIONS carrying, lifting, sleeping, dressing, and reach over head  PARTICIPATION LIMITATIONS: {participationrestrictions:25113}  PERSONAL FACTORS {Personal factors:25162} are also affecting patient's functional outcome.   REHAB POTENTIAL: Good  CLINICAL DECISION MAKING: Stable/uncomplicated  EVALUATION COMPLEXITY: Low   GOALS: Goals reviewed with patient? Yes  Short term PT Goals (target date for Short term goals are 3 weeks ***) Patient will demonstrate independent use of home exercise program to maintain progress from in clinic treatments. Goal status: New   Long term PT goals (target dates for all long term goals are 10 weeks  *** )  1. Patient will demonstrate/report pain at worst less than or equal to 2/10 to facilitate minimal limitation in daily activity secondary to pain symptoms. Goal status: New  2. Patient will demonstrate independent use of home exercise program to facilitate ability  to maintain/progress functional gains from skilled physical therapy services. Goal status: New  3. Patient will demonstrate FOTO outcome > or = *** % to indicate reduced disability due to condition. Goal status: New  4.  Patient will demonstrate *** GH joint mobility WFL to facilitate usual self care, dressing, reaching overhead at PLOF s limitation due to symptoms.  Goal status: New  5.  Patient will demonstrate *** UE MMT 5/5 throughout to facilitate usual lifting, carrying in functional activity to PLOF s limitation.   Goal status: New  6.  ***  Goal status: New  7.  *** a.  Goal Status: New   PLAN: PT FREQUENCY: {rehab frequency:25116}  PT DURATION: {rehab duration:25117}  PLANNED INTERVENTIONS: Therapeutic exercises, Therapeutic activity, Neuro Muscular re-education, Balance training, Gait training, Patient/Family education, Joint mobilization, Stair training, DME instructions, Dry Needling, Electrical stimulation, Cryotherapy, Moist heat, Taping, Ultrasound, Ionotophoresis 4mg /ml Dexamethasone, and Manual therapy.  All included unless contraindicated  PLAN FOR NEXT SESSION: ***   , PT, DPT, OCS, ATC 10/21/21  11:05 AM

## 2021-10-23 ENCOUNTER — Encounter: Payer: Self-pay | Admitting: Rehabilitative and Restorative Service Providers"

## 2021-10-23 ENCOUNTER — Ambulatory Visit (INDEPENDENT_AMBULATORY_CARE_PROVIDER_SITE_OTHER): Payer: 59 | Admitting: Rehabilitative and Restorative Service Providers"

## 2021-10-23 ENCOUNTER — Other Ambulatory Visit: Payer: Self-pay

## 2021-10-23 DIAGNOSIS — G8929 Other chronic pain: Secondary | ICD-10-CM

## 2021-10-23 DIAGNOSIS — M6281 Muscle weakness (generalized): Secondary | ICD-10-CM

## 2021-10-23 DIAGNOSIS — M25511 Pain in right shoulder: Secondary | ICD-10-CM

## 2021-10-23 DIAGNOSIS — M25611 Stiffness of right shoulder, not elsewhere classified: Secondary | ICD-10-CM

## 2021-10-23 DIAGNOSIS — R600 Localized edema: Secondary | ICD-10-CM | POA: Diagnosis not present

## 2021-10-27 ENCOUNTER — Ambulatory Visit (INDEPENDENT_AMBULATORY_CARE_PROVIDER_SITE_OTHER): Payer: 59 | Admitting: Orthopaedic Surgery

## 2021-10-27 ENCOUNTER — Ambulatory Visit (INDEPENDENT_AMBULATORY_CARE_PROVIDER_SITE_OTHER): Payer: 59

## 2021-10-27 DIAGNOSIS — M20022 Boutonniere deformity of left finger(s): Secondary | ICD-10-CM

## 2021-10-27 MED ORDER — HYDROCODONE-ACETAMINOPHEN 5-325 MG PO TABS: 1.0000 | ORAL_TABLET | Freq: Three times a day (TID) | ORAL | 0 refills | Status: DC | PRN

## 2021-10-27 NOTE — Progress Notes (Signed)
Office Visit Note   Patient: Darin Hart           Date of Birth: [birth year]           MRN: [id number] Visit Date: 10/27/2021              Requested by: Georgina Quint, MD [address] [place],  [place] [address] PCP: Georgina Quint, MD   Assessment & Plan: Visit Diagnoses:  1. 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  At this point.  Would like to place the patient in AlumaFoam splint and make an urgent referral to Dr. Frazier Butt for further evaluation treatment recommendation.  He will follow-up with [place] as regularly scheduled appointment for his right shoulder.  Call with concerns or questions in the meantime.  Follow-Up Instructions: Return for urgent f/u with Dr. Frazier Butt.   Orders:  Orders Placed This Encounter  Procedures   XR Finger Little Left   Ambulatory referral to Orthopedic Surgery   Meds ordered this encounter  Medications   HYDROcodone-acetaminophen (NORCO) 5-325 MG tablet    Sig: Take 1 tablet by mouth 3 (three) times daily as needed.    Dispense:  20 tablet    Refill:  0      Procedures: No procedures performed   Clinical Data: No additional findings.   Subjective: Chief Complaint  Patient presents with   Left Hand - Pain    HPI patient is a pleasant right-hand-dominant gentleman who comes in today with an injury to the left small finger.  About a month ago, he was playing with his dog when he jammed his left small finger.  He has had pain and swelling primarily to the PIP joint with noticeable deformity.  Symptoms occur when he is hitting this against an object.  He denies any symptoms at rest.  He is status post right shoulder biceps tenodesis and has been taking medication for this which he thinks has been helping for his finger pain.  Review of Systems as detailed in HPI.  All others reviewed and are  negative.   Objective: Vital Signs: There were no vitals taken for this visit.  Physical Exam well-nourished gentleman in no acute distress but alert and oriented x3.  Ortho Exam left small finger exam reveals a noticeable boutonniere deformity with flexion of the PIP joint extension of the DIP joint.  I am slightly able to extend the PIP joint.  He has moderate swelling to the PIP joint.  He is neurovascular intact distally.  Specialty Comments:  No specialty comments available.  Imaging: XR Finger Little Left  Result Date: 10/27/2021 Flexion at the pip joint and extension at the dip joint    PMFS History: Patient Active Problem List   Diagnosis Date Noted   Traumatic rupture of right biceps tendon 09/30/2021   Low back pain 01/23/2021   Chronic musculoskeletal pain 10/02/2020   History of asthma 10/02/2020   History of multiple allergies 10/02/2020   Low testosterone 02/07/2018   Screening for prostate cancer 11/28/2017   Past Medical History:  Diagnosis Date   Arthritis    knees   Asthma    Biceps rupture, proximal    right   Heart murmur    as child    Family History  Problem Relation Age of Onset   Hypertension Mother    Other Neg Hx  hypogonadism    Past Surgical History:  Procedure Laterality Date   BICEPT TENODESIS Right 09/30/2021   Procedure: RIGHT BICEPS TENODESIS;  Surgeon: Tarry Kos, MD;  Location: [facility];  Service: Orthopedics;  Laterality: Right;   HERNIA REPAIR     ORIF FINGER / THUMB FRACTURE Right    RHINOPLASTY     Spontaneous pneumothorax Left    20 yrs ago   Social History   Occupational History   Not on file  Tobacco Use   Smoking status: Never   Smokeless tobacco: Never  Vaping Use   Vaping Use: Never used  Substance and Sexual Activity   Alcohol use: Yes    Comment: OCCASIONALLY   Drug use: Never   Sexual activity: Yes    Partners: Female

## 2021-10-29 ENCOUNTER — Encounter: Payer: Self-pay | Admitting: Orthopedic Surgery

## 2021-10-29 ENCOUNTER — Ambulatory Visit (INDEPENDENT_AMBULATORY_CARE_PROVIDER_SITE_OTHER): Payer: 59 | Admitting: Orthopedic Surgery

## 2021-10-29 VITALS — BP 113/72 | HR 69 | Ht 72.0 in | Wt 178.5 lb

## 2021-10-29 DIAGNOSIS — M20022 Boutonniere deformity of left finger(s): Secondary | ICD-10-CM

## 2021-10-29 NOTE — Progress Notes (Signed)
Office Visit Note   Patient: Darin Hart           Date of Birth: Aug 23, 1970           MRN: [id number] Visit Date: 10/29/2021              Requested by: Leandrew Koyanagi, MD [address] [place],  [place] [address] PCP: Horald Pollen, MD   Assessment & Plan: Visit Diagnoses:  1. 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  He has a 50 degree extensor lag with an approximately 20 degree flexion contracture at the PIP joint.  Fortunately his DIP joint remains supple.  I would like to refer him to therapy to work on passive extension of the PIP joint.  Our therapist stopped by and discussed strategies such as serial splinting to help achieve this goal.  I can see him back in a month or so after some time in therapy.  Follow-Up Instructions: No follow-ups on file.   Orders:  No orders of the defined types were placed in this encounter.  No orders of the defined types were placed in this encounter.     Procedures: No procedures performed   Clinical Data: No additional findings.   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  Patient was playing with his puppy about a month or so ago when he jammed his left small finger.  This pain has improved but remains flexed at the PIP joint and remains swollen.  He has pain in the finger if he bumps it on anything.  He was seen by 1 my partners and was placed in an AlumaFoam splint.  Injury    Review of Systems   Objective: Vital Signs: BP 113/72 (BP Location: Left  Arm, Patient Position: Sitting)   Pulse 69   Ht 6' (1.829 m)   Wt 178 lb 8 oz (81 kg)   BMI 24.21 kg/m   Physical Exam Constitutional:      Appearance: Normal appearance.  Cardiovascular:     Rate and Rhythm: Normal rate.     Pulses: Normal pulses.  Pulmonary:     Effort: Pulmonary effort is normal.  Skin:    General: Skin is warm and dry.     Capillary Refill: Capillary refill takes less than 2 seconds.  Neurological:     Mental Status: He is alert.     Left Hand Exam   Tenderness  Left hand tenderness location: TTP at PIP joint w/ moderate joint swelling.   Other  Erythema: absent Sensation: normal Pulse: present  Comments:  Approx 50 deg extensor lag at PIP joint w/ 20 deg fixed flexion contracture.  Supple DIP joint w/ active flexion and extension.       Specialty Comments:  No specialty comments available.  Imaging: No results found.   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   Traumatic rupture of right biceps tendon 09/30/2021  Low back pain 01/23/2021   Chronic musculoskeletal pain 10/02/2020   History of asthma 10/02/2020   History of multiple allergies 10/02/2020   Low testosterone 02/07/2018   Screening for prostate cancer 11/28/2017   Past Medical History:  Diagnosis Date   Arthritis    knees   Asthma    Biceps rupture, proximal    right   Heart murmur    as child    Family History  Problem Relation Age of Onset   Hypertension Mother    Other Neg Hx        hypogonadism    Past Surgical History:  Procedure Laterality Date   BICEPT TENODESIS Right 09/30/2021   Procedure: RIGHT BICEPS TENODESIS;  Surgeon: Leandrew Koyanagi, MD;  Location: [facility];  Service: Orthopedics;  Laterality: Right;   HERNIA REPAIR     ORIF FINGER / THUMB FRACTURE Right    RHINOPLASTY     Spontaneous pneumothorax Left    20 yrs ago   Social History    Occupational History   Not on file  Tobacco Use   Smoking status: Never   Smokeless tobacco: Never  Vaping Use   Vaping Use: Never used  Substance and Sexual Activity   Alcohol use: Yes    Comment: OCCASIONALLY   Drug use: Never   Sexual activity: Yes    Partners: Female

## 2021-10-30 ENCOUNTER — Ambulatory Visit: Payer: 59 | Admitting: Orthopaedic Surgery

## 2021-11-02 ENCOUNTER — Encounter: Payer: Self-pay | Admitting: Rehabilitative and Restorative Service Providers"

## 2021-11-02 ENCOUNTER — Ambulatory Visit (INDEPENDENT_AMBULATORY_CARE_PROVIDER_SITE_OTHER): Payer: 59 | Admitting: Rehabilitative and Restorative Service Providers"

## 2021-11-02 ENCOUNTER — Other Ambulatory Visit: Payer: Self-pay

## 2021-11-02 DIAGNOSIS — R600 Localized edema: Secondary | ICD-10-CM | POA: Diagnosis not present

## 2021-11-02 DIAGNOSIS — R278 Other lack of coordination: Secondary | ICD-10-CM

## 2021-11-02 DIAGNOSIS — M6281 Muscle weakness (generalized): Secondary | ICD-10-CM | POA: Diagnosis not present

## 2021-11-02 DIAGNOSIS — M25511 Pain in right shoulder: Secondary | ICD-10-CM | POA: Diagnosis not present

## 2021-11-02 DIAGNOSIS — M25542 Pain in joints of left hand: Secondary | ICD-10-CM | POA: Diagnosis not present

## 2021-11-02 DIAGNOSIS — M25611 Stiffness of right shoulder, not elsewhere classified: Secondary | ICD-10-CM | POA: Diagnosis not present

## 2021-11-02 DIAGNOSIS — M25642 Stiffness of left hand, not elsewhere classified: Secondary | ICD-10-CM

## 2021-11-02 DIAGNOSIS — G8929 Other chronic pain: Secondary | ICD-10-CM

## 2021-11-02 NOTE — Therapy (Signed)
OUTPATIENT OCCUPATIONAL THERAPY ORTHO EVALUATION  Patient Name: Darin Hart MRN: [id number] DOB:1970-09-12, 51 y.o., male Today's Date: 11/02/2021  PCP: Dr. Agustina Caroli  REFERRING PROVIDER: Dr. Sherilyn Cooter    OT End of Session - 11/02/21 0902     Visit Number 1    Number of Visits 16    Date for OT Re-Evaluation 01/08/22    Authorization Type UHC    Authorization - Number of Visits 20    OT Start Time 0845    OT Stop Time 0928    OT Time Calculation (min) 43 min    Equipment Utilized During Treatment orthotic materials    Activity Tolerance Patient tolerated treatment well;No increased pain;Patient limited by fatigue;Patient limited by pain    Behavior During Therapy [facility] for tasks assessed/performed             Past Medical History:  Diagnosis Date   Arthritis    knees   Asthma    Biceps rupture, proximal    right   Heart murmur    as child   Past Surgical History:  Procedure Laterality Date   BICEPT TENODESIS Right 09/30/2021   Procedure: RIGHT BICEPS TENODESIS;  Surgeon: Leandrew Koyanagi, MD;  Location: 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   Traumatic rupture of right biceps tendon 09/30/2021   Low back pain 01/23/2021   Chronic musculoskeletal pain 10/02/2020   History of asthma 10/02/2020   History of multiple allergies 10/02/2020   Low testosterone 02/07/2018   Screening for prostate cancer 11/28/2017    ONSET DATE: 09/23/21 DOI  REFERRING DIAG: M20.022 (ICD-10-CM) - 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 At work, he states having to lift up to 100# "constantly" during the day. He also has right shoulder sx 09/30/21 with reported biceps tenodesis, and OT and PT recommends no heavy lifting for 1-3 more months, most likely. Also for left hand issue, OT recommends no heavy lifting in left hand for 1 -2 more months as well. He also states needing another sx for hernia at some point. He states these issues are making daily tasks difficult.    PERTINENT HISTORY: Per MD: "Non-operative treatment of left small finger boutonierre deformity.  Needs to regain passive extension at PIP joint." "Patient has a left small finger central slip rupture with subsequent boutonniere deformity.  He has a 50 degree extensor lag with an approximately 20 degree flexion contracture at the PIP joint.  Fortunately his DIP joint remains supple."  PRECAUTIONS: attempt to regain/maintain passive ext of PIP J and A/PROM flexion of DIP J while healing up to 6 weeks.   WEIGHT BEARING RESTRICTIONS Yes No strong weight bearing in left hand for at least 4 more weeks.   PAIN:  Are you  having pain? Yes Rating: 5.5/10 at rest now in left hand, it gets worse when he tries to use his hand  FALLS: Has patient fallen in last 6 months? No  LIVING ENVIRONMENT: Lives with: lives with their spouse  PLOF: Independent  PATIENT GOALS to get hand better for home and work use.    OBJECTIVE:   HAND DOMINANCE: Right   ADLs: Overall ADLs: States decreased ability to wash himself, do FMS like tying shoes, holding cups and house hold objects, care for dog, can't do work tasks, etc.    FUNCTIONAL OUTCOME MEASURES: Eval:  Patient Specific Functional Scale: 2.6 (showering, holding cups, tying  shoes)  UPPER EXTREMITY ROM     AROM Left eval  Wrist flexion   Wrist extension   Wrist ulnar deviation   Wrist radial deviation   Wrist pronation   Wrist supination   (Blank rows = not tested)  ROM Right eval Left AROM / PROM eval  Ring MCP (0-90)      Ring PIP (0-100)      Ring DIP (0-70)      Little MCP (0-90) 0- 84 (+10) - 92 AROM   Little PIP (0-100) (-16*) -  94 (-52) - 94   /   (-34) - 96  Little DIP (0-70) 0-  59 (+5) - (-2)   /   (+5) - 24*   (Blank rows = not tested)   UPPER EXTREMITY MMT:   decreased strength in left hand, other MMT not indicated at eval   HAND FUNCTION: Eval: Grip strength Right: 70 lbs, Left: TBD lbs   COORDINATION: Eval: overtly decrease/impaired coordination of left hand now considering deformity, pain and weakness. Specifics TBD.  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 This may show limited ability to get left SF PIP J to full extension.    TODAY'S TREATMENT:  Eval: Custom orthotic fabrication was indicated due to pt's healing central slip extensor tendon injury and need for safe, functional positioning. OT fabricated custom static serial cast orthotic circumferentially around left SF PIP J in max extension, extending to DIP J (in slight flexion) for pt today to let SF heal in optimal, functional positioning. It fit well with no areas of pressure, pt states a comfortable fit. Pt was educated on the wearing schedule, to call or come in ASAP if it is causing any irritation or is not achieving desired function. It will need adjusted 1 - 2 times a week most likely, to allot for changes in  ROM and healing. Pt states understanding.   OT also edu on body structures, functions, mechanisms of injury. Also basic HEP for moving uninvolved joints, also self-care edu to cover hand when showering and safety- to not lift anything heavy in left hand for at least 4 weeks to allow be chance for healing.    PATIENT EDUCATION: Education details: See tx section above for details  Person educated: Patient Education method: Verbal Instruction, Teach back, Handouts  Education comprehension: States and demonstrates understanding, Additional Education required    HOME EXERCISE PROGRAM: See tx section above for details  GOALS: Goals reviewed with patient? Yes   SHORT TERM GOALS: (STG required if POC>30 days)  Pt will obtain protective, custom orthotic. Target date: 11/02/21 Goal status: MET  2.  Pt will demo/state understanding of initial HEP to improve pain levels and prerequisite motion. Target date: 11/02/21 Goal status: INITIAL   LONG TERM GOALS:  Pt will improve functional ability by decreased impairment per Patient Specific Functional Scale assessment from 2.6 to 7.5 or better, for better quality of life. Target date: 01/08/22 Goal status: INITIAL  2.  Pt will improve grip strength in left hand to at least 55lbs for functional use at home and in IADLs. Target date: 01/08/22 Goal status: INITIAL  3.  Pt will improve A/ROM in left SF TAM from 147* to at least 200*, to have functional motion for tasks FMS fasteners and grasp.  Target date: 01/08/22 Goal status: INITIAL  4.  Pt will decrease pain at rest from 5.5/10 to 1/10 or better to have better sleep and occupational participation in daily roles. Target date: 01/08/22 Goal status: INITIAL   ASSESSMENT:  CLINICAL IMPRESSION: Patient is a 51 y.o. male who was seen today for occupational therapy evaluation for left hand tendon rupture and subsequent deformity, decreased functional ability.   PERFORMANCE DEFICITS in  functional skills including IADLs, coordination, dexterity, ROM, strength, pain, fascial restrictions, flexibility, [place], GMC, body mechanics, decreased knowledge of precautions, and UE functional use, cognitive skills including safety awareness, and psychosocial skills including coping strategies, habits, and routines and behaviors.   IMPAIRMENTS are limiting patient from IADLs, work, play, and leisure.   COMORBIDITIES has co-morbidities such as asthma, allergies, low back pain, and others  that affects occupational performance. Patient will benefit from skilled OT to address above impairments and improve overall function.  MODIFICATION OR ASSISTANCE TO COMPLETE EVALUATION: No modification of tasks or assist necessary to complete an evaluation.  OT OCCUPATIONAL PROFILE AND HISTORY: Problem focused assessment: Including review of records relating to presenting problem.  CLINICAL DECISION MAKING: LOW - limited treatment options, no task modification necessary  REHAB POTENTIAL: Good  EVALUATION COMPLEXITY: Low      PLAN: OT FREQUENCY: 1-2x/week (for orthotic adjustments as indicated, until finger is actively flexible and he is able to control flexion), then in~6 weeks, he may need further therapy to regain motion and strength in left finger/hand.   OT DURATION: 10 weeks  PLANNED INTERVENTIONS: self care/ADL training, therapeutic exercise, therapeutic activity, manual therapy, passive range of motion, splinting, compression bandaging, moist heat, cryotherapy, contrast bath, patient/family education, coping strategies training, DME and/or AE instructions, and Re-evaluation  RECOMMENDED OTHER SERVICES: none now   CONSULTED AND AGREED WITH PLAN OF CARE: Patient  PLAN FOR NEXT SESSION: check and adjust orthotic as needed    Benito Mccreedy, OTR/L, CHT 11/02/2021, 1:24 PM

## 2021-11-02 NOTE — Therapy (Signed)
OUTPATIENT PHYSICAL THERAPY TREATMENT NOTE   Patient Name: Darin Hart MRN: [id number] DOB:04/27/1971, 51 y.o., male Today's Date: 11/02/2021  PCP: Horald Pollen.   REFERRING PROVIDER: Aundra Dubin, PA-C  END OF SESSION:   PT End of Session - 11/02/21 0818     Visit Number 2    Number of Visits 20    Date for PT Re-Evaluation 01/01/22    Authorization Type [employer] $20 copay 20 PT visits per year    Authorization - Visit Number 1    Authorization - Number of Visits 20    Progress Note Due on Visit 10    PT Start Time 0802    PT Stop Time 0842    PT Time Calculation (min) 40 min    Activity Tolerance Patient limited by pain    Behavior During Therapy [facility] for tasks assessed/performed             Past Medical History:  Diagnosis Date   Arthritis    knees   Asthma    Biceps rupture, proximal    right   Heart murmur    as child   Past Surgical History:  Procedure Laterality Date   BICEPT TENODESIS Right 09/30/2021   Procedure: RIGHT BICEPS TENODESIS;  Surgeon: Leandrew Koyanagi, MD;  Location: [facility];  Service: Orthopedics;  Laterality: Right;   HERNIA REPAIR     ORIF FINGER / THUMB FRACTURE Right    RHINOPLASTY     Spontaneous pneumothorax Left    20 yrs ago   Patient Active Problem List   Diagnosis Date Noted   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   Traumatic rupture of right biceps tendon 09/30/2021   Low back pain 01/23/2021   Chronic musculoskeletal pain 10/02/2020   History of asthma 10/02/2020   History of multiple allergies 10/02/2020   Low testosterone 02/07/2018   Screening for prostate cancer 11/28/2017    REFERRING DIAG: [id number] (ICD-10-CM) - Biceps tendonosis of right shoulder  ONSET DATE: 09/30/2021 Surgery Rt biceps Tenodesis  THERAPY DIAG:  Chronic right shoulder pain  Muscle weakness (generalized)  Stiffness of right shoulder joint  Localized edema  Rationale for Evaluation and  Treatment Rehabilitation  PERTINENT HISTORY: History of Rt shoulder rotator cuff repair (years ago per Pt)  PRECAUTIONS: Shoulder -No lifting greater than 2 lbs through the elbow, no strengthening 6 weeks  SUBJECTIVE: Pt indicated having pain upon arrival at 9/10.  Pt indicated consistent pain most days.  Tried exercises a little but was very painful.   PAIN:  NPRS scale: current:  9/10 Pain location: Rt shoulder/arm Pain description: sharp, numbing pain Aggravating factors: constant pain overall, movement, nighttime pain Relieving factors: medicine, propping/wearing sling.   OBJECTIVE:    PATIENT SURVEYS:  10/23/2021: FOTO intake:  26  predicted:  64   COGNITION: 10/23/2021: Overall cognitive status: Within functional limits for tasks assessed                                     SENSATION: 10/23/2021: [facility]   EDEMA 10/23/2021: Localized edema to Rt shoulder noted visually.    POSTURE: 10/23/2021: High guard of Rt shoulder (no sling today)   UPPER EXTREMITY ROM:    ROM Right 10/23/2021 Left 10/23/2021 Right 11/02/2021  Shoulder flexion PROM 66 deg in supine WFL PROM 90 deg in supine  Shoulder extension  Shoulder abduction PROM 56 in supine WFL PROM 90 deg in supine  Shoulder adduction       Shoulder internal rotation PROM 45 deg in 30 deg abduction supine     Shoulder external rotation PROM 0 deg in 30 deg abduction supine     Elbow flexion       Elbow extension       Wrist flexion       Wrist extension       Wrist ulnar deviation       Wrist radial deviation       Wrist pronation       Wrist supination         10/23/2021:  All Rt shoulder PROM measured above primarily limited due to pain response.    (Blank rows = not tested)   UPPER EXTREMITY MMT:   MMT Right eval Left eval  Shoulder flexion 1+/5 5/5  Shoulder extension      Shoulder abduction 1+/5 5/5  Shoulder adduction      Shoulder internal rotation Not tested 5/5  Shoulder external rotation Not tested 5/5   Middle trapezius      Lower trapezius      Elbow flexion Not tested 5/5  Elbow extension Not tested 5/5  Wrist flexion      Wrist extension      Wrist ulnar deviation      Wrist radial deviation      Wrist pronation      Wrist supination      Grip strength (lbs)      (Blank rows = not tested)   JOINT MOBILITY TESTING:              10/23/2021: Normal Rt GH joint mobility in mid range motion in inferior direction.  Mild limit in posterior direction.    PALPATION:  10/23/2021: Tenderness noted in Rt shoulder/arm anterior shoulder jt, axillary aspect of upper arm, anterior/middle deltoid.               TODAY'S TREATMENT:  11/02/2021: Therex:             Supine PROM flexion c Lt arm moving Rt 2-3 sec hold x 10  Supine AAROM 1 lb bar flexion 2-3 sec hold x 10    Review of existing HEP.    Manual;             G2-g3 inferior joint mobs Rt shoulder in mid to end range for mobility gains/symptom relief  Vasopneumatic  Rt shoulder medium compression 34 degrees 10 mins  10/23/2021: Therex:             HEP instruction/performance c cues for techniques, handout provided.  Trial set performed of each for comprehension and symptom assessment.  See below for exercise list.   Manual;             G2-g3 inferior joint mobs Rt shoulder in mid range for mobility gains/symptom relief     PATIENT EDUCATION: 10/23/2021: Education details: HEP, POC Person educated: Patient Education method: Consulting civil engineer, Media planner, Verbal cues, and Handouts Education comprehension: verbalized understanding, returned demonstration, and verbal cues required     HOME EXERCISE PROGRAM: Access Code: [email or web address] URL: [email or web address] Date: 10/23/2021 Prepared by: Scot Jun   Exercises - Seated Scapular Retraction  - 3-5 x daily - 7 x weekly - 1 sets - 10 reps - 3-5 hold - Supine Shoulder Flexion PROM (Mirrored)  - 2-3 x daily - 7 x weekly - 1-2 sets -  10 reps - 5 hold - Supine Shoulder  External Rotation with [place] (Mirrored)  - 2-3 x daily - 7 x weekly - 1-2 sets - 10 reps - 5 hold - Standing Circular Shoulder Pendulum Supported with Arm Bent  - 2-3 x daily - 7 x weekly - 1-2 sets - 10 reps - Standing Flexion Extension Shoulder Pendulum Supported with Arm Bent  - 2-3 x daily - 7 x weekly - 1-2 sets - 10 reps - Standing Horizontal Shoulder Pendulum Supported with Arm Bent  - 2-3 x daily - 7 x weekly - 1-2 sets - 10 reps   ASSESSMENT:   CLINICAL IMPRESSION: Consistent pain and muscle guarding noted in passive mobility assessment within clinic and also reported at home.  Despite pain reporting, improved passive mobility noted and improved tolerance to HEP movements passive/AAROM as noted within clinic.  Continued skilled PT services indicated at this time.      OBJECTIVE IMPAIRMENTS decreased activity tolerance, decreased coordination, decreased endurance, decreased mobility, decreased ROM, decreased strength, hypomobility, increased edema, increased fascial restrictions, impaired perceived functional ability, impaired flexibility, impaired UE functional use, improper body mechanics, and pain.    ACTIVITY LIMITATIONS carrying, lifting, sleeping, dressing, and reach over head   PARTICIPATION LIMITATIONS: meal prep, cleaning, laundry, interpersonal relationship, driving, shopping, community activity, occupation, and exercises   PERSONAL FACTORS Past/current experiences (previous Rt shoulder rotator cuff surgery reported) are also affecting patient's functional outcome.    REHAB POTENTIAL: Good   CLINICAL DECISION MAKING: Stable/uncomplicated   EVALUATION COMPLEXITY: Low     GOALS: Goals reviewed with patient? Yes   Short term PT Goals (target date for Short term goals are 3 weeks (11/13/2021) Patient will demonstrate independent use of home exercise program to maintain progress from in clinic treatments. Goal status: on going - assessed 11/02/2021   Long term PT goals  (target dates for all long term goals are 10 weeks  01/01/2022 )   1. Patient will demonstrate/report pain at worst less than or equal to 2/10 to facilitate minimal limitation in daily activity secondary to pain symptoms. Goal status: New   2. Patient will demonstrate independent use of home exercise program to facilitate ability to maintain/progress functional gains from skilled physical therapy services. Goal status: New   3. Patient will demonstrate FOTO outcome > or = 64 % to indicate reduced disability due to condition. Goal status: New   4.  Patient will demonstrate Rt [place] joint mobility WFL to facilitate usual self care, dressing, reaching overhead at PLOF s limitation due to symptoms.  Goal status: New   5.  Patient will demonstrate Rt UE MMT 5/5 throughout to facilitate usual lifting, carrying in functional activity to PLOF s limitation.   Goal status: New   6.  Patient will demonstrate/report ability to return to work at [employer] s restriction.   Goal status: New   7.  Patient will indicate ability to sleep in bed unrestricted due to symptoms.  a.  Goal Status: New     PLAN: PT FREQUENCY: 2x/week then transitioning to 1-2x/week as able to preserve visit number for length of POC   PT DURATION: 10 weeks   PLANNED INTERVENTIONS: Therapeutic exercises, Therapeutic activity, Neuro Muscular re-education, Balance training, Gait training, Patient/Family education, Joint mobilization, Stair training, DME instructions, Dry Needling, Electrical stimulation, Cryotherapy, Moist heat, Taping, Ultrasound, Ionotophoresis 4mg /ml Dexamethasone, and Manual therapy.  All included unless contraindicated   PLAN FOR NEXT SESSION: PROM/AAROM with no lifting at this time, manual  for mobility gains/symptoms, vaso post intervention  Scot Jun, PT, DPT, OCS, ATC 11/02/21  8:35 AM

## 2021-11-06 ENCOUNTER — Encounter: Payer: Self-pay | Admitting: Rehabilitative and Restorative Service Providers"

## 2021-11-06 ENCOUNTER — Ambulatory Visit (INDEPENDENT_AMBULATORY_CARE_PROVIDER_SITE_OTHER): Payer: 59 | Admitting: Rehabilitative and Restorative Service Providers"

## 2021-11-06 DIAGNOSIS — M6281 Muscle weakness (generalized): Secondary | ICD-10-CM

## 2021-11-06 DIAGNOSIS — M25642 Stiffness of left hand, not elsewhere classified: Secondary | ICD-10-CM | POA: Diagnosis not present

## 2021-11-06 DIAGNOSIS — R600 Localized edema: Secondary | ICD-10-CM

## 2021-11-06 DIAGNOSIS — R278 Other lack of coordination: Secondary | ICD-10-CM

## 2021-11-06 DIAGNOSIS — M25542 Pain in joints of left hand: Secondary | ICD-10-CM | POA: Diagnosis not present

## 2021-11-06 NOTE — Therapy (Signed)
OUTPATIENT OCCUPATIONAL THERAPY TREATMENT NOTE   Patient Name: Darin Hart MRN: [id number] DOB:1970/07/15, 51 y.o., male Today's Date: 11/06/2021  PCP: Dr. Agustina Caroli  REFERRING PROVIDER: Dr. Sherilyn Cooter   END OF SESSION:   OT End of Session - 11/06/21 0857     Visit Number 2    Number of Visits 16    Date for OT Re-Evaluation 01/08/22    Authorization Type [employer]    Authorization - Number of Visits 20    OT Start Time 0854    OT Stop Time 0915    OT Time Calculation (min) 21 min    Equipment Utilized During Treatment orthotic materials    Activity Tolerance Patient tolerated treatment well;No increased pain;Patient limited by fatigue;Patient limited by pain    Behavior During Therapy [facility] for tasks assessed/performed             Past Medical History:  Diagnosis Date   Arthritis    knees   Asthma    Biceps rupture, proximal    right   Heart murmur    as child   Past Surgical History:  Procedure Laterality Date   BICEPT TENODESIS Right 09/30/2021   Procedure: RIGHT BICEPS TENODESIS;  Surgeon: Leandrew Koyanagi, MD;  Location: [place];  Service: Orthopedics;  Laterality: Right;   HERNIA REPAIR     ORIF FINGER / THUMB FRACTURE Right    RHINOPLASTY     Spontaneous pneumothorax Left    20 yrs ago   Patient Active Problem List   Diagnosis Date Noted   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   Traumatic rupture of right biceps tendon 09/30/2021   Low back pain 01/23/2021   Chronic musculoskeletal pain 10/02/2020   History of asthma 10/02/2020   History of multiple allergies 10/02/2020   Low testosterone 02/07/2018   Screening for prostate cancer 11/28/2017    ONSET DATE: 09/23/21 DOI   REFERRING DIAG: M20.022 (ICD-10-CM) - 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  Needs to regain passive extension at PIP joint." "Patient has a left small finger central slip rupture with subsequent boutonniere deformity.  He has a 50 degree extensor lag with an approximately 20 degree flexion contracture at the PIP joint.  Fortunately his DIP joint remains supple."   PRECAUTIONS: attempt to regain/maintain passive ext of PIP J and A/PROM flexion of DIP J while healing up to 6 weeks.    WEIGHT BEARING RESTRICTIONS Yes No strong weight bearing in left hand for at least 4 more weeks.    SUBJECTIVE:  He states orthotic fit well most of the week, but started to get loose and fell off yesterday while walking the dog, he had some tingling pains after that. He put orthotic back on.   PAIN:  Are you having pain? No, but mildly tingling still Rating: 0/10 at rest now    OBJECTIVE: (All objective assessments below are from initial evaluation on: 11/02/21 unless otherwise specified.)   HAND DOMINANCE: Right    ADLs: Overall ADLs: States decreased ability to wash himself, do FMS like tying shoes, holding cups and house hold objects, care for dog, can't do work tasks,  etc.      FUNCTIONAL OUTCOME MEASURES: Eval:  Patient Specific Functional Scale: 2.6 (showering, holding cups, tying shoes)   UPPER EXTREMITY ROM      AROM Left eval  Wrist flexion    Wrist extension    Wrist ulnar deviation    Wrist radial deviation    Wrist pronation    Wrist supination    (Blank rows = not tested)   ROM Right eval Left AROM / PROM eval LEFT 11/06/21  Ring MCP (0-90)       Ring PIP (0-100)       Ring DIP (0-70)     PROM below  Little MCP (0-90) 0- 84 (+10) - 92 AROM    Little PIP (0-100) (-16*) -  94 (-52) - 94   /   (-34) - 96 (-34) PROM ext  Little DIP (0-70) 0-  59 (+5) - (-2)   /    (+5) - 24*    (Blank rows = not tested)     UPPER EXTREMITY MMT:   decreased strength in left hand, other MMT not indicated at eval    HAND FUNCTION: Eval: Grip strength Right: 70 lbs, Left: TBD lbs    COORDINATION: Eval: overtly decrease/impaired coordination of left hand now considering deformity, pain and weakness. Specifics TBD.  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 This may show limited ability to get left SF PIP J to full extension.      TODAY'S TREATMENT:  11/06/21: OT removed past orthotic with took some pressure to slip off, and it's likely that it didn't just "fall off" yesterday- it had to have taken some force from his 80 lbs dog. He was advised again for safety to not lift more than 1-2 # with left hand (which would exclude walking 80# dog who "likes to pull").  OT takes new measures then fabricates new, tighter, static, progressive orthosis with PIP J in max ext, DIP J in slight flexion. He states he feels the force but no pain.    Eval: Custom orthotic fabrication was indicated due to pt's healing central slip extensor tendon injury and need for safe, functional positioning. OT fabricated custom static serial cast orthotic circumferentially around left SF PIP J in max extension, extending to DIP J (in slight flexion) for pt today to let SF heal in optimal, functional positioning. It fit well with no areas of pressure, pt states a comfortable fit. Pt was educated on the wearing schedule, to call or come in ASAP if it is causing  any irritation or is not achieving desired function. It will need adjusted 1 - 2 times a week most likely, to allot for changes in ROM and healing. Pt states understanding.    OT also edu on body structures, functions, mechanisms of injury. Also basic HEP for moving uninvolved joints, also self-care edu to cover hand when showering and  safety- to not lift anything heavy in left hand for at least 4 weeks to allow be chance for healing.      PATIENT EDUCATION: Education details: See tx section above for details  Person educated: Patient Education method: Verbal Instruction, Teach back, Handouts  Education comprehension: States and demonstrates understanding, Additional Education required      HOME EXERCISE PROGRAM: See tx section above for details    GOALS: Goals reviewed with patient? Yes     SHORT TERM GOALS: (STG required if POC>30 days)   Pt will obtain protective, custom orthotic. Target date: 11/02/21 Goal status: MET   2.  Pt will demo/state understanding of initial HEP to improve pain levels and prerequisite motion. Target date: 11/02/21 Goal status: INITIAL     LONG TERM GOALS:   Pt will improve functional ability by decreased impairment per Patient Specific Functional Scale assessment from 2.6 to 7.5 or better, for better quality of life. Target date: 01/08/22 Goal status: INITIAL   2.  Pt will improve grip strength in left hand to at least 55lbs for functional use at home and in IADLs. Target date: 01/08/22 Goal status: INITIAL   3.  Pt will improve A/ROM in left SF TAM from 147* to at least 200*, to have functional motion for tasks FMS fasteners and grasp.  Target date: 01/08/22 Goal status: INITIAL   4.  Pt will decrease pain at rest from 5.5/10 to 1/10 or better to have better sleep and occupational participation in daily roles. Target date: 01/08/22 Goal status: INITIAL     ASSESSMENT:   CLINICAL IMPRESSION: 11/06/21: He is more swollen and just as flexed as  Monday, unfortunately, and this may be due to somewhat traumatic sounding exacerbation yesterday when his dog pulled the leash, pulling off his orthotic causing new onset tingling pain through his finger.  He is strongly advised against any weighted activity, which is difficult due to healing right shoulder sx.   He also states seeking abd sx this Aug, which he is advised against unless his shoulder is healed, at least (unless it is emergent sx).    Eval: Patient is a 51 y.o. male who was seen today for occupational therapy evaluation for left hand tendon rupture and subsequent deformity, decreased functional ability.      PLAN: OT FREQUENCY: 1-2x/week (for orthotic adjustments as indicated, until finger is actively flexible and he is able to control flexion), then in~6 weeks, he may need further therapy to regain motion and strength in left finger/hand.    OT DURATION: 10 weeks   PLANNED INTERVENTIONS: self care/ADL training, therapeutic exercise, therapeutic activity, manual therapy, passive range of motion, splinting, compression bandaging, moist heat, cryotherapy, contrast bath, patient/family education, coping strategies training, DME and/or AE instructions, and Re-evaluation   RECOMMENDED OTHER SERVICES: none now    CONSULTED AND AGREED WITH PLAN OF CARE: Patient   PLAN FOR NEXT SESSION:  check and adjust orthotic as needed    Benito Mccreedy, OTR/L, CHT 11/06/2021, 9:20 AM

## 2021-11-10 ENCOUNTER — Ambulatory Visit (INDEPENDENT_AMBULATORY_CARE_PROVIDER_SITE_OTHER): Payer: 59 | Admitting: Physician Assistant

## 2021-11-10 DIAGNOSIS — M67813 Other specified disorders of tendon, right shoulder: Secondary | ICD-10-CM

## 2021-11-10 MED ORDER — DICLOFENAC SODIUM 75 MG PO TBEC: 75.0000 mg | DELAYED_RELEASE_TABLET | Freq: Two times a day (BID) | ORAL | 2 refills | Status: DC

## 2021-11-10 NOTE — Progress Notes (Signed)
   Post-Op Visit Note   Patient: Darin Hart           Date of Birth: Dec 25, 1970           MRN: [id number] Visit Date: 11/10/2021 PCP: Georgina Quint, MD   Assessment & Plan:  Chief Complaint:  Chief Complaint  Patient presents with   Right Shoulder - Routine Post Op   Visit Diagnoses:  1. Biceps tendonosis of right shoulder     Plan: Patient is a pleasant 51 year old gentleman who comes in today 6 weeks status post right shoulder open biceps tenodesis 09/30/2021.  He was doing well until he started physical therapy where he has been experiencing increased pain but has worsened over the past week.  No fevers or chills.  He denies any injury.  Examination of the right shoulder reveals a fully healed surgical scar without complication.  Range of motion of the shoulder is to about 90 degrees of flexion and abduction.  He is neurovascular intact distally.  At this point, I would like to start him on an anti-inflammatory.  I would also like for him to take about 2 weeks off from formal physical therapy but continue to work on gentle range of motion at home.  He will follow-up in 4 weeks time for recheck.  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  Call with concerns or questions in the meantime.  Follow-Up Instructions: Return in about 4 weeks (around 12/08/2021).   Orders:  No orders of the defined types were placed in this encounter.  Meds ordered this encounter  Medications   diclofenac (VOLTAREN) 75 MG EC tablet    Sig: Take 1 tablet (75 mg total) by mouth 2 (two) times daily.    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   Traumatic rupture of right biceps tendon 09/30/2021   Low back pain 01/23/2021   Chronic musculoskeletal pain 10/02/2020   History of asthma 10/02/2020   History of multiple  allergies 10/02/2020   Low testosterone 02/07/2018   Screening for prostate cancer 11/28/2017   Past Medical History:  Diagnosis Date   Arthritis    knees   Asthma    Biceps rupture, proximal    right   Heart murmur    as child    Family History  Problem Relation Age of Onset   Hypertension Mother    Other Neg Hx        hypogonadism    Past Surgical History:  Procedure Laterality Date   BICEPT TENODESIS Right 09/30/2021   Procedure: RIGHT BICEPS TENODESIS;  Surgeon: Tarry Kos, MD;  Location: [facility];  Service: Orthopedics;  Laterality: Right;   HERNIA REPAIR     ORIF FINGER / THUMB FRACTURE Right    RHINOPLASTY     Spontaneous pneumothorax Left    20 yrs ago   Social History   Occupational History   Not on file  Tobacco Use   Smoking status: Never   Smokeless tobacco: Never  Vaping Use   Vaping Use: Never used  Substance and Sexual Activity   Alcohol use: Yes    Comment: OCCASIONALLY   Drug use: Never   Sexual activity: Yes    Partners: Female

## 2021-11-11 ENCOUNTER — Telehealth: Payer: Self-pay | Admitting: Orthopedic Surgery

## 2021-11-11 ENCOUNTER — Telehealth: Payer: Self-pay | Admitting: Orthopaedic Surgery

## 2021-11-11 ENCOUNTER — Ambulatory Visit: Payer: 59 | Admitting: Orthopaedic Surgery

## 2021-11-11 ENCOUNTER — Ambulatory Visit (INDEPENDENT_AMBULATORY_CARE_PROVIDER_SITE_OTHER): Payer: 59 | Admitting: Rehabilitative and Restorative Service Providers"

## 2021-11-11 ENCOUNTER — Encounter: Payer: Self-pay | Admitting: Rehabilitative and Restorative Service Providers"

## 2021-11-11 ENCOUNTER — Encounter: Payer: 59 | Admitting: Rehabilitative and Restorative Service Providers"

## 2021-11-11 DIAGNOSIS — M25642 Stiffness of left hand, not elsewhere classified: Secondary | ICD-10-CM

## 2021-11-11 DIAGNOSIS — R278 Other lack of coordination: Secondary | ICD-10-CM

## 2021-11-11 DIAGNOSIS — R600 Localized edema: Secondary | ICD-10-CM

## 2021-11-11 DIAGNOSIS — M25542 Pain in joints of left hand: Secondary | ICD-10-CM

## 2021-11-11 DIAGNOSIS — M6281 Muscle weakness (generalized): Secondary | ICD-10-CM

## 2021-11-11 NOTE — Therapy (Signed)
OUTPATIENT OCCUPATIONAL THERAPY TREATMENT NOTE   Patient Name: Darin Hart MRN: [id number] DOB:02-Dec-1970, 51 y.o., male Today's Date: 11/11/2021  PCP: Dr. Agustina Caroli  REFERRING PROVIDER: Dr. Sherilyn Cooter   END OF SESSION:   OT End of Session - 11/11/21 1103     Visit Number 3    Number of Visits 16    Date for OT Re-Evaluation 01/08/22    Authorization Type U[employer]    Authorization - Number of Visits 20    OT Start Time 1103    OT Stop Time 1149    OT Time Calculation (min) 46 min    Equipment Utilized During Treatment orthotic materials    Activity Tolerance Patient tolerated treatment well;No increased pain;Patient limited by fatigue;Patient limited by pain    Behavior During Therapy [facility] for tasks assessed/performed              Past Medical History:  Diagnosis Date   Arthritis    knees   Asthma    Biceps rupture, proximal    right   Heart murmur    as child   Past Surgical History:  Procedure Laterality Date   BICEPT TENODESIS Right 09/30/2021   Procedure: RIGHT BICEPS TENODESIS;  Surgeon: Leandrew Koyanagi, MD;  Location: [place];  Service: Orthopedics;  Laterality: Right;   HERNIA REPAIR     ORIF FINGER / THUMB FRACTURE Right    RHINOPLASTY     Spontaneous pneumothorax Left    20 yrs ago   Patient Active Problem List   Diagnosis Date Noted   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   Traumatic rupture of right biceps tendon 09/30/2021   Low back pain 01/23/2021   Chronic musculoskeletal pain 10/02/2020   History of asthma 10/02/2020   History of multiple allergies 10/02/2020   Low testosterone 02/07/2018   Screening for prostate cancer 11/28/2017    ONSET DATE: 09/23/21 DOI   REFERRING DIAG: M20.022 (ICD-10-CM) - 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  Needs to regain passive extension at PIP joint." "Patient has a left small finger central slip rupture with subsequent boutonniere deformity.  He has a 50 degree extensor lag with an approximately 20 degree flexion contracture at the PIP joint.  Fortunately his DIP joint remains supple."   PRECAUTIONS: 7 weeks post injury now; attempt to regain/maintain passive ext of PIP J and A/PROM flexion of DIP J while healing   WEIGHT BEARING RESTRICTIONS Yes No strong weight bearing in left hand for at least 3 more weeks.    SUBJECTIVE:  He again arrives with no orthotic on after being asked to be careful and not remove. He states again playing with his dog who "bit it off of his finger." OT advises that is very unsafe and he will likely have poor outcomes due to non-compliance with therapy recommendations. He states he "will try" to listen now. He also states choosing to withhold his PT for shoulder sx due to pain, and he is advised against this poor choice as well.    PAIN:  Are you having pain? Only in right shoulder now, hand not painful, but is swollen.  OBJECTIVE: (All objective assessments below are from initial evaluation on: 11/02/21 unless otherwise specified.)   HAND DOMINANCE: Right    ADLs: Overall ADLs: States decreased ability to wash himself, do FMS like tying shoes, holding cups and house hold objects, care for dog, can't do work tasks, etc.      FUNCTIONAL OUTCOME MEASURES: Eval:  Patient Specific Functional Scale: 2.6 (showering, holding cups, tying shoes)   UPPER EXTREMITY ROM      AROM Left TBD  Wrist flexion    Wrist extension    Wrist ulnar deviation    Wrist radial deviation    Wrist pronation    Wrist supination    (Blank rows = not tested)   ROM Right eval  Left AROM / PROM eval LEFT 11/06/21 Left 11/10/21 AROM  Ring MCP (0-90)        Ring PIP (0-100)        Ring DIP (0-70)     PROM below   Little MCP (0-90) 0- 84 (+10) - 92 AROM   0-80  Little PIP (0-100) (-16*) -  94 (-52) - 94   /   (-34) - 96 (-34) PROM ext (-34) - 90*  Little DIP (0-70) 0-  59 (+5) - (-2)   /   (+5) - 24*   (+15) - 20  (Blank rows = not tested)     UPPER EXTREMITY MMT:   decreased strength in left hand, other MMT not indicated at eval    HAND FUNCTION: Eval: Grip strength Right: 70 lbs, Left: TBD lbs    COORDINATION: Eval: overtly decrease/impaired coordination of left hand now considering deformity, pain and weakness. Specifics TBD.  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 This may show limited ability to get left SF PIP J to full extension.      TODAY'S TREATMENT:  11/10/21: Due to 7 weeks from injury now and repeated non-compliance with weight bearing precautions and orthotic wear, OT decides to D/C static progressive orthotics and move to move AROM approach. OT fabricates 2 new orthotics: a relative motion flexion orthotic that positions MCP J in flexion for increased work of force in PIP J extension & a resting/PIP J ext orthotic to wear at night and if RMO is not tolerated in day.  He was edu to wear RMO as much as possible in day, and switch to other orthotic if he has pain.  He was  also given HEP to accompany these that includes :   Exercises - PUSH KNUCKLES DOWN  - 4-6 x daily - 5 reps - 15 seconds hold - Hand AROM Reverse Blocking  - 4-6 x daily - 1 sets - 10-15 reps  He does well with these no added pain, but has some pressure with  RMO and stretches, etc. He is again asked to do no significant weight bearing but to use hand often for light activities as tolerated. He states understanding.     11/06/21: OT removed past orthotic with took some pressure to slip off, and it's likely that it didn't just "fall off" yesterday- it had to have taken some force from his 80 lbs dog. He was advised again for safety to not lift more than 1-2 # with left hand (which would exclude walking 80# dog who "likes to pull").  OT takes new measures then fabricates new, tighter, static, progressive orthosis with PIP J in max ext, DIP J in slight flexion. He states he feels the force but no pain.    Eval: Custom orthotic fabrication was indicated due to pt's healing central slip extensor tendon injury and need for safe, functional positioning. OT fabricated custom static serial cast orthotic circumferentially around left SF PIP J in max extension, extending to DIP J (in slight flexion) for pt today to let SF heal in optimal, functional positioning. It fit well with no areas of pressure, pt states a comfortable fit. Pt was educated on the wearing schedule, to call or come in ASAP if it is causing any irritation or is not achieving desired function. It will need adjusted 1 - 2 times a week most likely, to allot for changes in ROM and healing. Pt states understanding.    OT also edu on body structures, functions, mechanisms of injury. Also basic HEP for moving uninvolved joints, also self-care edu to cover hand when showering and safety- to not lift anything heavy in left hand for at least 4 weeks to allow be chance for healing.      PATIENT EDUCATION: Education details: See tx section above for  details  Person educated: Patient Education method: Verbal Instruction, Teach back, Handouts  Education comprehension: States and demonstrates understanding, Additional Education required      HOME EXERCISE PROGRAM: Access Code: [email or web address] URL: [email or web address] Date: 11/11/2021 Prepared by: Benito Mccreedy   GOALS: Goals reviewed with patient? Yes     SHORT TERM GOALS: (STG required if POC>30 days)   Pt will obtain protective, custom orthotic. Target date: 11/02/21 Goal status: MET   2.  Pt will demo/state understanding of initial HEP to improve pain levels and prerequisite motion. Target date: 11/02/21 Goal status: INITIAL     LONG TERM GOALS:   Pt will improve functional ability by decreased impairment per Patient Specific Functional Scale assessment from 2.6 to 7.5 or better, for better quality of life. Target date: 01/08/22 Goal status: INITIAL   2.  Pt will improve grip strength in left hand to at least 55lbs for functional use at home and in IADLs. Target date: 01/08/22 Goal status: INITIAL   3.  Pt will improve A/ROM in left SF TAM from 147* to at least 200*, to have functional motion for tasks FMS fasteners and grasp.  Target date: 01/08/22 Goal status: INITIAL   4.  Pt will decrease pain at rest from 5.5/10 to 1/10 or better to have better sleep and occupational participation in daily roles. Target date: 01/08/22 Goal status: INITIAL     ASSESSMENT:   CLINICAL IMPRESSION: 11/10/21: If he cannot stay consistent with new protocol, HEP and orthotics, he likely will end up with severe finger contracture and limited mobility, function, etc. and he was warned about this.   11/06/21: He is more swollen and just as flexed as Monday, unfortunately, and this may  be due to somewhat traumatic sounding exacerbation yesterday when his dog pulled the leash, pulling off his orthotic causing new onset tingling pain through his finger.  He is strongly advised  against any weighted activity, which is difficult due to healing right shoulder sx.   He also states seeking abd sx this Aug, which he is advised against unless his shoulder is healed, at least (unless it is emergent sx).    Eval: Patient is a 51 y.o. male who was seen today for occupational therapy evaluation for left hand tendon rupture and subsequent deformity, decreased functional ability.      PLAN: OT FREQUENCY: 1-2x/week (for orthotic adjustments as indicated, until finger is actively flexible and he is able to control flexion), then in~6 weeks, he may need further therapy to regain motion and strength in left finger/hand.    OT DURATION: 10 weeks   PLANNED INTERVENTIONS: self care/ADL training, therapeutic exercise, therapeutic activity, manual therapy, passive range of motion, splinting, compression bandaging, moist heat, cryotherapy, contrast bath, patient/family education, coping strategies training, DME and/or AE instructions, and Re-evaluation   RECOMMENDED OTHER SERVICES: none now    CONSULTED AND AGREED WITH PLAN OF CARE: Patient   PLAN FOR NEXT SESSION:  Check new orthotics and HEP, advance as tolerated. Consider dynamic extension orthotic as a final option if he still is not doing well.    Benito Mccreedy, OTR/L, CHT 11/11/2021, 2:42 PM

## 2021-11-11 NOTE — Telephone Encounter (Signed)
Sure that is fine.

## 2021-11-11 NOTE — Telephone Encounter (Signed)
Received disability paperwork from patient/Forwarding to [facility] today

## 2021-11-11 NOTE — Telephone Encounter (Signed)
Patient asked if he can get a letter extending his time out of work for his disability claim? Patient asked if the dates can be written for 09/30/2021 through 12/11/2021. The number to contact patient if needed is [phone]

## 2021-11-11 NOTE — Telephone Encounter (Signed)
Noted for [employer] ?

## 2021-11-11 NOTE — Telephone Encounter (Signed)
Patient asked if he can get a letter extending his time out of work for his disability claim? Patient asked if the dates can be written for 09/30/2021 through 12/11/2021. The number to contact patient if needed is [phone] 

## 2021-11-13 ENCOUNTER — Encounter: Payer: 59 | Admitting: Rehabilitative and Restorative Service Providers"

## 2021-11-17 ENCOUNTER — Telehealth: Payer: Self-pay | Admitting: Orthopedic Surgery

## 2021-11-17 ENCOUNTER — Encounter: Payer: Self-pay | Admitting: Rehabilitative and Restorative Service Providers"

## 2021-11-17 ENCOUNTER — Ambulatory Visit (INDEPENDENT_AMBULATORY_CARE_PROVIDER_SITE_OTHER): Payer: 59 | Admitting: Rehabilitative and Restorative Service Providers"

## 2021-11-17 ENCOUNTER — Encounter: Payer: 59 | Admitting: Rehabilitative and Restorative Service Providers"

## 2021-11-17 DIAGNOSIS — M25642 Stiffness of left hand, not elsewhere classified: Secondary | ICD-10-CM

## 2021-11-17 DIAGNOSIS — R278 Other lack of coordination: Secondary | ICD-10-CM | POA: Diagnosis not present

## 2021-11-17 DIAGNOSIS — M25542 Pain in joints of left hand: Secondary | ICD-10-CM

## 2021-11-17 DIAGNOSIS — M6281 Muscle weakness (generalized): Secondary | ICD-10-CM | POA: Diagnosis not present

## 2021-11-17 DIAGNOSIS — R600 Localized edema: Secondary | ICD-10-CM

## 2021-11-17 NOTE — Telephone Encounter (Signed)
Received medical records release form    Forwarding to [place] today

## 2021-11-19 ENCOUNTER — Encounter: Payer: 59 | Admitting: Rehabilitative and Restorative Service Providers"

## 2021-11-25 ENCOUNTER — Encounter: Payer: Self-pay | Admitting: Rehabilitative and Restorative Service Providers"

## 2021-11-25 ENCOUNTER — Ambulatory Visit (INDEPENDENT_AMBULATORY_CARE_PROVIDER_SITE_OTHER): Payer: 59 | Admitting: Rehabilitative and Restorative Service Providers"

## 2021-11-25 DIAGNOSIS — R600 Localized edema: Secondary | ICD-10-CM | POA: Diagnosis not present

## 2021-11-25 DIAGNOSIS — M6281 Muscle weakness (generalized): Secondary | ICD-10-CM

## 2021-11-25 DIAGNOSIS — R278 Other lack of coordination: Secondary | ICD-10-CM | POA: Diagnosis not present

## 2021-11-25 DIAGNOSIS — M25642 Stiffness of left hand, not elsewhere classified: Secondary | ICD-10-CM

## 2021-11-25 DIAGNOSIS — M25542 Pain in joints of left hand: Secondary | ICD-10-CM

## 2021-11-25 NOTE — Therapy (Addendum)
OUTPATIENT OCCUPATIONAL THERAPY TREATMENT & DISCHARGE NOTE   Patient Name: Darin Hart MRN: [id number] DOB:1971/03/26, 51 y.o., male Today's Date: 11/25/2021  PCP: Dr. Edwina Barth  REFERRING PROVIDER: Dr. Marlyne Beards   END OF SESSION:   OT End of Session - 11/25/21 0947     Visit Number 5    Number of Visits 16    Date for OT Re-Evaluation 01/08/22    Authorization Type UHC    Authorization - Number of Visits 20    OT Start Time [phone]    OT Stop Time 1020    OT Time Calculation (min) 33 min    Equipment Utilized During Treatment --    Activity Tolerance Patient tolerated treatment well;No increased pain;Patient limited by fatigue;Patient limited by pain    Behavior During Therapy [facility] for tasks assessed/performed                Past Medical History:  Diagnosis Date   Arthritis    knees   Asthma    Biceps rupture, proximal    right   Heart murmur    as child   Past Surgical History:  Procedure Laterality Date   BICEPT TENODESIS Right 09/30/2021   Procedure: RIGHT BICEPS TENODESIS;  Surgeon: Tarry Kos, MD;  Location: [facility];  Service: Orthopedics;  Laterality: Right;   HERNIA REPAIR     ORIF FINGER / THUMB FRACTURE Right    RHINOPLASTY     Spontaneous pneumothorax Left    20 yrs ago   Patient Active Problem List   Diagnosis Date Noted   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   Traumatic rupture of right biceps tendon 09/30/2021   Low back pain 01/23/2021   Chronic musculoskeletal pain 10/02/2020   History of asthma 10/02/2020   History of multiple allergies 10/02/2020   Low testosterone 02/07/2018   Screening for prostate cancer 11/28/2017    ONSET DATE: 09/23/21 DOI   REFERRING DIAG: M20.022 (ICD-10-CM) - 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  Needs to regain passive extension at PIP joint." "Patient has a left small finger central slip rupture with subsequent boutonniere deformity.  He has a 50 degree extensor lag with an approximately 20 degree flexion contracture at the PIP joint.  Fortunately his DIP joint remains supple."   PRECAUTIONS: ~9 weeks post injury now; attempt to regain/maintain passive ext of PIP J and A/PROM flexion of DIP J while healing   WEIGHT BEARING RESTRICTIONS Yes No weight bearing in left hand for at least 1-2 more weeks.    SUBJECTIVE:  He arrives quite late, he states the new dynamic orthotic was making finger straighter but it hurt so he did not wear it frequently. He also did not bring it for adjustment. He states wanting to get shoulder MRI due to pain.    PAIN:  Are you having pain?  Only in right shoulder now, hand not painful, but is swollen.  Rating: 0/10 at rest now IN HAND   OBJECTIVE: (All objective assessments below are from initial evaluation on: 11/02/21 unless otherwise specified.)   HAND DOMINANCE: Right    ADLs: Overall ADLs: States decreased ability  to wash himself, do FMS like tying shoes, holding cups and house hold objects, care for dog, can't do work tasks, etc.      FUNCTIONAL OUTCOME MEASURES: Eval:  Patient Specific Functional Scale: 2.6 (showering, holding cups, tying shoes)   UPPER EXTREMITY ROM      AROM Left 11/17/21  Wrist flexion 65   Wrist extension 50   (Blank rows = not tested)   ROM Right eval Left AROM / PROM eval LEFT 11/06/21 Left 11/10/21 AROM Left 11/17/21 Left 11/25/21  Ring MCP (0-90)          Ring PIP (0-100)          Ring DIP (0-70)     PROM below     Little MCP (0-90) 0- 84 (+10) - 92 AROM   0-80 (+10) - 88 0-90*  Little PIP (0-100) (-16*) -  94  (-52) - 94   /   (-34) - 96 (-34) PROM ext (-34) - 90* (-44) - 89   (-38* PROM ext)  (-48*) - 95* (-31* PROM ext)  Little DIP (0-70) 0-  59 (+5) - (-2)   /   (+5) - 24*   (+15) - 20 (+ 7) - 6 (+7) - 16  (Blank rows = not tested)     UPPER EXTREMITY MMT:   decreased strength in left hand, other MMT not indicated at eval    HAND FUNCTION: Eval: Grip strength Right: 70 lbs, Left: TBD lbs    COORDINATION: Eval: overtly decrease/impaired coordination of left hand now considering deformity, pain and weakness. Specifics TBD.  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 This may show limited ability to get left SF PIP J to full extension.      TODAY'S TREATMENT:  11/25/21:  OT adjusts static orthotic, straps, re-educates on dynamic orthotic wear to wear 4-5 x day for 15 -25 min periods as he states not tolerating for longer periods of time and taking it off. OT also reviewed HEP exercises for ext blocking ex, stretches straight and DIP flexion blocking AROM. He states understanding.   Exercises - PUSH KNUCKLES DOWN  - 4-6 x daily - 5 reps - 15 seconds hold - Hand AROM Reverse Blocking  - 4-6 x daily - 1 sets - 10-15 reps - Seated Finger DIP Flexion AROM with Blocking  - 4-6 x daily - 1 sets - 10-15 reps  11/17/21: He does AROM for new measures, which are somewhat worse today.  OT decides to fabricate new dynamic ext  orthotic for SF to extend P2 passively, allow active finger flexion. It took some time to make, but fits well and does intended purpose at end of session. He is toler he must wear it at least 5 hours in a  day and wear ext orthotic at night. He states understanding. OT also reviews HEP for finger ext stretches and blocking ext AROM. He states he will do. OT also re-edu for self-care and non-weight bearing in b/l arms and using caution, etc.    11/10/21: Due to 7 weeks from injury now and repeated non-compliance with weight bearing precautions and orthotic wear, OT decides to D/C static progressive orthotics and move to move AROM approach. OT fabricates 2 new orthotics: a relative motion flexion orthotic that positions MCP J in flexion for increased work of force in PIP J extension & a resting/PIP J ext orthotic to wear at night and if RMO is not tolerated in day.  He was edu to wear RMO as much as possible in day, and switch to other orthotic if he has pain.  He was also given HEP to accompany these that includes :   Exercises - PUSH KNUCKLES DOWN  - 4-6 x daily - 5 reps - 15 seconds hold - Hand AROM Reverse Blocking  - 4-6 x daily - 1 sets - 10-15 reps  He does well with these no added pain, but has some pressure with RMO and stretches, etc. He is again asked to do no significant weight bearing but to use hand often for light activities as tolerated. He states understanding.     PATIENT EDUCATION: Education details: See tx section above for details  Person educated: Patient Education method: Verbal Instruction, Teach back, Handouts  Education comprehension: States and demonstrates understanding, Additional Education required      HOME EXERCISE PROGRAM: Access Code: [email or web address] URL: [email or web address] Date: 11/11/2021 Prepared by: Fannie Knee   GOALS: Goals reviewed with patient? Yes     SHORT TERM GOALS: (STG required if POC>30 days)   Pt will obtain protective, custom  orthotic. Target date: 11/02/21 Goal status: MET   2.  Pt will demo/state understanding of initial HEP to improve pain levels and prerequisite motion. Target date: 11/02/21 Goal status: INITIAL     LONG TERM GOALS:   Pt will improve functional ability by decreased impairment per Patient Specific Functional Scale assessment from 2.6 to 7.5 or better, for better quality of life. Target date: 01/08/22 Goal status: INITIAL   2.  Pt will improve grip strength in left hand to at least 55lbs for functional use at home and in IADLs. Target date: 01/08/22 Goal status: INITIAL   3.  Pt will improve A/ROM in left SF TAM from 147* to at least 200*, to have functional motion for tasks FMS fasteners and grasp.  Target date: 01/08/22 Goal status: INITIAL   4.  Pt will decrease pain at rest from 5.5/10 to 1/10 or better to have better sleep and occupational participation in daily roles. Target date: 01/08/22 Goal status: INITIAL     ASSESSMENT:   CLINICAL IMPRESSION: 11/25/21: Due to IP J PROM > AROM now in ext and PROM is improved since last week it seems that orthotic has helped some, but he did not do active blocking extension as asked. Furthermore, flexion is improved seems like he is doing more active flexion and again not wearing ext braces as long as asked. He has been mostly non-compliant with POC to date. This is all reviewed. OT again stresses that unless he wears orthotics often and does HEP 4-6 x day as asked, he is going to have a poor outcome for finger ext.  11/17/21: He continues to be non-compliant with non-weight bearing b/l. He continues to  be mostly non-compliant with orthotics and bracing as asked and likely HEP as well. OT will now try dynamic orthosis as a "last attempt" to get his finger mobilized. He was made aware that if he isn't consistent with recommendations, his finger will not get better.    PLAN: OT FREQUENCY: 1-2x/week (for orthotic adjustments as indicated, until finger  is actively flexible and he is able to control flexion), then in~6 weeks, he may need further therapy to regain motion and strength in left finger/hand.    OT DURATION: 10 weeks   PLANNED INTERVENTIONS: self care/ADL training, therapeutic exercise, therapeutic activity, manual therapy, passive range of motion, splinting, compression bandaging, moist heat, cryotherapy, contrast bath, patient/family education, coping strategies training, DME and/or AE instructions, and Re-evaluation   RECOMMENDED OTHER SERVICES: none now    CONSULTED AND AGREED WITH PLAN OF CARE: Patient   PLAN FOR NEXT SESSION:  Due to him having a vacation and all needed HEP and orthotics, he will try to self-manage for 3-4 weeks and return for orthotic and motion checks and updates as needed.  (Possible D/C then or soon after)   Fannie Knee, OTR/L, CHT 11/25/2021, 10:27 AM    OCCUPATIONAL THERAPY DISCHARGE SUMMARY  Visits from Start of Care: 5  He stopped showing up to therapy and POC is officially being D/C at this point. See notes for details.   Fannie Knee, OTR/L, CHT 11/01/22

## 2021-11-26 ENCOUNTER — Ambulatory Visit: Payer: 59 | Admitting: Orthopedic Surgery

## 2021-11-30 ENCOUNTER — Ambulatory Visit (INDEPENDENT_AMBULATORY_CARE_PROVIDER_SITE_OTHER): Payer: 59 | Admitting: Rehabilitative and Restorative Service Providers"

## 2021-11-30 ENCOUNTER — Ambulatory Visit: Payer: 59 | Admitting: Orthopedic Surgery

## 2021-11-30 ENCOUNTER — Encounter: Payer: Self-pay | Admitting: Rehabilitative and Restorative Service Providers"

## 2021-11-30 DIAGNOSIS — M25511 Pain in right shoulder: Secondary | ICD-10-CM | POA: Diagnosis not present

## 2021-11-30 DIAGNOSIS — M25611 Stiffness of right shoulder, not elsewhere classified: Secondary | ICD-10-CM

## 2021-11-30 DIAGNOSIS — G8929 Other chronic pain: Secondary | ICD-10-CM

## 2021-11-30 DIAGNOSIS — R600 Localized edema: Secondary | ICD-10-CM

## 2021-11-30 DIAGNOSIS — M6281 Muscle weakness (generalized): Secondary | ICD-10-CM | POA: Diagnosis not present

## 2021-11-30 NOTE — Therapy (Signed)
OUTPATIENT PHYSICAL THERAPY TREATMENT NOTE   Patient Name: Darin Hart MRN: [id number] DOB:[birth year], 51 y.o., male Today's Date: 11/30/2021  PCP: Horald Pollen.   REFERRING PROVIDER: Aundra Dubin, PA-C  END OF SESSION:   PT End of Session - 11/30/21 0912     Visit Number 3    Number of Visits 20    Date for PT Re-Evaluation 01/01/22    Authorization Type UHC $20 copay 20 PT visits per year    Progress Note Due on Visit 10    PT Start Time 0901    PT Stop Time 0931    PT Time Calculation (min) 30 min    Activity Tolerance Patient tolerated treatment well    Behavior During Therapy [facility] [place] for tasks assessed/performed              Past Medical History:  Diagnosis Date   Arthritis    knees   Asthma    Biceps rupture, proximal    right   Heart murmur    as child   Past Surgical History:  Procedure Laterality Date   BICEPT TENODESIS Right 09/30/2021   Procedure: RIGHT BICEPS TENODESIS;  Surgeon: Leandrew Koyanagi, MD;  Location: [place];  Service: Orthopedics;  Laterality: Right;   HERNIA REPAIR     ORIF FINGER / THUMB FRACTURE Right    RHINOPLASTY     Spontaneous pneumothorax Left    20 yrs ago   Patient Active Problem List   Diagnosis Date Noted   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   Traumatic rupture of right biceps tendon 09/30/2021   Low back pain 01/23/2021   Chronic musculoskeletal pain 10/02/2020   History of asthma 10/02/2020   History of multiple allergies 10/02/2020   Low testosterone 02/07/2018   Screening for prostate cancer 11/28/2017    REFERRING DIAG: X72.620 (ICD-10-CM) - Biceps tendonosis of right shoulder  ONSET DATE: 09/30/2021 Surgery Rt biceps Tenodesis  THERAPY DIAG:  Chronic right shoulder pain  Stiffness of right shoulder joint  Muscle weakness (generalized)  Localized edema  Rationale for Evaluation and Treatment Rehabilitation  PERTINENT HISTORY: History of Rt  shoulder rotator cuff repair (years ago per Pt)  PRECAUTIONS: Shoulder -No lifting greater than 2 lbs through the elbow, no strengthening 6 weeks (has progressed out of precautions at this time - 11/30/2021)  SUBJECTIVE:  Pt indicated pain today upon arrival at 3/10.  Pt indicated continued sleeping trouble with pain up to 10/10.  Pt indicated he was doing exercises 1-2x/day  PAIN:  NPRS scale: Current 3/10, at worst 10/10 Pain location: Rt shoulder/arm Pain description: sharp, numbing pain Aggravating factors: constant pain overall, movement, nighttime pain Relieving factors: medicine, propping/wearing sling.   OBJECTIVE:    PATIENT SURVEYS:  10/23/2021: FOTO intake:  26  predicted:  64   COGNITION: 10/23/2021: Overall cognitive status: Within functional limits for tasks assessed                                     SENSATION: 10/23/2021: [facility]   EDEMA 10/23/2021: Localized edema to Rt shoulder noted visually.    POSTURE: 10/23/2021: High guard of Rt shoulder (no sling today)   UPPER EXTREMITY ROM:    ROM Right 10/23/2021 Left 10/23/2021 Right 11/02/2021 Right AROM (PROM)  Shoulder flexion PROM 66 deg in supine WFL PROM 90 deg in supine 118 (120) in supine  Shoulder extension        Shoulder abduction PROM 56 in supine WFL PROM 90 deg in supine 90(95) in supine  Shoulder adduction        Shoulder internal rotation PROM 45 deg in 30 deg abduction supine    50(50) in 30 deg abduction supine  Shoulder external rotation PROM 0 deg in 30 deg abduction supine    25(25) in 30 deg abduction supine  Elbow flexion        Elbow extension        Wrist flexion        Wrist extension        Wrist ulnar deviation        Wrist radial deviation        Wrist pronation        Wrist supination          10/23/2021:  All Rt shoulder PROM measured above primarily limited due to pain response.    (Blank rows = not tested)   UPPER EXTREMITY MMT:   MMT Right eval Left eval  Shoulder flexion 1+/5 5/5   Shoulder extension      Shoulder abduction 1+/5 5/5  Shoulder adduction      Shoulder internal rotation Not tested 5/5  Shoulder external rotation Not tested 5/5  Middle trapezius      Lower trapezius      Elbow flexion Not tested 5/5  Elbow extension Not tested 5/5  Wrist flexion      Wrist extension      Wrist ulnar deviation      Wrist radial deviation      Wrist pronation      Wrist supination      Grip strength (lbs)      (Blank rows = not tested)   JOINT MOBILITY TESTING:              10/23/2021: Normal Rt GH joint mobility in mid range motion in inferior direction.  Mild limit in posterior direction.    PALPATION:  10/23/2021: Tenderness noted in Rt shoulder/arm anterior shoulder jt, axillary aspect of upper arm, anterior/middle deltoid.               TODAY'S TREATMENT:  11/30/2021: Therex:            HEP progression and update c cues for techniques throughout  Supine wand AAROM 1 lb bar x 10  Supine AROM flexion x 10   Standing wand abduction AAROM 1 lb bar x 10   Standing doorway isometric painfree flexion, abdcution, ER Rt shoulder 5 sec x 10 each way   11/02/2021: Therex:             Supine PROM flexion c Lt arm moving Rt 2-3 sec hold x 10  Supine AAROM 1 lb bar flexion 2-3 sec hold x 10    Review of existing HEP.    Manual;             G2-g3 inferior joint mobs Rt shoulder in mid to end range for mobility gains/symptom relief  Vasopneumatic  Rt shoulder medium compression 34 degrees 10 mins  10/23/2021: Therex:             HEP instruction/performance c cues for techniques, handout provided.  Trial set performed of each for comprehension and symptom assessment.  See below for exercise list.   Manual;             G2-g3 inferior joint mobs Rt shoulder  in mid range for mobility gains/symptom relief     PATIENT EDUCATION: 11/30/2021: Education details: HEP, POC Person educated: Patient Education method: Consulting civil engineer, Media planner, Verbal cues, and  Handouts Education comprehension: verbalized understanding, returned demonstration, and verbal cues required     HOME EXERCISE PROGRAM: Access Code: [email or web address] URL: [email or web address] Date: 10/23/2021 Prepared by: Scot Jun   Exercises - Seated Scapular Retraction  - 3-5 x daily - 7 x weekly - 1 sets - 10 reps - 3-5 hold - Supine Shoulder Flexion PROM (Mirrored)  - 2-3 x daily - 7 x weekly - 1-2 sets - 10 reps - 5 hold - Supine Shoulder External Rotation with [place] (Mirrored)  - 2-3 x daily - 7 x weekly - 1-2 sets - 10 reps - 5 hold - Standing Circular Shoulder Pendulum Supported with Arm Bent  - 2-3 x daily - 7 x weekly - 1-2 sets - 10 reps - Standing Flexion Extension Shoulder Pendulum Supported with Arm Bent  - 2-3 x daily - 7 x weekly - 1-2 sets - 10 reps - Standing Horizontal Shoulder Pendulum Supported with Arm Bent  - 2-3 x daily - 7 x weekly - 1-2 sets - 10 reps   ASSESSMENT:   CLINICAL IMPRESSION: Pt returned to clinic today after several weeks out of clinic from physical therapy.  Has been attending OT visits for hand injury.  Update today showed some improvement overall in tolerated mobility.  Due to time since surgery, able to assess AROM today and begin some transitioning towards muscle activation.  Adjusted HEP accordingly.  Continued skilled PT services indicated at this time.      OBJECTIVE IMPAIRMENTS decreased activity tolerance, decreased coordination, decreased endurance, decreased mobility, decreased ROM, decreased strength, hypomobility, increased edema, increased fascial restrictions, impaired perceived functional ability, impaired flexibility, impaired UE functional use, improper body mechanics, and pain.    ACTIVITY LIMITATIONS carrying, lifting, sleeping, dressing, and reach over head   PARTICIPATION LIMITATIONS: meal prep, cleaning, laundry, interpersonal relationship, driving, shopping, community activity, occupation, and exercises    PERSONAL FACTORS Past/current experiences (previous Rt shoulder rotator cuff surgery reported) are also affecting patient's functional outcome.    REHAB POTENTIAL: Good   CLINICAL DECISION MAKING: Stable/uncomplicated   EVALUATION COMPLEXITY: Low     GOALS: Goals reviewed with patient? Yes   Short term PT Goals (target date for Short term goals are 3 weeks (11/13/2021) Patient will demonstrate independent use of home exercise program to maintain progress from in clinic treatments. Goal status: partially met - assessed 11/30/2021(upon return to clinic)   Long term PT goals (target dates for all long term goals are 10 weeks  01/01/2022 )   1. Patient will demonstrate/report pain at worst less than or equal to 2/10 to facilitate minimal limitation in daily activity secondary to pain symptoms. Goal status: on going - assessed 11/30/2021   2. Patient will demonstrate independent use of home exercise program to facilitate ability to maintain/progress functional gains from skilled physical therapy services. Goal status:  on going - assessed 11/30/2021   3. Patient will demonstrate FOTO outcome > or = 64 % to indicate reduced disability due to condition. Goal status:  on going - assessed 11/30/2021   4.  Patient will demonstrate Rt [place] joint mobility WFL to facilitate usual self care, dressing, reaching overhead at PLOF s limitation due to symptoms.  Goal status:  on going - assessed 11/30/2021   5.  Patient will demonstrate Rt UE MMT 5/5 throughout to facilitate usual  lifting, carrying in functional activity to PLOF s limitation.   Goal status:  on going - assessed 11/30/2021   6.  Patient will demonstrate/report ability to return to work at [employer] s restriction.   Goal status:  on going - assessed 11/30/2021   7.  Patient will indicate ability to sleep in bed unrestricted due to symptoms.  a.  Goal Status:  on going - assessed 11/30/2021     PLAN: PT FREQUENCY: 2x/week then transitioning to  1-2x/week as able to preserve visit number for length of POC   PT DURATION: 10 weeks   PLANNED INTERVENTIONS: Therapeutic exercises, Therapeutic activity, Neuro Muscular re-education, Balance training, Gait training, Patient/Family education, Joint mobilization, Stair training, DME instructions, Dry Needling, Electrical stimulation, Cryotherapy, Moist heat, Taping, Ultrasound, Ionotophoresis 73m/ml Dexamethasone, and Manual therapy.  All included unless contraindicated   PLAN FOR NEXT SESSION: Progression as tolerated c focus on end range mobility improvements (abduction, ER focus) and active range improvements in gravity reduced positioning.   MScot Jun PT, DPT, OCS, ATC 11/30/21  9:34 AM

## 2021-12-01 ENCOUNTER — Ambulatory Visit: Payer: 59 | Admitting: Orthopaedic Surgery

## 2021-12-01 ENCOUNTER — Ambulatory Visit (INDEPENDENT_AMBULATORY_CARE_PROVIDER_SITE_OTHER): Payer: 59 | Admitting: Orthopaedic Surgery

## 2021-12-01 DIAGNOSIS — M67813 Other specified disorders of tendon, right shoulder: Secondary | ICD-10-CM

## 2021-12-01 NOTE — Progress Notes (Signed)
Office Visit Note   Patient: Darin Hart           Date of Birth: [birth year]           MRN: [id number] Visit Date: 12/01/2021              Requested by: Georgina Quint, MD [address] [place],  [place] [address] PCP: Georgina Quint, MD   Assessment & Plan: Visit Diagnoses:  1. Biceps tendonosis of right shoulder     Plan: Mr. Riolo returns today for follow-up of his right shoulder pain.  Reports chronic pain at night as well.  Has not been able to work since the surgery on 09/30/2021.  Feels constant pain and soreness.  Has been very limited in his activities.  Examination of right shoulder shows fully healed surgical scar.  He is moderately restricted in flexion external rotation and abduction.  Cuff strength is grossly intact.  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  We will order an intra-articular ultrasound-guided cortisone injection for the right shoulder.  Will update his prescription for PT.  He needs to remain out of work until his hernia surgery.  I will need to recheck his shoulder range of motion and function in 6 weeks.  Follow-Up Instructions: Return in about 6 weeks (around 01/12/2022).   Orders:  Orders Placed This Encounter  Procedures   Ambulatory referral to Physical Therapy   No orders of the defined types were placed in this encounter.     Procedures: No procedures performed   Clinical Data: No additional findings.   Subjective: Chief Complaint  Patient presents with   Right Shoulder - Pain    HPI  Review of Systems   Objective: Vital Signs: There were no vitals taken for this visit.  Physical Exam  Ortho Exam  Specialty Comments:  No specialty comments available.  Imaging: No results found.   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   Traumatic rupture of right biceps tendon 09/30/2021    Low back pain 01/23/2021   Chronic musculoskeletal pain 10/02/2020   History of asthma 10/02/2020   History of multiple allergies 10/02/2020   Low testosterone 02/07/2018   Screening for prostate cancer 11/28/2017   Past Medical History:  Diagnosis Date   Arthritis    knees   Asthma    Biceps rupture, proximal    right   Heart murmur    as child    Family History  Problem Relation Age of Onset   Hypertension Mother    Other Neg Hx        hypogonadism    Past Surgical History:  Procedure Laterality Date   BICEPT TENODESIS Right 09/30/2021   Procedure: RIGHT BICEPS TENODESIS;  Surgeon: Tarry Kos, MD;  [facility];  Service: Orthopedics;  Laterality: Right;   HERNIA REPAIR     ORIF FINGER / THUMB FRACTURE Right    RHINOPLASTY     Spontaneous pneumothorax Left    20 yrs ago   Social History   Occupational History   Not on file  Tobacco Use   Smoking status: Never   Smokeless tobacco: Never  Vaping Use   Vaping Use: Never used  Substance and Sexual Activity   Alcohol use: Yes    Comment: OCCASIONALLY   Drug use: Never   Sexual activity: Yes  Partners: Female

## 2021-12-01 NOTE — Addendum Note (Signed)
Addended by: Wendi Maya on: 12/01/2021 04:29 PM   Modules accepted: Orders

## 2021-12-02 ENCOUNTER — Ambulatory Visit (INDEPENDENT_AMBULATORY_CARE_PROVIDER_SITE_OTHER): Payer: 59 | Admitting: Rehabilitative and Restorative Service Providers"

## 2021-12-02 ENCOUNTER — Encounter: Payer: Self-pay | Admitting: Rehabilitative and Restorative Service Providers"

## 2021-12-02 DIAGNOSIS — M25611 Stiffness of right shoulder, not elsewhere classified: Secondary | ICD-10-CM

## 2021-12-02 DIAGNOSIS — R600 Localized edema: Secondary | ICD-10-CM | POA: Diagnosis not present

## 2021-12-02 DIAGNOSIS — M25511 Pain in right shoulder: Secondary | ICD-10-CM

## 2021-12-02 DIAGNOSIS — G8929 Other chronic pain: Secondary | ICD-10-CM

## 2021-12-02 DIAGNOSIS — M6281 Muscle weakness (generalized): Secondary | ICD-10-CM | POA: Diagnosis not present

## 2021-12-02 NOTE — Therapy (Signed)
OUTPATIENT PHYSICAL THERAPY TREATMENT NOTE   Patient Name: BEATRICE SEHGAL MRN: [id number] DOB:[birth year], 51 y.o., male Today's Date: 12/02/2021  PCP: Horald Pollen.   REFERRING PROVIDER: Aundra Dubin, PA-C  END OF SESSION:   PT End of Session - 12/02/21 0838     Visit Number 4    Number of Visits 20    Date for PT Re-Evaluation 01/01/22    Authorization Type [employer] $20 copay 20 PT visits per year    Progress Note Due on Visit 10    PT Start Time 0839    PT Stop Time 0930    PT Time Calculation (min) 51 min    Activity Tolerance Patient tolerated treatment well    Behavior During Therapy [facility] for tasks assessed/performed               Past Medical History:  Diagnosis Date   Arthritis    knees   Asthma    Biceps rupture, proximal    right   Heart murmur    as child   Past Surgical History:  Procedure Laterality Date   BICEPT TENODESIS Right 09/30/2021   Procedure: RIGHT BICEPS TENODESIS;  Surgeon: Leandrew Koyanagi, MD;  Location: [place];  Service: Orthopedics;  Laterality: Right;   HERNIA REPAIR     ORIF FINGER / THUMB FRACTURE Right    RHINOPLASTY     Spontaneous pneumothorax Left    20 yrs ago   Patient Active Problem List   Diagnosis Date Noted   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   Traumatic rupture of right biceps tendon 09/30/2021   Low back pain 01/23/2021   Chronic musculoskeletal pain 10/02/2020   History of asthma 10/02/2020   History of multiple allergies 10/02/2020   Low testosterone 02/07/2018   Screening for prostate cancer 11/28/2017    REFERRING DIAG: Y77.412 (ICD-10-CM) - Biceps tendonosis of right shoulder  ONSET DATE: 09/30/2021 Surgery Rt biceps Tenodesis  THERAPY DIAG:  Chronic right shoulder pain  Stiffness of right shoulder joint  Muscle weakness (generalized)  Localized edema  Rationale for Evaluation and Treatment Rehabilitation  PERTINENT HISTORY: History of Rt  shoulder rotator cuff repair (years ago per Pt)  PRECAUTIONS: Shoulder -No lifting greater than 2 lbs through the elbow, no strengthening 6 weeks (has progressed out of precautions at this time - 11/30/2021)  SUBJECTIVE:  Pt indicated pain was tolerable upon arrival today, 5/10 rated.  Pt indicated he was going to get injection in shoulder to help "frozen shoulder."  PAIN:  NPRS scale: Current 5/10 Pain location: Rt shoulder/arm Pain description: sharp, numbing pain Aggravating factors: constant pain overall, movement, nighttime pain Relieving factors: medicine, propping/wearing sling.   OBJECTIVE:    PATIENT SURVEYS:  10/23/2021: FOTO intake:  26  predicted:  64   COGNITION: 10/23/2021: Overall cognitive status: Within functional limits for tasks assessed                                     SENSATION: 10/23/2021: [facility]   EDEMA 10/23/2021: Localized edema to Rt shoulder noted visually.    POSTURE: 10/23/2021: High guard of Rt shoulder (no sling today)   UPPER EXTREMITY ROM:    ROM Right 10/23/2021 Left 10/23/2021 Right 11/02/2021 Right AROM (PROM)  Shoulder flexion PROM 66 deg in supine WFL PROM 90 deg in supine 118 (120) in supine  Shoulder extension  Shoulder abduction PROM 56 in supine WFL PROM 90 deg in supine 90(95) in supine  Shoulder adduction        Shoulder internal rotation PROM 45 deg in 30 deg abduction supine    50(50) in 30 deg abduction supine  Shoulder external rotation PROM 0 deg in 30 deg abduction supine    25(25) in 30 deg abduction supine  Elbow flexion        Elbow extension        Wrist flexion        Wrist extension        Wrist ulnar deviation        Wrist radial deviation        Wrist pronation        Wrist supination          10/23/2021:  All Rt shoulder PROM measured above primarily limited due to pain response.    (Blank rows = not tested)   UPPER EXTREMITY MMT:   MMT Right eval Left eval  Shoulder flexion 1+/5 5/5  Shoulder extension       Shoulder abduction 1+/5 5/5  Shoulder adduction      Shoulder internal rotation Not tested 5/5  Shoulder external rotation Not tested 5/5  Middle trapezius      Lower trapezius      Elbow flexion Not tested 5/5  Elbow extension Not tested 5/5  Wrist flexion      Wrist extension      Wrist ulnar deviation      Wrist radial deviation      Wrist pronation      Wrist supination      Grip strength (lbs)      (Blank rows = not tested)   JOINT MOBILITY TESTING:              10/23/2021: Normal Rt GH joint mobility in mid range motion in inferior direction.  Mild limit in posterior direction.    PALPATION:  10/23/2021: Tenderness noted in Rt shoulder/arm anterior shoulder jt, axillary aspect of upper arm, anterior/middle deltoid.               TODAY'S TREATMENT:  12/02/2021:   Therex:    UBE fwd/rev 3 mins each way Lvl 1.0   Pulleys flexion,scaption 3 mins each way 5 sec holds short lever arm for symptoms (advised for home use)   Supine AAROM 1 lb x 20   Supine AROM x 15 full range   Sidelying Rt shoulder ER c towel at side 2 x 10      Manual:  g4 inferior jot mobs Rt GH joint, posterior g3 mobs, mobilization c movement for ER gains  Vasopneumatic  Rt shoulder medium compression 34 degrees 10 mins  11/30/2021: Therex:            HEP progression and update c cues for techniques throughout  Supine wand AAROM 1 lb bar x 10  Supine AROM flexion x 10   Standing wand abduction AAROM 1 lb bar x 10   Standing doorway isometric painfree flexion, abdcution, ER Rt shoulder 5 sec x 10 each way   11/02/2021: Therex:             Supine PROM flexion c Lt arm moving Rt 2-3 sec hold x 10  Supine AAROM 1 lb bar flexion 2-3 sec hold x 10    Review of existing HEP.    Manual;  G2-g3 inferior joint mobs Rt shoulder in mid to end range for mobility gains/symptom relief  Vasopneumatic  Rt shoulder medium compression 34 degrees 10 mins   PATIENT EDUCATION: 11/30/2021: Education  details: HEP, POC Person educated: Patient Education method: Consulting civil engineer, Media planner, Verbal cues, and Handouts Education comprehension: verbalized understanding, returned demonstration, and verbal cues required     HOME EXERCISE PROGRAM: Access Code: [email or web address] URL: [email or web address] Date: 10/23/2021 Prepared by: Scot Jun   Exercises - Seated Scapular Retraction  - 3-5 x daily - 7 x weekly - 1 sets - 10 reps - 3-5 hold - Supine Shoulder Flexion PROM (Mirrored)  - 2-3 x daily - 7 x weekly - 1-2 sets - 10 reps - 5 hold - Supine Shoulder External Rotation with Dowel (Mirrored)  - 2-3 x daily - 7 x weekly - 1-2 sets - 10 reps - 5 hold - Standing Circular Shoulder Pendulum Supported with Arm Bent  - 2-3 x daily - 7 x weekly - 1-2 sets - 10 reps - Standing Flexion Extension Shoulder Pendulum Supported with Arm Bent  - 2-3 x daily - 7 x weekly - 1-2 sets - 10 reps - Standing Horizontal Shoulder Pendulum Supported with Arm Bent  - 2-3 x daily - 7 x weekly - 1-2 sets - 10 reps   ASSESSMENT:   CLINICAL IMPRESSION: Mixture of capsular tightness and myofascial limitations/pain for movement.  Progressing in tolerance today compared to previous.  Encouraged continued routine HEP use and in clinic time to progress mobility.       OBJECTIVE IMPAIRMENTS decreased activity tolerance, decreased coordination, decreased endurance, decreased mobility, decreased ROM, decreased strength, hypomobility, increased edema, increased fascial restrictions, impaired perceived functional ability, impaired flexibility, impaired UE functional use, improper body mechanics, and pain.    ACTIVITY LIMITATIONS carrying, lifting, sleeping, dressing, and reach over head   PARTICIPATION LIMITATIONS: meal prep, cleaning, laundry, interpersonal relationship, driving, shopping, community activity, occupation, and exercises   PERSONAL FACTORS Past/current experiences (previous Rt shoulder rotator cuff  surgery reported) are also affecting patient's functional outcome.    REHAB POTENTIAL: Good   CLINICAL DECISION MAKING: Stable/uncomplicated   EVALUATION COMPLEXITY: Low     GOALS: Goals reviewed with patient? Yes   Short term PT Goals (target date for Short term goals are 3 weeks (11/13/2021) Patient will demonstrate independent use of home exercise program to maintain progress from in clinic treatments. Goal status: partially met - assessed 11/30/2021(upon return to clinic)   Long term PT goals (target dates for all long term goals are 10 weeks  01/01/2022 )   1. Patient will demonstrate/report pain at worst less than or equal to 2/10 to facilitate minimal limitation in daily activity secondary to pain symptoms. Goal status: on going - assessed 11/30/2021   2. Patient will demonstrate independent use of home exercise program to facilitate ability to maintain/progress functional gains from skilled physical therapy services. Goal status:  on going - assessed 11/30/2021   3. Patient will demonstrate FOTO outcome > or = 64 % to indicate reduced disability due to condition. Goal status:  on going - assessed 11/30/2021   4.  Patient will demonstrate Rt Superior joint mobility WFL to facilitate usual self care, dressing, reaching overhead at PLOF s limitation due to symptoms.  Goal status:  on going - assessed 11/30/2021   5.  Patient will demonstrate Rt UE MMT 5/5 throughout to facilitate usual lifting, carrying in functional activity to PLOF s limitation.   Goal status:  on going -  assessed 11/30/2021   6.  Patient will demonstrate/report ability to return to work at [employer] s restriction.   Goal status:  on going - assessed 11/30/2021   7.  Patient will indicate ability to sleep in bed unrestricted due to symptoms.  a.  Goal Status:  on going - assessed 11/30/2021     PLAN: PT FREQUENCY: 2x/week then transitioning to 1-2x/week as able to preserve visit number for length of POC   PT DURATION: 10  weeks   PLANNED INTERVENTIONS: Therapeutic exercises, Therapeutic activity, Neuro Muscular re-education, Balance training, Gait training, Patient/Family education, Joint mobilization, Stair training, DME instructions, Dry Needling, Electrical stimulation, Cryotherapy, Moist heat, Taping, Ultrasound, Ionotophoresis 55m/ml Dexamethasone, and Manual therapy.  All included unless contraindicated   PLAN FOR NEXT SESSION: Manual and therex for mobility gains, early AAROM/AROM  MScot Jun PT, DPT, OCS, ATC 12/02/21  9:25 AM

## 2021-12-04 NOTE — Progress Notes (Signed)
    Subjective:    CC: R shoulder pain  I, Jadier Rockers, LAT, ATC, am serving as scribe for Dr. Clementeen Graham.  HPI: Pt is a 51 y/o male presenting w/ c/o R shoulder pain due to likely adhesive capsulitis s/p R biceps tenodesis by Dr. Roda Shutters on 09/30/21.  He locates his pain to .  He is currently attending PT and has completed 4 visits.  Radiating pain: R shoulder mechanical symptoms: Aggravating factors: Treatments tried: currently doing PT (4 visits);   Diagnostic testing: R shoulder MRI- 07/22/21; R shoulder XR- 06/28/21  Pertinent review of Systems: ***  Relevant historical information: ***   Objective:   There were no vitals filed for this visit. General: Well Developed, well nourished, and in no acute distress.   MSK: ***  Lab and Radiology Results No results found for this or any previous visit (from the past 72 hour(s)). No results found.    Impression and Recommendations:    Assessment and Plan: 51 y.o. male with ***.  PDMP not reviewed this encounter. No orders of the defined types were placed in this encounter.  No orders of the defined types were placed in this encounter.   Discussed warning signs or symptoms. Please see discharge instructions. Patient expresses understanding.   ***

## 2021-12-07 ENCOUNTER — Telehealth: Payer: Self-pay | Admitting: Orthopaedic Surgery

## 2021-12-07 ENCOUNTER — Ambulatory Visit: Payer: Self-pay

## 2021-12-07 ENCOUNTER — Ambulatory Visit (INDEPENDENT_AMBULATORY_CARE_PROVIDER_SITE_OTHER): Payer: 59 | Admitting: Family Medicine

## 2021-12-07 VITALS — BP 122/84 | HR 63 | Ht 72.0 in | Wt 177.0 lb

## 2021-12-07 DIAGNOSIS — M7501 Adhesive capsulitis of right shoulder: Secondary | ICD-10-CM

## 2021-12-07 DIAGNOSIS — G8929 Other chronic pain: Secondary | ICD-10-CM | POA: Diagnosis not present

## 2021-12-07 DIAGNOSIS — M25511 Pain in right shoulder: Secondary | ICD-10-CM | POA: Diagnosis not present

## 2021-12-07 NOTE — Telephone Encounter (Signed)
Received medical records release form and disability paperwork from patient/ Forwarding to [facility] today

## 2021-12-07 NOTE — Patient Instructions (Addendum)
Thank you for coming in today.   You received an injection today. Seek immediate medical attention if the joint becomes red, extremely painful, or is oozing fluid.   Continue physical therapy  Check back with me as needed

## 2021-12-08 ENCOUNTER — Ambulatory Visit (INDEPENDENT_AMBULATORY_CARE_PROVIDER_SITE_OTHER): Payer: 59 | Admitting: Orthopaedic Surgery

## 2021-12-08 ENCOUNTER — Encounter: Payer: Self-pay | Admitting: Orthopaedic Surgery

## 2021-12-08 DIAGNOSIS — M67813 Other specified disorders of tendon, right shoulder: Secondary | ICD-10-CM | POA: Insufficient documentation

## 2021-12-08 DIAGNOSIS — S46119A Strain of muscle, fascia and tendon of long head of biceps, unspecified arm, initial encounter: Secondary | ICD-10-CM | POA: Insufficient documentation

## 2021-12-08 DIAGNOSIS — S46211A Strain of muscle, fascia and tendon of other parts of biceps, right arm, initial encounter: Secondary | ICD-10-CM

## 2021-12-08 NOTE — Progress Notes (Signed)
   Post-Op Visit Note   Patient: Darin Hart           Date of Birth: 29-Nov-1970           MRN: [id number] Visit Date: 12/08/2021 PCP: Georgina Quint, MD   Assessment & Plan:  Chief Complaint:  Chief Complaint  Patient presents with   Right Shoulder - Follow-up   Visit Diagnoses:  1. Biceps rupture, proximal, right, initial encounter   2. Biceps tendonosis of right shoulder     Plan: Darin Hart is here for previously scheduled follow-up appointment for right proximal biceps tenodesis.  Underwent cortisone injection Dr. Zollie Pee office yesterday for adhesive capsulitis.  No real complaints today.  Examination of right shoulder girdle is unchanged.  At this time he will continue to do physical therapy to rehab the adhesive capsulitis.  I will place a restriction of no lifting more than 2 pounds with the right upper extremity for at least 6 weeks.  He will follow-up with me at that time for reevaluation.  Follow-Up Instructions: Return in about 6 weeks (around 01/19/2022).   Orders:  No orders of the defined types were placed in this encounter.  No orders of the defined types were placed in this encounter.   Imaging: No results found.  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   Traumatic rupture of right biceps tendon 09/30/2021   Low back pain 01/23/2021   Chronic musculoskeletal pain 10/02/2020   History of asthma 10/02/2020   History of multiple allergies 10/02/2020   Low testosterone 02/07/2018   Screening for prostate cancer 11/28/2017   Past Medical History:  Diagnosis Date   Arthritis    knees   Asthma    Biceps rupture, proximal    right   Heart murmur    as child    Family History  Problem Relation Age of Onset   Hypertension Mother    Other  Neg Hx        hypogonadism    Past Surgical History:  Procedure Laterality Date   BICEPT TENODESIS Right 09/30/2021   Procedure: RIGHT BICEPS TENODESIS;  Surgeon: Tarry Kos, MD;  Location: [facility];  Service: Orthopedics;  Laterality: Right;   HERNIA REPAIR     ORIF FINGER / THUMB FRACTURE Right    RHINOPLASTY     Spontaneous pneumothorax Left    20 yrs ago   Social History   Occupational History   Not on file  Tobacco Use   Smoking status: Never   Smokeless tobacco: Never  Vaping Use   Vaping Use: Never used  Substance and Sexual Activity   Alcohol use: Yes    Comment: OCCASIONALLY   Drug use: Never   Sexual activity: Yes    Partners: Female

## 2021-12-09 ENCOUNTER — Ambulatory Visit (INDEPENDENT_AMBULATORY_CARE_PROVIDER_SITE_OTHER): Payer: 59 | Admitting: Rehabilitative and Restorative Service Providers"

## 2021-12-09 ENCOUNTER — Encounter: Payer: Self-pay | Admitting: Rehabilitative and Restorative Service Providers"

## 2021-12-09 DIAGNOSIS — M25511 Pain in right shoulder: Secondary | ICD-10-CM

## 2021-12-09 DIAGNOSIS — M6281 Muscle weakness (generalized): Secondary | ICD-10-CM

## 2021-12-09 DIAGNOSIS — M25611 Stiffness of right shoulder, not elsewhere classified: Secondary | ICD-10-CM | POA: Diagnosis not present

## 2021-12-09 DIAGNOSIS — R600 Localized edema: Secondary | ICD-10-CM | POA: Diagnosis not present

## 2021-12-09 DIAGNOSIS — G8929 Other chronic pain: Secondary | ICD-10-CM

## 2021-12-09 NOTE — Therapy (Signed)
OUTPATIENT PHYSICAL THERAPY TREATMENT NOTE   Patient Name: Darin Hart MRN: [id number] DOB:1970/10/12, 51 y.o., male Today's Date: 12/09/2021  PCP: Horald Pollen.   REFERRING PROVIDER: Aundra Dubin, PA-C  END OF SESSION:   PT End of Session - 12/09/21 1146     Visit Number 5    Number of Visits 20    Date for PT Re-Evaluation 01/01/22    Authorization Type [employer] $20 copay 20 PT visits per year    Authorization - Visit Number 5    Progress Note Due on Visit 10    PT Start Time 7846    PT Stop Time 1220    PT Time Calculation (min) 39 min    Activity Tolerance Patient tolerated treatment well    Behavior During Therapy WFL for tasks assessed/performed                Past Medical History:  Diagnosis Date   Arthritis    knees   Asthma    Biceps rupture, proximal    right   Heart murmur    as child   Past Surgical History:  Procedure Laterality Date   BICEPT TENODESIS Right 09/30/2021   Procedure: RIGHT BICEPS TENODESIS;  Surgeon: Leandrew Koyanagi, MD;  Location: [place];  Service: Orthopedics;  Laterality: Right;   HERNIA REPAIR     ORIF FINGER / THUMB FRACTURE Right    RHINOPLASTY     Spontaneous pneumothorax Left    20 yrs ago   Patient Active Problem List   Diagnosis Date Noted   Biceps rupture, proximal, right, initial encounter 12/08/2021   Biceps tendonosis of right shoulder 12/08/2021   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   Traumatic rupture of right biceps tendon 09/30/2021   Low back pain 01/23/2021   Chronic musculoskeletal pain 10/02/2020   History of asthma 10/02/2020   History of multiple allergies 10/02/2020   Low testosterone 02/07/2018   Screening for prostate cancer 11/28/2017    REFERRING DIAG: N62.952 (ICD-10-CM) - Biceps tendonosis of right shoulder  ONSET DATE: 09/30/2021 Surgery Rt biceps Tenodesis  THERAPY DIAG:  Chronic right shoulder pain  Stiffness of right shoulder  joint  Muscle weakness (generalized)  Localized edema  Rationale for Evaluation and Treatment Rehabilitation  PERTINENT HISTORY: History of Rt shoulder rotator cuff repair (years ago per Pt)  PRECAUTIONS: Shoulder -No lifting greater than 2 lbs through the elbow for 6 weeks from MD visit on 12/08/2021  SUBJECTIVE:  Pt indicated having injection in shoulder since last visit.  Pt indicated he didn't do exercises that day to rest and then tried some the next day.  Some mild improvement in symptoms noted since injection.  Saw surgeon   PAIN:  NPRS scale: Current 3/10 Pain location: Rt shoulder/arm Pain description: sharp, numbing pain Aggravating factors: constant pain overall, movement, nighttime pain Relieving factors: medicine, propping/wearing sling.   OBJECTIVE:    PATIENT SURVEYS:  10/23/2021: FOTO intake:  26  predicted:  64   COGNITION: 10/23/2021: Overall cognitive status: Within functional limits for tasks assessed                                     SENSATION: 10/23/2021: [facility]   EDEMA 10/23/2021: Localized edema to Rt shoulder noted visually.    POSTURE: 10/23/2021: High guard of Rt shoulder (no sling today)   UPPER EXTREMITY ROM:  ROM Right 10/23/2021 Left 10/23/2021 Right 11/02/2021 Right AROM (PROM) 11/30/2021 Right AROM 12/09/2021  Shoulder flexion PROM 66 deg in supine WFL PROM 90 deg in supine 118 (120) in supine 135 in supine  Shoulder extension         Shoulder abduction PROM 56 in supine WFL PROM 90 deg in supine 90(95) in supine 128 in supine  Shoulder adduction         Shoulder internal rotation PROM 45 deg in 30 deg abduction supine    50(50) in 30 deg abduction supine   Shoulder external rotation PROM 0 deg in 30 deg abduction supine    25(25) in 30 deg abduction supine 51 in 45 deg abduction in supine  Elbow flexion         Elbow extension         Wrist flexion         Wrist extension         Wrist ulnar deviation         Wrist radial deviation          Wrist pronation         Wrist supination           10/23/2021:  All Rt shoulder PROM measured above primarily limited due to pain response.    (Blank rows = not tested)   UPPER EXTREMITY MMT:   MMT Right eval Left eval  Shoulder flexion 1+/5 5/5  Shoulder extension      Shoulder abduction 1+/5 5/5  Shoulder adduction      Shoulder internal rotation Not tested 5/5  Shoulder external rotation Not tested 5/5  Middle trapezius      Lower trapezius      Elbow flexion Not tested 5/5  Elbow extension Not tested 5/5  Wrist flexion      Wrist extension      Wrist ulnar deviation      Wrist radial deviation      Wrist pronation      Wrist supination      Grip strength (lbs)      (Blank rows = not tested)   JOINT MOBILITY TESTING:              10/23/2021: Normal Rt GH joint mobility in mid range motion in inferior direction.  Mild limit in posterior direction.    PALPATION:  10/23/2021: Tenderness noted in Rt shoulder/arm anterior shoulder jt, axillary aspect of upper arm, anterior/middle deltoid.               TODAY'S TREATMENT:  12/09/2021:   Therex:    UBE fwd/rev 4 mins each way Lvl 2.5   Pulleys flexion,scaption 3 mins each way 5 sec holds short lever arm for symptoms    Wall slides flexion 10 x 2-3 sec holds   Standing tband rows blue 20x c scap retraction   Standing tband GH ext blue 20 x       Sidelying Rt shoulder ER c towel at side 2 x 10    Sidelying Rt shoulder abduction 2 x 10 1 lb   Sidelying Rt shoulder flexion 2 x 10 1 lb     12/02/2021:   Therex:    UBE fwd/rev 3 mins each way Lvl 1.0   Pulleys flexion,scaption 3 mins each way 5 sec holds short lever arm for symptoms (advised for home use)   Supine AAROM 1 lb x 20   Supine AROM x 15 full range   Sidelying  Rt shoulder ER c towel at side 2 x 10      Manual:  g4 inferior jot mobs Rt GH joint, posterior g3 mobs, mobilization c movement for ER gains  Vasopneumatic  Rt shoulder medium compression 34 degrees 10  mins  11/30/2021: Therex:            HEP progression and update c cues for techniques throughout  Supine wand AAROM 1 lb bar x 10  Supine AROM flexion x 10   Standing wand abduction AAROM 1 lb bar x 10   Standing doorway isometric painfree flexion, abdcution, ER Rt shoulder 5 sec x 10 each way   11/02/2021: Therex:             Supine PROM flexion c Lt arm moving Rt 2-3 sec hold x 10  Supine AAROM 1 lb bar flexion 2-3 sec hold x 10    Review of existing HEP.    Manual;             G2-g3 inferior joint mobs Rt shoulder in mid to end range for mobility gains/symptom relief  Vasopneumatic  Rt shoulder medium compression 34 degrees 10 mins   PATIENT EDUCATION: 11/30/2021: Education details: HEP, POC Person educated: Patient Education method: Consulting civil engineer, Media planner, Verbal cues, and Handouts Education comprehension: verbalized understanding, returned demonstration, and verbal cues required     HOME EXERCISE PROGRAM: Access Code: [email or web address] URL: [email or web address] Date: 10/23/2021 Prepared by: Scot Jun   Exercises - Seated Scapular Retraction  - 3-5 x daily - 7 x weekly - 1 sets - 10 reps - 3-5 hold - Supine Shoulder Flexion PROM (Mirrored)  - 2-3 x daily - 7 x weekly - 1-2 sets - 10 reps - 5 hold - Supine Shoulder External Rotation with Dowel (Mirrored)  - 2-3 x daily - 7 x weekly - 1-2 sets - 10 reps - 5 hold - Standing Circular Shoulder Pendulum Supported with Arm Bent  - 2-3 x daily - 7 x weekly - 1-2 sets - 10 reps - Standing Flexion Extension Shoulder Pendulum Supported with Arm Bent  - 2-3 x daily - 7 x weekly - 1-2 sets - 10 reps - Standing Horizontal Shoulder Pendulum Supported with Arm Bent  - 2-3 x daily - 7 x weekly - 1-2 sets - 10 reps   ASSESSMENT:   CLINICAL IMPRESSION: Continued education and encouragement of benefit of HEP and consistent use to promote improvement in general Rt shoulder mobility.  Able to progress in measured active  range in supine as compared to previous.  Continued use of early progressive muscle activation c AROM focus primarily c minimal to no weight.      OBJECTIVE IMPAIRMENTS decreased activity tolerance, decreased coordination, decreased endurance, decreased mobility, decreased ROM, decreased strength, hypomobility, increased edema, increased fascial restrictions, impaired perceived functional ability, impaired flexibility, impaired UE functional use, improper body mechanics, and pain.    ACTIVITY LIMITATIONS carrying, lifting, sleeping, dressing, and reach over head   PARTICIPATION LIMITATIONS: meal prep, cleaning, laundry, interpersonal relationship, driving, shopping, community activity, occupation, and exercises   PERSONAL FACTORS Past/current experiences (previous Rt shoulder rotator cuff surgery reported) are also affecting patient's functional outcome.    REHAB POTENTIAL: Good   CLINICAL DECISION MAKING: Stable/uncomplicated   EVALUATION COMPLEXITY: Low     GOALS: Goals reviewed with patient? Yes   Short term PT Goals (target date for Short term goals are 3 weeks (11/13/2021) Patient will demonstrate independent use of home exercise program to maintain progress  from in clinic treatments. Goal status: partially met - assessed 11/30/2021(upon return to clinic)   Long term PT goals (target dates for all long term goals are 10 weeks  01/01/2022 )   1. Patient will demonstrate/report pain at worst less than or equal to 2/10 to facilitate minimal limitation in daily activity secondary to pain symptoms. Goal status: on going - assessed 11/30/2021   2. Patient will demonstrate independent use of home exercise program to facilitate ability to maintain/progress functional gains from skilled physical therapy services. Goal status:  on going - assessed 11/30/2021   3. Patient will demonstrate FOTO outcome > or = 64 % to indicate reduced disability due to condition. Goal status:  on going -  assessed 11/30/2021   4.  Patient will demonstrate Rt [place] joint mobility WFL to facilitate usual self care, dressing, reaching overhead at PLOF s limitation due to symptoms.  Goal status:  on going - assessed 11/30/2021   5.  Patient will demonstrate Rt UE MMT 5/5 throughout to facilitate usual lifting, carrying in functional activity to PLOF s limitation.   Goal status:  on going - assessed 11/30/2021   6.  Patient will demonstrate/report ability to return to work at [employer] s restriction.   Goal status:  on going - assessed 11/30/2021   7.  Patient will indicate ability to sleep in bed unrestricted due to symptoms.  a.  Goal Status:  on going - assessed 11/30/2021     PLAN: PT FREQUENCY: 2x/week then transitioning to 1-2x/week as able to preserve visit number for length of POC   PT DURATION: 10 weeks   PLANNED INTERVENTIONS: Therapeutic exercises, Therapeutic activity, Neuro Muscular re-education, Balance training, Gait training, Patient/Family education, Joint mobilization, Stair training, DME instructions, Dry Needling, Electrical stimulation, Cryotherapy, Moist heat, Taping, Ultrasound, Ionotophoresis 61m/ml Dexamethasone, and Manual therapy.  All included unless contraindicated   PLAN FOR NEXT SESSION: End range mobility improvements, active range   MScot Jun PT, DPT, OCS, ATC 12/09/21  12:19 PM

## 2021-12-11 ENCOUNTER — Encounter: Payer: 59 | Admitting: Rehabilitative and Restorative Service Providers"

## 2021-12-15 ENCOUNTER — Ambulatory Visit (INDEPENDENT_AMBULATORY_CARE_PROVIDER_SITE_OTHER): Payer: 59 | Admitting: Rehabilitative and Restorative Service Providers"

## 2021-12-15 ENCOUNTER — Encounter: Payer: Self-pay | Admitting: Rehabilitative and Restorative Service Providers"

## 2021-12-15 DIAGNOSIS — M6281 Muscle weakness (generalized): Secondary | ICD-10-CM

## 2021-12-15 DIAGNOSIS — M25511 Pain in right shoulder: Secondary | ICD-10-CM | POA: Diagnosis not present

## 2021-12-15 DIAGNOSIS — M25611 Stiffness of right shoulder, not elsewhere classified: Secondary | ICD-10-CM

## 2021-12-15 DIAGNOSIS — R600 Localized edema: Secondary | ICD-10-CM

## 2021-12-15 DIAGNOSIS — G8929 Other chronic pain: Secondary | ICD-10-CM

## 2021-12-15 NOTE — Therapy (Signed)
OUTPATIENT PHYSICAL THERAPY TREATMENT NOTE   Patient Name: Darin Hart MRN: [id number] DOB:Mar 30, 1971, 51 y.o., male Today's Date: 12/15/2021  PCP: Horald Pollen.   REFERRING PROVIDER: Aundra Dubin, PA-C  END OF SESSION:   PT End of Session - 12/15/21 0921     Visit Number 6    Number of Visits 20    Date for PT Re-Evaluation 01/01/22    Authorization Type [employer] $20 copay 20 PT visits per year    Authorization - Visit Number 6    Progress Note Due on Visit 10    PT Start Time 0922    PT Stop Time 1002    PT Time Calculation (min) 40 min    Activity Tolerance Patient tolerated treatment well    Behavior During Therapy WFL for tasks assessed/performed                 Past Medical History:  Diagnosis Date   Arthritis    knees   Asthma    Biceps rupture, proximal    right   Heart murmur    as child   Past Surgical History:  Procedure Laterality Date   BICEPT TENODESIS Right 09/30/2021   Procedure: RIGHT BICEPS TENODESIS;  Surgeon: Leandrew Koyanagi, MD;  Location: 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   Traumatic rupture of right biceps tendon 09/30/2021   Low back pain 01/23/2021   Chronic musculoskeletal pain 10/02/2020   History of asthma 10/02/2020   History of multiple allergies 10/02/2020   Low testosterone 02/07/2018   Screening for prostate cancer 11/28/2017    REFERRING DIAG: G92.010 (ICD-10-CM) - Biceps tendonosis of right shoulder  ONSET DATE: 09/30/2021 Surgery Rt biceps Tenodesis  THERAPY DIAG:  Chronic right shoulder pain  Stiffness of right shoulder  joint  Muscle weakness (generalized)  Localized edema  Rationale for Evaluation and Treatment Rehabilitation  PERTINENT HISTORY: History of Rt shoulder rotator cuff repair (years ago per Pt)  PRECAUTIONS: Shoulder -No lifting greater than 2 lbs through the elbow for 6 weeks from MD visit on 12/08/2021  SUBJECTIVE:  Pt indicated he was general tired today (not specific to shoulder). Mentioned he did a little kayaking since last visit.  Pt indicated no specific pain coming into clinic today.  Reported last night wasn't as bad as a few days ago.   PAIN:  NPRS scale: 0/10 upon arrival.  Pain location: Rt shoulder/arm Pain description: sharp, numbing pain Aggravating factors: constant pain overall, movement, nighttime pain Relieving factors: medicine, propping/wearing sling.   OBJECTIVE:    PATIENT SURVEYS:  12/15/2021:  FOTO update  : 54  10/23/2021: FOTO intake:  26  predicted:  64   COGNITION: 10/23/2021: Overall cognitive status: Within functional limits for tasks assessed                                     SENSATION: 10/23/2021: [facility]   EDEMA 10/23/2021: Localized edema to Rt shoulder noted visually.    POSTURE:  10/23/2021: High guard of Rt shoulder (no sling today)   UPPER EXTREMITY ROM:    ROM Right 10/23/2021 Left 10/23/2021 Right 11/02/2021 Right AROM (PROM) 11/30/2021 Right AROM 12/09/2021  Shoulder flexion PROM 66 deg in supine WFL PROM 90 deg in supine 118 (120) in supine 135 in supine  Shoulder extension         Shoulder abduction PROM 56 in supine WFL PROM 90 deg in supine 90(95) in supine 128 in supine  Shoulder adduction         Shoulder internal rotation PROM 45 deg in 30 deg abduction supine    50(50) in 30 deg abduction supine   Shoulder external rotation PROM 0 deg in 30 deg abduction supine    25(25) in 30 deg abduction supine 51 in 45 deg abduction in supine  Elbow flexion         Elbow extension         Wrist flexion         Wrist extension         Wrist  ulnar deviation         Wrist radial deviation         Wrist pronation         Wrist supination           10/23/2021:  All Rt shoulder PROM measured above primarily limited due to pain response.    (Blank rows = not tested)   UPPER EXTREMITY MMT:   MMT Right eval Left eval Right 12/15/2021  Left 12/15/2021  Shoulder flexion 1+/5 5/5 3+/5 12.4, 13.4 lbs  5/5 43.4, 41 lbs  Shoulder extension        Shoulder abduction 1+/5 5/5 3+/5   Shoulder adduction        Shoulder internal rotation Not tested 5/5 5/5   Shoulder external rotation Not tested 5/5 4/5   Middle trapezius        Lower trapezius        Elbow flexion Not tested 5/5    Elbow extension Not tested 5/5    Wrist flexion        Wrist extension        Wrist ulnar deviation        Wrist radial deviation        Wrist pronation        Wrist supination        Grip strength (lbs)        (Blank rows = not tested)   JOINT MOBILITY TESTING:              10/23/2021: Normal Rt GH joint mobility in mid range motion in inferior direction.  Mild limit in posterior direction.    PALPATION:  10/23/2021: Tenderness noted in Rt shoulder/arm anterior shoulder jt, axillary aspect of upper arm, anterior/middle deltoid.               TODAY'S TREATMENT:  12/15/2021:   Therex:    Pulleys flexion,abduction 3 mins each way 5 sec holds short lever arm for symptoms    Standing green band IR c towel at side 2 x 15   Standing green band ER c towel at side, slow eccentric return focus 2 x 15    Standing bilateral GH flexion 1 lb x 15 , scaption x 15 (cues for slow lowering focus).  Movement 0-100 deg    Wall slides flexion 10 x 2-3 sec holds lift off    Wall push up c  serratus press hold 3 seconds 2 x 10     Prone scapular retraction 5 sec hold x 10   Prone scapular retraction c GH extension 5 sec hold x 10          12/09/2021:   Therex:    UBE fwd/rev 4 mins each way Lvl 2.5   Pulleys flexion,scaption 3 mins each way 5 sec holds short  lever arm for symptoms    Wall slides flexion 10 x 2-3 sec holds   Standing tband rows blue 20x c scap retraction   Standing tband GH ext blue 20 x       Sidelying Rt shoulder ER c towel at side 2 x 10    Sidelying Rt shoulder abduction 2 x 10 1 lb   Sidelying Rt shoulder flexion 2 x 10 1 lb     12/02/2021:   Therex:    UBE fwd/rev 3 mins each way Lvl 1.0   Pulleys flexion,scaption 3 mins each way 5 sec holds short lever arm for symptoms (advised for home use)   Supine AAROM 1 lb x 20   Supine AROM x 15 full range   Sidelying Rt shoulder ER c towel at side 2 x 10      Manual:  g4 inferior jot mobs Rt GH joint, posterior g3 mobs, mobilization c movement for ER gains  Vasopneumatic  Rt shoulder medium compression 34 degrees 10 mins   PATIENT EDUCATION: 11/30/2021: Education details: HEP, POC Person educated: Patient Education method: Consulting civil engineer, Media planner, Verbal cues, and Handouts Education comprehension: verbalized understanding, returned demonstration, and verbal cues required     HOME EXERCISE PROGRAM: Access Code: [email or web address] URL: [email or web address] Date: 10/23/2021 Prepared by: Scot Jun   Exercises - Seated Scapular Retraction  - 3-5 x daily - 7 x weekly - 1 sets - 10 reps - 3-5 hold - Supine Shoulder Flexion PROM (Mirrored)  - 2-3 x daily - 7 x weekly - 1-2 sets - 10 reps - 5 hold - Supine Shoulder External Rotation with Dowel (Mirrored)  - 2-3 x daily - 7 x weekly - 1-2 sets - 10 reps - 5 hold - Standing Circular Shoulder Pendulum Supported with Arm Bent  - 2-3 x daily - 7 x weekly - 1-2 sets - 10 reps - Standing Flexion Extension Shoulder Pendulum Supported with Arm Bent  - 2-3 x daily - 7 x weekly - 1-2 sets - 10 reps - Standing Horizontal Shoulder Pendulum Supported with Arm Bent  - 2-3 x daily - 7 x weekly - 1-2 sets - 10 reps   ASSESSMENT:   CLINICAL IMPRESSION: FOTO reassessment showed improvement compared to evaluation.  Strength  assessment formally today indicated noted strength deficit from Rt arm to Lt arm (as expected).  Continued skilled PT services indicated at this time.      OBJECTIVE IMPAIRMENTS decreased activity tolerance, decreased coordination, decreased endurance, decreased mobility, decreased ROM, decreased strength, hypomobility, increased edema, increased fascial restrictions, impaired perceived functional ability, impaired flexibility, impaired UE functional use, improper body mechanics, and pain.    ACTIVITY LIMITATIONS carrying, lifting, sleeping, dressing, and reach over head   PARTICIPATION LIMITATIONS: meal prep, cleaning, laundry, interpersonal relationship, driving, shopping, community activity, occupation, and exercises   PERSONAL FACTORS Past/current experiences (previous Rt shoulder rotator cuff surgery reported) are also affecting patient's functional outcome.    REHAB POTENTIAL: Good   CLINICAL DECISION MAKING: Stable/uncomplicated   EVALUATION COMPLEXITY: Low     GOALS: Goals reviewed with patient? Yes  Short term PT Goals (target date for Short term goals are 3 weeks (11/13/2021) Patient will demonstrate independent use of home exercise program to maintain progress from in clinic treatments. Goal status: partially met - assessed 11/30/2021(upon return to clinic)   Long term PT goals (target dates for all long term goals are 10 weeks  01/01/2022 )   1. Patient will demonstrate/report pain at worst less than or equal to 2/10 to facilitate minimal limitation in daily activity secondary to pain symptoms. Goal status: on going - assessed 11/30/2021   2. Patient will demonstrate independent use of home exercise program to facilitate ability to maintain/progress functional gains from skilled physical therapy services. Goal status:  on going - assessed 11/30/2021   3. Patient will demonstrate FOTO outcome > or = 64 % to indicate reduced disability due to condition. Goal status:  on going  - assessed 11/30/2021   4.  Patient will demonstrate Rt [place] joint mobility WFL to facilitate usual self care, dressing, reaching overhead at PLOF s limitation due to symptoms.  Goal status:  on going - assessed 11/30/2021   5.  Patient will demonstrate Rt UE MMT 5/5 throughout to facilitate usual lifting, carrying in functional activity to PLOF s limitation.   Goal status:  on going - assessed 11/30/2021   6.  Patient will demonstrate/report ability to return to work at [employer] s restriction.   Goal status:  on going - assessed 11/30/2021   7.  Patient will indicate ability to sleep in bed unrestricted due to symptoms.  a.  Goal Status:  on going - assessed 11/30/2021     PLAN: PT FREQUENCY: 2x/week then transitioning to 1-2x/week as able to preserve visit number for length of POC   PT DURATION: 10 weeks   PLANNED INTERVENTIONS: Therapeutic exercises, Therapeutic activity, Neuro Muscular re-education, Balance training, Gait training, Patient/Family education, Joint mobilization, Stair training, DME instructions, Dry Needling, Electrical stimulation, Cryotherapy, Moist heat, Taping, Ultrasound, Ionotophoresis 64m/ml Dexamethasone, and Manual therapy.  All included unless contraindicated   PLAN FOR NEXT SESSION: Continue improvement in functional mobility/strength progression.    MScot Jun PT, DPT, OCS, ATC 12/15/21  10:00 AM

## 2021-12-18 ENCOUNTER — Encounter: Payer: Self-pay | Admitting: Rehabilitative and Restorative Service Providers"

## 2021-12-18 ENCOUNTER — Ambulatory Visit (INDEPENDENT_AMBULATORY_CARE_PROVIDER_SITE_OTHER): Payer: 59 | Admitting: Rehabilitative and Restorative Service Providers"

## 2021-12-18 DIAGNOSIS — R600 Localized edema: Secondary | ICD-10-CM

## 2021-12-18 DIAGNOSIS — G8929 Other chronic pain: Secondary | ICD-10-CM

## 2021-12-18 DIAGNOSIS — M25611 Stiffness of right shoulder, not elsewhere classified: Secondary | ICD-10-CM | POA: Diagnosis not present

## 2021-12-18 DIAGNOSIS — M6281 Muscle weakness (generalized): Secondary | ICD-10-CM

## 2021-12-18 DIAGNOSIS — M25511 Pain in right shoulder: Secondary | ICD-10-CM | POA: Diagnosis not present

## 2021-12-18 NOTE — Therapy (Signed)
OUTPATIENT PHYSICAL THERAPY TREATMENT NOTE   Patient Name: Darin Hart MRN: [id number] DOB:11/26/1970, 51 y.o., male Today's Date: 12/18/2021  PCP: Horald Pollen.   REFERRING PROVIDER: Aundra Dubin, PA-C  END OF SESSION:   PT End of Session - 12/18/21 1027     Visit Number 7    Number of Visits 20    Date for PT Re-Evaluation 01/01/22    Authorization Type [employer] $20 copay 20 PT visits per year    Authorization - Visit Number 7    Progress Note Due on Visit 10    PT Start Time 1015    PT Stop Time 1054    PT Time Calculation (min) 39 min    Activity Tolerance Patient tolerated treatment well    Behavior During Therapy WFL for tasks assessed/performed             Past Medical History:  Diagnosis Date   Arthritis    knees   Asthma    Biceps rupture, proximal    right   Heart murmur    as child   Past Surgical History:  Procedure Laterality Date   BICEPT TENODESIS Right 09/30/2021   Procedure: RIGHT BICEPS TENODESIS;  Surgeon: Leandrew Koyanagi, MD;  Location: [place];  Service: Orthopedics;  Laterality: Right;   HERNIA REPAIR     ORIF FINGER / THUMB FRACTURE Right    RHINOPLASTY     Spontaneous pneumothorax Left    20 yrs ago   Patient Active Problem List   Diagnosis Date Noted   Biceps rupture, proximal, right, initial encounter 12/08/2021   Biceps tendonosis of right shoulder 12/08/2021   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   Traumatic rupture of right biceps tendon 09/30/2021   Low back pain 01/23/2021   Chronic musculoskeletal pain 10/02/2020   History of asthma 10/02/2020   History of multiple allergies 10/02/2020   Low testosterone 02/07/2018   Screening for prostate cancer 11/28/2017    REFERRING DIAG: X45.038 (ICD-10-CM) - Biceps tendonosis of right shoulder  ONSET DATE: 09/30/2021 Surgery Rt biceps Tenodesis  THERAPY DIAG:  Chronic right shoulder pain  Stiffness of right shoulder  joint  Muscle weakness (generalized)  Localized edema  Rationale for Evaluation and Treatment Rehabilitation  PERTINENT HISTORY: History of Rt shoulder rotator cuff repair (years ago per Pt)  PRECAUTIONS: Shoulder -No lifting greater than 2 lbs through the elbow for 6 weeks from MD visit on 12/08/2021  SUBJECTIVE:  He reports the shoulder is feeling well, with no increases in pain since last appointment.  PAIN:  NPRS scale: 0/10 upon arrival.  Pain location: Rt shoulder/arm Pain description: sharp, numbing pain Aggravating factors: constant pain overall, movement, nighttime pain Relieving factors: medicine, propping/wearing sling.   OBJECTIVE:    PATIENT SURVEYS:  12/15/2021:  FOTO update  : 54  10/23/2021: FOTO intake:  26  predicted:  64   COGNITION: 10/23/2021: Overall cognitive status: Within functional limits for tasks assessed                                     SENSATION: 10/23/2021: [facility]   EDEMA 10/23/2021: Localized edema to Rt shoulder noted visually.    POSTURE: 10/23/2021: High guard of Rt shoulder (no sling today)   UPPER EXTREMITY ROM:    ROM Right 10/23/2021 Left 10/23/2021 Right 11/02/2021 Right AROM (PROM) 11/30/2021 Right AROM 12/09/2021  Shoulder flexion PROM 66 deg in supine WFL PROM 90 deg in supine 118 (120) in supine 135 in supine  Shoulder extension         Shoulder abduction PROM 56 in supine WFL PROM 90 deg in supine 90(95) in supine 128 in supine  Shoulder adduction         Shoulder internal rotation PROM 45 deg in 30 deg abduction supine    50(50) in 30 deg abduction supine   Shoulder external rotation PROM 0 deg in 30 deg abduction supine    25(25) in 30 deg abduction supine 51 in 45 deg abduction in supine  Elbow flexion         Elbow extension         Wrist flexion         Wrist extension         Wrist ulnar deviation         Wrist radial deviation         Wrist pronation         Wrist supination           10/23/2021:  All Rt shoulder PROM  measured above primarily limited due to pain response.    (Blank rows = not tested)   UPPER EXTREMITY MMT:   MMT Right eval Left eval Right 12/15/2021  Left 12/15/2021  Shoulder flexion 1+/5 5/5 3+/5 12.4, 13.4 lbs  5/5 43.4, 41 lbs  Shoulder extension        Shoulder abduction 1+/5 5/5 3+/5   Shoulder adduction        Shoulder internal rotation Not tested 5/5 5/5   Shoulder external rotation Not tested 5/5 4/5   Middle trapezius        Lower trapezius        Elbow flexion Not tested 5/5    Elbow extension Not tested 5/5    Wrist flexion        Wrist extension        Wrist ulnar deviation        Wrist radial deviation        Wrist pronation        Wrist supination        Grip strength (lbs)        (Blank rows = not tested)   JOINT MOBILITY TESTING:              10/23/2021: Normal Rt GH joint mobility in mid range motion in inferior direction.  Mild limit in posterior direction.    PALPATION:  10/23/2021: Tenderness noted in Rt shoulder/arm anterior shoulder jt, axillary aspect of upper arm, anterior/middle deltoid.               TODAY'S TREATMENT:  12/17/2021: Therex:    UBE fwd/rev 4 mins each way Lvl 3, 10 sec/ minute at increased self-selected speed      Standing tband rows blue 20x c scap retraction   Standing tband GH ext blue 2 x 20 reps   Standing green band IR c towel at side 2 x 15   Standing green band ER c towel at side, slow eccentric return focus 2 x 15    Standing 2lb weighted ball circles at 90* flexion 2x 30 each direction cw, ccw   Wall push up c serratus press hold 3 seconds 2 x 15    Prone scapular retraction c GH horizontal abduction 5 sec hold 15x   Prone scapular retraction c GH scaption in  Y motion 15x  12/15/2021:   Therex:    Pulleys flexion,abduction 3 mins each way 5 sec holds short lever arm for symptoms    Standing green band IR c towel at side 2 x 15   Standing green band ER c towel at side, slow eccentric return focus 2 x 15     Standing bilateral GH flexion 1 lb x 15 , scaption x 15 (cues for slow lowering focus).  Movement 0-100 deg    Wall slides flexion 10 x 2-3 sec holds lift off    Wall push up c serratus press hold 3 seconds 2 x 10     Prone scapular retraction 5 sec hold x 10   Prone scapular retraction c GH extension 5 sec hold x 10        12/09/2021:   Therex:    UBE fwd/rev 4 mins each way Lvl 2.5   Pulleys flexion,scaption 3 mins each way 5 sec holds short lever arm for symptoms    Wall slides flexion 10 x 2-3 sec holds   Standing tband rows blue 20x c scap retraction   Standing tband GH ext blue 20 x       Sidelying Rt shoulder ER c towel at side 2 x 10    Sidelying Rt shoulder abduction 2 x 10 1 lb   Sidelying Rt shoulder flexion 2 x 10 1 lb     PATIENT EDUCATION: 11/30/2021: Education details: HEP, POC Person educated: Patient Education method: Consulting civil engineer, Media planner, Verbal cues, and Handouts Education comprehension: verbalized understanding, returned demonstration, and verbal cues required     HOME EXERCISE PROGRAM: Access Code: [email or web address] URL: [email or web address] Date: 10/23/2021 Prepared by: Scot Jun   Exercises - Seated Scapular Retraction  - 3-5 x daily - 7 x weekly - 1 sets - 10 reps - 3-5 hold - Supine Shoulder Flexion PROM (Mirrored)  - 2-3 x daily - 7 x weekly - 1-2 sets - 10 reps - 5 hold - Supine Shoulder External Rotation with Dowel (Mirrored)  - 2-3 x daily - 7 x weekly - 1-2 sets - 10 reps - 5 hold - Standing Circular Shoulder Pendulum Supported with Arm Bent  - 2-3 x daily - 7 x weekly - 1-2 sets - 10 reps - Standing Flexion Extension Shoulder Pendulum Supported with Arm Bent  - 2-3 x daily - 7 x weekly - 1-2 sets - 10 reps - Standing Horizontal Shoulder Pendulum Supported with Arm Bent  - 2-3 x daily - 7 x weekly - 1-2 sets - 10 reps   ASSESSMENT:   CLINICAL IMPRESSION: His RUE strength is continuing to progress in anti-gravity  positioning, with noted end-range strength deficits without compensation. He continues to benefit from skilled PT to address mobility and strength deficits to improve functional UE use.     OBJECTIVE IMPAIRMENTS decreased activity tolerance, decreased coordination, decreased endurance, decreased mobility, decreased ROM, decreased strength, hypomobility, increased edema, increased fascial restrictions, impaired perceived functional ability, impaired flexibility, impaired UE functional use, improper body mechanics, and pain.    ACTIVITY LIMITATIONS carrying, lifting, sleeping, dressing, and reach over head   PARTICIPATION LIMITATIONS: meal prep, cleaning, laundry, interpersonal relationship, driving, shopping, community activity, occupation, and exercises   PERSONAL FACTORS Past/current experiences (previous Rt shoulder rotator cuff surgery reported) are also affecting patient's functional outcome.    REHAB POTENTIAL: Good   CLINICAL DECISION MAKING: Stable/uncomplicated   EVALUATION COMPLEXITY: Low     GOALS: Goals reviewed with patient? Yes  Short term PT Goals (target date for Short term goals are 3 weeks (11/13/2021) Patient will demonstrate independent use of home exercise program to maintain progress from in clinic treatments. Goal status: partially met - assessed 11/30/2021(upon return to clinic)   Long term PT goals (target dates for all long term goals are 10 weeks  01/01/2022 )   1. Patient will demonstrate/report pain at worst less than or equal to 2/10 to facilitate minimal limitation in daily activity secondary to pain symptoms. Goal status: on going - assessed 11/30/2021   2. Patient will demonstrate independent use of home exercise program to facilitate ability to maintain/progress functional gains from skilled physical therapy services. Goal status:  on going - assessed 11/30/2021   3. Patient will demonstrate FOTO outcome > or = 64 % to indicate reduced disability due to  condition. Goal status:  on going - assessed 11/30/2021   4.  Patient will demonstrate Rt [place] joint mobility WFL to facilitate usual self care, dressing, reaching overhead at PLOF s limitation due to symptoms.  Goal status:  on going - assessed 11/30/2021   5.  Patient will demonstrate Rt UE MMT 5/5 throughout to facilitate usual lifting, carrying in functional activity to PLOF s limitation.   Goal status:  on going - assessed 11/30/2021   6.  Patient will demonstrate/report ability to return to work at [employer] s restriction.   Goal status:  on going - assessed 11/30/2021   7.  Patient will indicate ability to sleep in bed unrestricted due to symptoms.  a.  Goal Status:  on going - assessed 11/30/2021     PLAN: PT FREQUENCY: 2x/week then transitioning to 1-2x/week as able to preserve visit number for length of POC   PT DURATION: 10 weeks   PLANNED INTERVENTIONS: Therapeutic exercises, Therapeutic activity, Neuro Muscular re-education, Balance training, Gait training, Patient/Family education, Joint mobilization, Stair training, DME instructions, Dry Needling, Electrical stimulation, Cryotherapy, Moist heat, Taping, Ultrasound, Ionotophoresis 38m/ml Dexamethasone, and Manual therapy.  All included unless contraindicated   PLAN FOR NEXT SESSION: Continue progressing functional mobility/strength in various ranges  KJana Hakim SPT 12/18/21, 11:59 AM  This entire session of physical therapy was performed under the direct supervision of PT signing evaluation /treatment. PT reviewed note and agrees.  MScot Jun PT, DPT, OCS, ATC 12/18/21  12:14 PM

## 2021-12-22 ENCOUNTER — Ambulatory Visit (INDEPENDENT_AMBULATORY_CARE_PROVIDER_SITE_OTHER): Payer: 59 | Admitting: Rehabilitative and Restorative Service Providers"

## 2021-12-22 ENCOUNTER — Encounter: Payer: Self-pay | Admitting: Rehabilitative and Restorative Service Providers"

## 2021-12-22 DIAGNOSIS — R600 Localized edema: Secondary | ICD-10-CM

## 2021-12-22 DIAGNOSIS — M25611 Stiffness of right shoulder, not elsewhere classified: Secondary | ICD-10-CM

## 2021-12-22 DIAGNOSIS — M25511 Pain in right shoulder: Secondary | ICD-10-CM

## 2021-12-22 DIAGNOSIS — G8929 Other chronic pain: Secondary | ICD-10-CM

## 2021-12-22 DIAGNOSIS — M6281 Muscle weakness (generalized): Secondary | ICD-10-CM | POA: Diagnosis not present

## 2021-12-22 NOTE — Therapy (Addendum)
OUTPATIENT PHYSICAL THERAPY TREATMENT NOTE [place]   Patient Name: Darin Hart MRN: [id number] DOB:1971/01/28, 51 y.o., male Today's Date: 12/22/2021  PCP: Horald Pollen.   REFERRING PROVIDER: Aundra Dubin, PA-C  END OF SESSION:   PT End of Session - 12/22/21 0921     Visit Number 8    Number of Visits 20    Date for PT Re-Evaluation 01/01/22    Authorization Type [employer] $20 copay 20 PT visits per year    Authorization - Visit Number 8    Progress Note Due on Visit 10    PT Start Time 0922    PT Stop Time 1002    PT Time Calculation (min) 40 min    Activity Tolerance Patient tolerated treatment well    Behavior During Therapy WFL for tasks assessed/performed              Past Medical History:  Diagnosis Date   Arthritis    knees   Asthma    Biceps rupture, proximal    right   Heart murmur    as child   Past Surgical History:  Procedure Laterality Date   BICEPT TENODESIS Right 09/30/2021   Procedure: RIGHT BICEPS TENODESIS;  Surgeon: Leandrew Koyanagi, MD;  Location: [place];  Service: Orthopedics;  Laterality: Right;   HERNIA REPAIR     ORIF FINGER / THUMB FRACTURE Right    RHINOPLASTY     Spontaneous pneumothorax Left    20 yrs ago   Patient Active Problem List   Diagnosis Date Noted   Biceps rupture, proximal, right, initial encounter 12/08/2021   Biceps tendonosis of right shoulder 12/08/2021   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   Traumatic rupture of right biceps tendon 09/30/2021   Low back pain 01/23/2021   Chronic musculoskeletal pain 10/02/2020   History of asthma 10/02/2020   History of multiple allergies 10/02/2020   Low testosterone 02/07/2018   Screening for prostate cancer 11/28/2017    REFERRING DIAG: U31.497 (ICD-10-CM) - Biceps tendonosis of right shoulder  ONSET DATE: 09/30/2021 Surgery Rt biceps Tenodesis  THERAPY DIAG:  Chronic right shoulder pain  Stiffness of right  shoulder joint  Muscle weakness (generalized)  Localized edema  Rationale for Evaluation and Treatment Rehabilitation  PERTINENT HISTORY: History of Rt shoulder rotator cuff repair (years ago per Pt)  PRECAUTIONS: Shoulder -No lifting greater than 2 lbs through the elbow for 6 weeks from MD visit on 12/08/2021  SUBJECTIVE:  Pt indicated no specific pain to report upon arrival today.  Pt indicated doing some work with his bands at home for strengthening.  Pt indicated he reached to pick something up and felt a stretch in shoulder (happened a couple times).   PAIN:  NPRS scale: 0/10 upon arrival.  Pain location: Rt shoulder/arm Pain description: sharp, numbing pain Aggravating factors: constant pain overall, movement, nighttime pain Relieving factors: medicine, propping/wearing sling.   OBJECTIVE:    PATIENT SURVEYS:  12/15/2021:  FOTO update  : 54  10/23/2021: FOTO intake:  26  predicted:  64   COGNITION: 10/23/2021: Overall cognitive status: Within functional limits for tasks assessed                                     SENSATION: 10/23/2021: [facility]   EDEMA 10/23/2021: Localized edema to Rt shoulder noted visually.    POSTURE: 10/23/2021: High  guard of Rt shoulder (no sling today)   UPPER EXTREMITY ROM:    ROM Right 10/23/2021 Left 10/23/2021 Right 11/02/2021 Right AROM (PROM) 11/30/2021 Right AROM 12/09/2021  Shoulder flexion PROM 66 deg in supine WFL PROM 90 deg in supine 118 (120) in supine 135 in supine  Shoulder extension         Shoulder abduction PROM 56 in supine WFL PROM 90 deg in supine 90(95) in supine 128 in supine  Shoulder adduction         Shoulder internal rotation PROM 45 deg in 30 deg abduction supine    50(50) in 30 deg abduction supine   Shoulder external rotation PROM 0 deg in 30 deg abduction supine    25(25) in 30 deg abduction supine 51 in 45 deg abduction in supine  Elbow flexion         Elbow extension         Wrist flexion         Wrist extension          Wrist ulnar deviation         Wrist radial deviation         Wrist pronation         Wrist supination           10/23/2021:  All Rt shoulder PROM measured above primarily limited due to pain response.    (Blank rows = not tested)   UPPER EXTREMITY MMT:   MMT Right eval Left eval Right 12/15/2021  Left 12/15/2021 Right 12/22/2021  Shoulder flexion 1+/5 5/5 3+/5 12.4, 13.4 lbs  5/5 43.4, 41 lbs 12 lbs  Shoulder extension         Shoulder abduction 1+/5 5/5 3+/5    Shoulder adduction         Shoulder internal rotation Not tested 5/5 5/5    Shoulder external rotation Not tested 5/5 4/5    Middle trapezius         Lower trapezius         Elbow flexion Not tested 5/5     Elbow extension Not tested 5/5     Wrist flexion         Wrist extension         Wrist ulnar deviation         Wrist radial deviation         Wrist pronation         Wrist supination         Grip strength (lbs)         (Blank rows = not tested)   JOINT MOBILITY TESTING:              10/23/2021: Normal Rt GH joint mobility in mid range motion in inferior direction.  Mild limit in posterior direction.    PALPATION:  10/23/2021: Tenderness noted in Rt shoulder/arm anterior shoulder jt, axillary aspect of upper arm, anterior/middle deltoid.               TODAY'S TREATMENT:  12/22/2021:  Therex:   UBE 9 mins total (1 min warm up fwd, then 4 mins fwd/back c 10 second interval faster at end of each minute.  Lvl 4.0   Standing 2lb weighted ball circles at 90* flexion 2x 30 each direction cw, ccw   Standing shoulder flexion 90 deg to abduction to side and reverse x 15 bilateral    Wall push up c serratus press hold 3 seconds 2 x 15  Sidelying 2 lb weighted ball ER reactive catching 30 sec x 4    Prone end range y, t lift 3 sec hold bilateral x 15   Quadruped on knees position c alt shoulder tapping x 15 bilateral    Instructions for cross arm stretch for posterior capsule flexibility.    12/17/2021: Therex:     UBE fwd/rev 4 mins each way Lvl 3, 10 sec/ minute at increased self-selected speed      Standing tband rows blue 20x c scap retraction   Standing tband GH ext blue 2 x 20 reps   Standing green band IR c towel at side 2 x 15   Standing green band ER c towel at side, slow eccentric return focus 2 x 15    Standing 2lb weighted ball circles at 90* flexion 2x 30 each direction cw, ccw   Wall push up c serratus press hold 3 seconds 2 x 15    Prone scapular retraction c GH horizontal abduction 5 sec hold 15x   Prone scapular retraction c GH scaption in Y motion 15x  12/15/2021:   Therex:    Pulleys flexion,abduction 3 mins each way 5 sec holds short lever arm for symptoms    Standing green band IR c towel at side 2 x 15   Standing green band ER c towel at side, slow eccentric return focus 2 x 15    Standing bilateral GH flexion 1 lb x 15 , scaption x 15 (cues for slow lowering focus).  Movement 0-100 deg    Wall slides flexion 10 x 2-3 sec holds lift off    Wall push up c serratus press hold 3 seconds 2 x 10     Prone scapular retraction 5 sec hold x 10   Prone scapular retraction c GH extension 5 sec hold x 10         PATIENT EDUCATION: 11/30/2021: Education details: HEP, POC Person educated: Patient Education method: Consulting civil engineer, Media planner, Verbal cues, and Handouts Education comprehension: verbalized understanding, returned demonstration, and verbal cues required     HOME EXERCISE PROGRAM: Access Code: [email or web address] URL: [email or web address] Date: 10/23/2021 Prepared by: Scot Jun   Exercises - Seated Scapular Retraction  - 3-5 x daily - 7 x weekly - 1 sets - 10 reps - 3-5 hold - Supine Shoulder Flexion PROM (Mirrored)  - 2-3 x daily - 7 x weekly - 1-2 sets - 10 reps - 5 hold - Supine Shoulder External Rotation with Dowel (Mirrored)  - 2-3 x daily - 7 x weekly - 1-2 sets - 10 reps - 5 hold - Standing Circular Shoulder Pendulum Supported with Arm Bent  -  2-3 x daily - 7 x weekly - 1-2 sets - 10 reps - Standing Flexion Extension Shoulder Pendulum Supported with Arm Bent  - 2-3 x daily - 7 x weekly - 1-2 sets - 10 reps - Standing Horizontal Shoulder Pendulum Supported with Arm Bent  - 2-3 x daily - 7 x weekly - 1-2 sets - 10 reps   ASSESSMENT:   CLINICAL IMPRESSION: Strength deficits in Rt shoulder still noted and limiting in functional activity.  Continued skilled PT services recommended to improve strength and control in functional lifting.      OBJECTIVE IMPAIRMENTS decreased activity tolerance, decreased coordination, decreased endurance, decreased mobility, decreased ROM, decreased strength, hypomobility, increased edema, increased fascial restrictions, impaired perceived functional ability, impaired flexibility, impaired UE functional use, improper body mechanics, and pain.    ACTIVITY LIMITATIONS carrying,  lifting, sleeping, dressing, and reach over head   PARTICIPATION LIMITATIONS: meal prep, cleaning, laundry, interpersonal relationship, driving, shopping, community activity, occupation, and exercises   PERSONAL FACTORS Past/current experiences (previous Rt shoulder rotator cuff surgery reported) are also affecting patient's functional outcome.    REHAB POTENTIAL: Good   CLINICAL DECISION MAKING: Stable/uncomplicated   EVALUATION COMPLEXITY: Low     GOALS: Goals reviewed with patient? Yes   Short term PT Goals (target date for Short term goals are 3 weeks (11/13/2021) Patient will demonstrate independent use of home exercise program to maintain progress from in clinic treatments. Goal status: partially met - assessed 11/30/2021(upon return to clinic)   Long term PT goals (target dates for all long term goals are 10 weeks  01/01/2022 )   1. Patient will demonstrate/report pain at worst less than or equal to 2/10 to facilitate minimal limitation in daily activity secondary to pain symptoms. Goal status: on going - assessed  11/30/2021   2. Patient will demonstrate independent use of home exercise program to facilitate ability to maintain/progress functional gains from skilled physical therapy services. Goal status:  on going - assessed 11/30/2021   3. Patient will demonstrate FOTO outcome > or = 64 % to indicate reduced disability due to condition. Goal status:  on going - assessed 11/30/2021   4.  Patient will demonstrate Rt [place] joint mobility WFL to facilitate usual self care, dressing, reaching overhead at PLOF s limitation due to symptoms.  Goal status:  on going - assessed 11/30/2021   5.  Patient will demonstrate Rt UE MMT 5/5 throughout to facilitate usual lifting, carrying in functional activity to PLOF s limitation.   Goal status:  on going - assessed 11/30/2021   6.  Patient will demonstrate/report ability to return to work at [employer] s restriction.   Goal status:  on going - assessed 11/30/2021   7.  Patient will indicate ability to sleep in bed unrestricted due to symptoms.  a.  Goal Status:  on going - assessed 11/30/2021     PLAN: PT FREQUENCY: 2x/week then transitioning to 1-2x/week as able to preserve visit number for length of POC   PT DURATION: 10 weeks   PLANNED INTERVENTIONS: Therapeutic exercises, Therapeutic activity, Neuro Muscular re-education, Balance training, Gait training, Patient/Family education, Joint mobilization, Stair training, DME instructions, Dry Needling, Electrical stimulation, Cryotherapy, Moist heat, Taping, Ultrasound, Ionotophoresis 2m/ml Dexamethasone, and Manual therapy.  All included unless contraindicated   PLAN FOR NEXT SESSION: Continue progressing functional mobility/strength in various ranges.  Encouraged additional visit scheduling (does not currently have one scheduled past today)   MScot Jun PT, DPT, OCS, ATC 12/22/21  10:01 AM   PHYSICAL THERAPY DISCHARGE SUMMARY  Visits from Start of Care: 8  Current functional level related to goals / functional  outcomes: See note   Remaining deficits: See note   Education / Equipment: HEP  Patient goals were partially met. Patient is being discharged due to not returning since the last visit.  MScot Jun PT, DPT, OCS, ATC 03/02/22  9:01 AM

## 2022-01-12 ENCOUNTER — Ambulatory Visit (INDEPENDENT_AMBULATORY_CARE_PROVIDER_SITE_OTHER): Payer: Commercial Managed Care - HMO | Admitting: Orthopaedic Surgery

## 2022-01-12 ENCOUNTER — Encounter: Payer: Self-pay | Admitting: Orthopaedic Surgery

## 2022-01-12 DIAGNOSIS — S46211A Strain of muscle, fascia and tendon of other parts of biceps, right arm, initial encounter: Secondary | ICD-10-CM | POA: Diagnosis not present

## 2022-01-12 NOTE — Progress Notes (Signed)
   Post-Op Visit Note   Patient: Darin Hart           Date of Birth: 1970-09-26           MRN: [id number] Visit Date: 01/12/2022 PCP: Georgina Quint, MD   Assessment & Plan:  Chief Complaint:  Chief Complaint  Patient presents with   Right Shoulder - Follow-up    Biceps tenodesis   Visit Diagnoses:  1. Traumatic rupture of right biceps tendon, initial encounter     Plan: Darin Hart is a little over 12 weeks status post right proximal biceps tenodesis.  He feels that his shoulder range of motion is getting better.  Occasional sharp pains around the surgical site.  Has been doing physical therapy.  Currently working with restrictions.  Examination of right shoulder girdle shows a fully healed surgical scar.  Mild Popeye deformity.  Range of motion has improved significantly.  Overall Darin Hart is making good progress.  He will continue to do PT.  Work note was provided for another 6 weeks.  Recheck in 6 weeks.  Follow-Up Instructions: Return in about 6 weeks (around 02/23/2022).   Orders:  No orders of the defined types were placed in this encounter.  No orders of the defined types were placed in this encounter.   Imaging: No results found.  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   Traumatic rupture of right biceps tendon 09/30/2021   Low back pain 01/23/2021   Chronic musculoskeletal pain 10/02/2020   History of asthma 10/02/2020   History of multiple allergies 10/02/2020   Low testosterone 02/07/2018   Screening for prostate cancer 11/28/2017   Past Medical History:  Diagnosis Date   Arthritis    knees   Asthma    Biceps rupture, proximal    right   Heart murmur    as child    Family History  Problem Relation Age of Onset   Hypertension Mother     Other Neg Hx        hypogonadism    Past Surgical History:  Procedure Laterality Date   BICEPT TENODESIS Right 09/30/2021   Procedure: RIGHT BICEPS TENODESIS;  Surgeon: Tarry Kos, MD;  Location: [facility];  Service: Orthopedics;  Laterality: Right;   HERNIA REPAIR     ORIF FINGER / THUMB FRACTURE Right    RHINOPLASTY     Spontaneous pneumothorax Left    20 yrs ago   Social History   Occupational History   Not on file  Tobacco Use   Smoking status: Never   Smokeless tobacco: Never  Vaping Use   Vaping Use: Never used  Substance and Sexual Activity   Alcohol use: Yes    Comment: OCCASIONALLY   Drug use: Never   Sexual activity: Yes    Partners: Female

## 2022-01-13 ENCOUNTER — Encounter: Payer: Self-pay | Admitting: Emergency Medicine

## 2022-01-13 ENCOUNTER — Ambulatory Visit (INDEPENDENT_AMBULATORY_CARE_PROVIDER_SITE_OTHER): Payer: Commercial Managed Care - HMO | Admitting: Emergency Medicine

## 2022-01-13 VITALS — BP 110/70 | HR 67 | Temp 98.3°F | Ht 72.0 in | Wt 175.1 lb

## 2022-01-13 DIAGNOSIS — Z23 Encounter for immunization: Secondary | ICD-10-CM | POA: Diagnosis not present

## 2022-01-13 DIAGNOSIS — S46211D Strain of muscle, fascia and tendon of other parts of biceps, right arm, subsequent encounter: Secondary | ICD-10-CM | POA: Diagnosis not present

## 2022-01-13 DIAGNOSIS — Z1211 Encounter for screening for malignant neoplasm of colon: Secondary | ICD-10-CM

## 2022-01-13 DIAGNOSIS — Z8709 Personal history of other diseases of the respiratory system: Secondary | ICD-10-CM | POA: Diagnosis not present

## 2022-01-13 MED ORDER — ALBUTEROL SULFATE HFA 108 (90 BASE) MCG/ACT IN AERS: 2.0000 | INHALATION_SPRAY | Freq: Four times a day (QID) | RESPIRATORY_TRACT | 3 refills | Status: DC | PRN

## 2022-01-13 NOTE — Progress Notes (Signed)
Darin Hart 51 y.o.   Chief Complaint  Patient presents with   Follow-up    6 mnth f/u appt , pt wants order for cologuard     HISTORY OF PRESENT ILLNESS: This is a 51 y.o. male here for 78-month follow-up. Doing well. Has not plaints or medical concerns today. Status post biceps rupture surgery 09/30/2021 with very good postop progress and physical rehab.  HPI   Prior to Admission medications   Medication Sig Start Date End Date Taking? Authorizing Provider  albuterol (VENTOLIN HFA) 108 (90 Base) MCG/ACT inhaler Inhale 2 puffs into the lungs every 6 (six) hours as needed for wheezing or shortness of breath. 10/02/20  Yes Janmarie Smoot, Eilleen Kempf, MD  diclofenac (VOLTAREN) 75 MG EC tablet Take 1 tablet (75 mg total) by mouth 2 (two) times daily. 11/10/21  Yes Cristie Hem, PA-C  HYDROcodone-acetaminophen (NORCO) 5-325 MG tablet Take 1 tablet by mouth 3 (three) times daily as needed. 10/27/21  Yes Cristie Hem, PA-C  methocarbamol (ROBAXIN) 500 MG tablet Take 1 tablet (500 mg total) by mouth every 6 (six) hours as needed for muscle spasms. 09/30/21  Yes Tarry Kos, MD  Multiple Vitamin (MULTIVITAMIN WITH MINERALS) TABS tablet Take 1 tablet by mouth daily.   Yes [provider]  ondansetron (ZOFRAN) 4 MG tablet Take 1-2 tablets (4-8 mg total) by mouth every 8 (eight) hours as needed for nausea or vomiting. 09/30/21  Yes Tarry Kos, MD    Allergies  Allergen Reactions   Shellfish Allergy Anaphylaxis   Other Other (See Comments)    Migraines, itchy tongue, burning mouth All Tree Nuts   Penicillins     Pt was told he could not take this, his reaction is unknown Has patient had a PCN reaction causing immediate rash, facial/tongue/throat swelling, SOB or lightheadedness with hypotension: Unknown Has patient had a PCN reaction causing severe rash involving mucus membranes or skin necrosis: Unknown Has patient had a PCN reaction that required hospitalization: NO Has  patient had a PCN reaction occurring within the last 10 years: NO If all of the above answers are "NO", then may proceed with Cephalospo    Patient Active Problem List   Diagnosis Date Noted   Biceps rupture, proximal, right, initial encounter 12/08/2021   Biceps tendonosis of right shoulder 12/08/2021   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   Traumatic rupture of right biceps tendon 09/30/2021   Low back pain 01/23/2021   Chronic musculoskeletal pain 10/02/2020   History of asthma 10/02/2020   History of multiple allergies 10/02/2020   Low testosterone 02/07/2018   Screening for prostate cancer 11/28/2017    Past Medical History:  Diagnosis Date   Arthritis    knees   Asthma    Biceps rupture, proximal    right   Heart murmur    as child    Past Surgical History:  Procedure Laterality Date   BICEPT TENODESIS Right 09/30/2021   Procedure: RIGHT BICEPS TENODESIS;  Surgeon: Tarry Kos, MD;  [facility];  Service: Orthopedics;  Laterality: Right;   HERNIA REPAIR     ORIF FINGER / THUMB FRACTURE Right    RHINOPLASTY     Spontaneous pneumothorax Left    20 yrs ago    Social History   Socioeconomic History   Marital status: Married    Spouse name: Not on file   Number of children: Not on file   Years of  education: Not on file   Highest education level: Not on file  Occupational History   Not on file  Tobacco Use   Smoking status: Never   Smokeless tobacco: Never  Vaping Use   Vaping Use: Never used  Substance and Sexual Activity   Alcohol use: Yes    Comment: OCCASIONALLY   Drug use: Never   Sexual activity: Yes    Partners: Female  Other Topics Concern   Not on file  Social History Narrative   Not on file   Social Determinants of Health   Financial Resource Strain: Low Risk  (12/05/2018)   Overall Financial Resource Strain (CARDIA)    Difficulty of Paying Living Expenses: Not hard at all  Food Insecurity: No  Food Insecurity (12/05/2018)   Hunger Vital Sign    Worried About Running Out of Food in the Last Year: Never true    Ran Out of Food in the Last Year: Never true  Transportation Needs: No Transportation Needs (12/05/2018)   PRAPARE - Administrator, Civil Service (Medical): No    Lack of Transportation (Non-Medical): No  Physical Activity: Not on file  Stress: Stress Concern Present (12/05/2018)   [employer] of Occupational Health - Occupational Stress Questionnaire    Feeling of Stress : To some extent  Social Connections: Unknown (12/05/2018)   Social Connection and Isolation Panel [NHANES]    Frequency of Communication with Friends and Family: More than three times a week    Frequency of Social Gatherings with Friends and Family: More than three times a week    Attends Religious Services: Not on file    Active Member of Clubs or Organizations: Not on file    Attends Banker Meetings: Not on file    Marital Status: Married  Intimate Partner Violence: Not At Risk (12/05/2018)   Humiliation, Afraid, Rape, and Kick questionnaire    Fear of Current or Ex-Partner: No    Emotionally Abused: No    Physically Abused: No    Sexually Abused: No    Family History  Problem Relation Age of Onset   Hypertension Mother    Other Neg Hx        hypogonadism     Review of Systems  Constitutional: Negative.  Negative for chills and fever.  HENT: Negative.  Negative for congestion and sore throat.   Respiratory: Negative.  Negative for cough and shortness of breath.   Cardiovascular: Negative.  Negative for chest pain and palpitations.  Gastrointestinal:  Negative for abdominal pain, diarrhea, nausea and vomiting.  Genitourinary: Negative.   Skin: Negative.  Negative for rash.  Neurological: Negative.  Negative for dizziness and headaches.  All other systems reviewed and are negative.  Today's Vitals   01/13/22 0939  BP: 110/70  Pulse: 67  Temp: 98.3 F  (36.8 C)  TempSrc: Oral  SpO2: 98%  Weight: 175 lb 2 oz (79.4 kg)  Height: 6' (1.829 m)   Body mass index is 23.75 kg/m.   Physical Exam Vitals reviewed.  Constitutional:      Appearance: Normal appearance.  HENT:     Head: Normocephalic.     Mouth/Throat:     Mouth: Mucous membranes are moist.     Pharynx: Oropharynx is clear.  Eyes:     Extraocular Movements: Extraocular movements intact.     Pupils: Pupils are equal, round, and reactive to light.  Cardiovascular:     Rate and Rhythm: Normal rate and regular  rhythm.     Pulses: Normal pulses.     Heart sounds: Normal heart sounds.  Pulmonary:     Effort: Pulmonary effort is normal.     Breath sounds: Normal breath sounds.  Abdominal:     General: There is no distension.     Palpations: Abdomen is soft.     Tenderness: There is no abdominal tenderness.  Musculoskeletal:     Cervical back: No tenderness.  Lymphadenopathy:     Cervical: No cervical adenopathy.  Skin:    General: Skin is warm and dry.     Capillary Refill: Capillary refill takes less than 2 seconds.  Neurological:     General: No focal deficit present.     Mental Status: He is alert and oriented to person, place, and time.  Psychiatric:        Mood and Affect: Mood normal.        Behavior: Behavior normal.      ASSESSMENT & PLAN: A total of 30 minutes was spent with the patient and counseling/coordination of care regarding preparing for this visit, review of most recent office visit notes, review of orthopedic office visit notes and surgical notes, review of all medications, review of most recent blood work results, review of health maintenance items, education on nutrition, prognosis, documentation, need for follow-up.  Problem List Items Addressed This Visit       Musculoskeletal and Integument   Biceps rupture, proximal - Primary    Much improved and recovering very well. No concerns.        Other   History of asthma    Stable and  well-controlled. No recent need for albuterol rescue inhaler.       Relevant Medications   albuterol (VENTOLIN HFA) 108 (90 Base) MCG/ACT inhaler   Other Visit Diagnoses     Colon cancer screening       Relevant Orders   Cologuard   Need for vaccination       Relevant Orders   Zoster Recombinant (Shingrix ) (Completed)      Patient Instructions  Health Maintenance, Male Adopting a healthy lifestyle and getting preventive care are important in promoting health and wellness. Ask your health care provider about: The right schedule for you to have regular tests and exams. Things you can do on your own to prevent diseases and keep yourself healthy. What should I know about diet, weight, and exercise? Eat a healthy diet  Eat a diet that includes plenty of vegetables, fruits, low-fat dairy products, and lean protein. Do not eat a lot of foods that are high in solid fats, added sugars, or sodium. Maintain a healthy weight Body mass index (BMI) is a measurement that can be used to identify possible weight problems. It estimates body fat based on height and weight. Your health care provider can help determine your BMI and help you achieve or maintain a healthy weight. Get regular exercise Get regular exercise. This is one of the most important things you can do for your health. Most adults should: Exercise for at least 150 minutes each week. The exercise should increase your heart rate and make you sweat (moderate-intensity exercise). Do strengthening exercises at least twice a week. This is in addition to the moderate-intensity exercise. Spend less time sitting. Even light physical activity can be beneficial. Watch cholesterol and blood lipids Have your blood tested for lipids and cholesterol at 51 years of age, then have this test every 5 years. You may need to  have your cholesterol levels checked more often if: Your lipid or cholesterol levels are high. You are older than 51 years of  age. You are at high risk for heart disease. What should I know about cancer screening? Many types of cancers can be detected early and may often be prevented. Depending on your health history and family history, you may need to have cancer screening at various ages. This may include screening for: Colorectal cancer. Prostate cancer. Skin cancer. Lung cancer. What should I know about heart disease, diabetes, and high blood pressure? Blood pressure and heart disease High blood pressure causes heart disease and increases the risk of stroke. This is more likely to develop in people who have high blood pressure readings or are overweight. Talk with your health care provider about your target blood pressure readings. Have your blood pressure checked: Every 3-5 years if you are 22-28 years of age. Every year if you are 95 years old or older. If you are between the ages of 85 and 56 and are a current or former smoker, ask your health care provider if you should have a one-time screening for abdominal aortic aneurysm (AAA). Diabetes Have regular diabetes screenings. This checks your fasting blood sugar level. Have the screening done: Once every three years after age 18 if you are at a normal weight and have a low risk for diabetes. More often and at a younger age if you are overweight or have a high risk for diabetes. What should I know about preventing infection? Hepatitis B If you have a higher risk for hepatitis B, you should be screened for this virus. Talk with your health care provider to find out if you are at risk for hepatitis B infection. Hepatitis C Blood testing is recommended for: Everyone born from [birth year] through 1965. Anyone with known risk factors for hepatitis C. Sexually transmitted infections (STIs) You should be screened each year for STIs, including gonorrhea and chlamydia, if: You are sexually active and are younger than 51 years of age. You are older than 51 years of age  and your health care provider tells you that you are at risk for this type of infection. Your sexual activity has changed since you were last screened, and you are at increased risk for chlamydia or gonorrhea. Ask your health care provider if you are at risk. Ask your health care provider about whether you are at high risk for HIV. Your health care provider may recommend a prescription medicine to help prevent HIV infection. If you choose to take medicine to prevent HIV, you should first get tested for HIV. You should then be tested every 3 months for as long as you are taking the medicine. Follow these instructions at home: Alcohol use Do not drink alcohol if your health care provider tells you not to drink. If you drink alcohol: Limit how much you have to 0-2 drinks a day. Know how much alcohol is in your drink. In the [place] one drink equals one 12 oz bottle of beer (355 mL), one 5 oz glass of wine (148 mL), or one 1 oz glass of hard liquor (44 mL). Lifestyle Do not use any products that contain nicotine or tobacco. These products include cigarettes, chewing tobacco, and vaping devices, such as e-cigarettes. If you need help quitting, ask your health care provider. Do not use street drugs. Do not share needles. Ask your health care provider for help if you need support or information about quitting drugs. General instructions  Schedule regular health, dental, and eye exams. Stay current with your vaccines. Tell your health care provider if: You often feel depressed. You have ever been abused or do not feel safe at home. Summary Adopting a healthy lifestyle and getting preventive care are important in promoting health and wellness. Follow your health care provider's instructions about healthy diet, exercising, and getting tested or screened for diseases. Follow your health care provider's instructions on monitoring your cholesterol and blood pressure. This information is not intended to  replace advice given to you by your health care provider. Make sure you discuss any questions you have with your health care provider. Document Revised: 09/29/2020 Document Reviewed: 09/29/2020 Elsevier Patient Education  2023 Elsevier Inc.    Edwina Barth, MD [facility]

## 2022-01-13 NOTE — Assessment & Plan Note (Signed)
Much improved and recovering very well. No concerns.

## 2022-01-13 NOTE — Patient Instructions (Signed)
Health Maintenance, Male Adopting a healthy lifestyle and getting preventive care are important in promoting health and wellness. Ask your health care provider about: The right schedule for you to have regular tests and exams. Things you can do on your own to prevent diseases and keep yourself healthy. What should I know about diet, weight, and exercise? Eat a healthy diet  Eat a diet that includes plenty of vegetables, fruits, low-fat dairy products, and lean protein. Do not eat a lot of foods that are high in solid fats, added sugars, or sodium. Maintain a healthy weight Body mass index (BMI) is a measurement that can be used to identify possible weight problems. It estimates body fat based on height and weight. Your health care provider can help determine your BMI and help you achieve or maintain a healthy weight. Get regular exercise Get regular exercise. This is one of the most important things you can do for your health. Most adults should: Exercise for at least 150 minutes each week. The exercise should increase your heart rate and make you sweat (moderate-intensity exercise). Do strengthening exercises at least twice a week. This is in addition to the moderate-intensity exercise. Spend less time sitting. Even light physical activity can be beneficial. Watch cholesterol and blood lipids Have your blood tested for lipids and cholesterol at 51 years of age, then have this test every 5 years. You may need to have your cholesterol levels checked more often if: Your lipid or cholesterol levels are high. You are older than 51 years of age. You are at high risk for heart disease. What should I know about cancer screening? Many types of cancers can be detected early and may often be prevented. Depending on your health history and family history, you may need to have cancer screening at various ages. This may include screening for: Colorectal cancer. Prostate cancer. Skin cancer. Lung  cancer. What should I know about heart disease, diabetes, and high blood pressure? Blood pressure and heart disease High blood pressure causes heart disease and increases the risk of stroke. This is more likely to develop in people who have high blood pressure readings or are overweight. Talk with your health care provider about your target blood pressure readings. Have your blood pressure checked: Every 3-5 years if you are 18-39 years of age. Every year if you are 40 years old or older. If you are between the ages of 65 and 75 and are a current or former smoker, ask your health care provider if you should have a one-time screening for abdominal aortic aneurysm (AAA). Diabetes Have regular diabetes screenings. This checks your fasting blood sugar level. Have the screening done: Once every three years after age 45 if you are at a normal weight and have a low risk for diabetes. More often and at a younger age if you are overweight or have a high risk for diabetes. What should I know about preventing infection? Hepatitis B If you have a higher risk for hepatitis B, you should be screened for this virus. Talk with your health care provider to find out if you are at risk for hepatitis B infection. Hepatitis C Blood testing is recommended for: Everyone born from 1945 through 1965. Anyone with known risk factors for hepatitis C. Sexually transmitted infections (STIs) You should be screened each year for STIs, including gonorrhea and chlamydia, if: You are sexually active and are younger than 51 years of age. You are older than 51 years of age and your   health care provider tells you that you are at risk for this type of infection. Your sexual activity has changed since you were last screened, and you are at increased risk for chlamydia or gonorrhea. Ask your health care provider if you are at risk. Ask your health care provider about whether you are at high risk for HIV. Your health care provider  may recommend a prescription medicine to help prevent HIV infection. If you choose to take medicine to prevent HIV, you should first get tested for HIV. You should then be tested every 3 months for as long as you are taking the medicine. Follow these instructions at home: Alcohol use Do not drink alcohol if your health care provider tells you not to drink. If you drink alcohol: Limit how much you have to 0-2 drinks a day. Know how much alcohol is in your drink. In the [place] one drink equals one 12 oz bottle of beer (355 mL), one 5 oz glass of wine (148 mL), or one 1 oz glass of hard liquor (44 mL). Lifestyle Do not use any products that contain nicotine or tobacco. These products include cigarettes, chewing tobacco, and vaping devices, such as e-cigarettes. If you need help quitting, ask your health care provider. Do not use street drugs. Do not share needles. Ask your health care provider for help if you need support or information about quitting drugs. General instructions Schedule regular health, dental, and eye exams. Stay current with your vaccines. Tell your health care provider if: You often feel depressed. You have ever been abused or do not feel safe at home. Summary Adopting a healthy lifestyle and getting preventive care are important in promoting health and wellness. Follow your health care provider's instructions about healthy diet, exercising, and getting tested or screened for diseases. Follow your health care provider's instructions on monitoring your cholesterol and blood pressure. This information is not intended to replace advice given to you by your health care provider. Make sure you discuss any questions you have with your health care provider. Document Revised: 09/29/2020 Document Reviewed: 09/29/2020 Elsevier Patient Education  2023 Elsevier Inc.  

## 2022-01-13 NOTE — Assessment & Plan Note (Signed)
Stable and well-controlled. No recent need for albuterol rescue inhaler.

## 2022-01-19 ENCOUNTER — Ambulatory Visit (INDEPENDENT_AMBULATORY_CARE_PROVIDER_SITE_OTHER): Payer: Commercial Managed Care - HMO | Admitting: Orthopaedic Surgery

## 2022-01-19 ENCOUNTER — Encounter: Payer: Self-pay | Admitting: Orthopaedic Surgery

## 2022-01-19 DIAGNOSIS — M67813 Other specified disorders of tendon, right shoulder: Secondary | ICD-10-CM | POA: Diagnosis not present

## 2022-01-19 NOTE — Progress Notes (Signed)
   Post-Op Visit Note   Patient: Darin Hart           Date of Birth: 05-09-1971           MRN: [id number] Visit Date: 01/19/2022 PCP: Georgina Quint, MD   Assessment & Plan:  Chief Complaint:  Chief Complaint  Patient presents with   Right Shoulder - Follow-up    S/p right biceps tenodesis   Visit Diagnoses:  1. Biceps tendonosis of right shoulder     Plan: Patient is a pleasant 51 year old gentleman who comes in today approximately 13 weeks status post right shoulder biceps tenodesis 09/30/2021.  He has been making great gains in physical therapy.  He continues to work on home exercise program as well.  Overall, he is doing well.  Examination of his right shoulder reveals near full forward flexion.  He is slightly limited with internal and external rotation.  He is neurovascular intact distally.  At this point, would like for him to continue with physical therapy as well as his home exercise program.  He will follow-up with [place] at his regular scheduled appointment in early October.  Call with concerns or questions in the meantime.  Follow-Up Instructions: Return for f/u at his regularly scheduled appointment in 10/3.   Orders:  No orders of the defined types were placed in this encounter.  No orders of the defined types were placed in this encounter.   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   Low back pain 01/23/2021   Chronic musculoskeletal pain 10/02/2020   History of asthma 10/02/2020   History of multiple allergies 10/02/2020   Low testosterone 02/07/2018   Past Medical History:  Diagnosis Date   Arthritis    knees   Asthma    Biceps rupture, proximal    right   Heart murmur    as child    Family History  Problem Relation Age of Onset   Hypertension Mother    Other Neg Hx        hypogonadism     Past Surgical History:  Procedure Laterality Date   BICEPT TENODESIS Right 09/30/2021   Procedure: RIGHT BICEPS TENODESIS;  Surgeon: Tarry Kos, MD;  Location: [facility];  Service: Orthopedics;  Laterality: Right;   HERNIA REPAIR     ORIF FINGER / THUMB FRACTURE Right    RHINOPLASTY     Spontaneous pneumothorax Left    20 yrs ago   Social History   Occupational History   Not on file  Tobacco Use   Smoking status: Never   Smokeless tobacco: Never  Vaping Use   Vaping Use: Never used  Substance and Sexual Activity   Alcohol use: Yes    Comment: OCCASIONALLY   Drug use: Never   Sexual activity: Yes    Partners: Female

## 2022-02-06 LAB — COLOGUARD: COLOGUARD: NEGATIVE

## 2022-02-15 ENCOUNTER — Emergency Department (HOSPITAL_COMMUNITY): Payer: Commercial Managed Care - HMO

## 2022-02-15 ENCOUNTER — Other Ambulatory Visit: Payer: Self-pay

## 2022-02-15 ENCOUNTER — Encounter (HOSPITAL_COMMUNITY): Payer: Self-pay

## 2022-02-15 ENCOUNTER — Emergency Department (HOSPITAL_COMMUNITY)
Admission: EM | Admit: 2022-02-15 | Discharge: 2022-02-15 | Disposition: A | Discharge status: Home / Self Care | Payer: Commercial Managed Care - HMO | Attending: Emergency Medicine | Admitting: Emergency Medicine

## 2022-02-15 DIAGNOSIS — R1031 Right lower quadrant pain: Secondary | ICD-10-CM | POA: Insufficient documentation

## 2022-02-15 DIAGNOSIS — R109 Unspecified abdominal pain: Secondary | ICD-10-CM

## 2022-02-15 LAB — CBC WITH DIFFERENTIAL/PLATELET
Abs Immature Granulocytes: 0.01 10*3/uL (ref 0.00–0.07)
Basophils Absolute: 0 10*3/uL (ref 0.0–0.1)
Basophils Relative: 1 %
Eosinophils Absolute: 0.1 10*3/uL (ref 0.0–0.5)
Eosinophils Relative: 3 %
HCT: 40.9 % (ref 39.0–52.0)
Hemoglobin: 13.8 g/dL (ref 13.0–17.0)
Immature Granulocytes: 0 %
Lymphocytes Relative: 38 %
Lymphs Abs: 1.6 10*3/uL (ref 0.7–4.0)
MCH: 30.9 pg (ref 26.0–34.0)
MCHC: 33.7 g/dL (ref 30.0–36.0)
MCV: 91.7 fL (ref 80.0–100.0)
Monocytes Absolute: 0.4 10*3/uL (ref 0.1–1.0)
Monocytes Relative: 11 %
Neutro Abs: 1.9 10*3/uL (ref 1.7–7.7)
Neutrophils Relative %: 47 %
Platelets: 184 10*3/uL (ref 150–400)
RBC: 4.46 MIL/uL (ref 4.22–5.81)
RDW: 12.6 % (ref 11.5–15.5)
WBC: 4.1 10*3/uL (ref 4.0–10.5)
nRBC: 0 % (ref 0.0–0.2)

## 2022-02-15 LAB — LIPASE, BLOOD: Lipase: 30 U/L (ref 11–51)

## 2022-02-15 LAB — URINALYSIS, ROUTINE W REFLEX MICROSCOPIC
Bilirubin Urine: NEGATIVE
Glucose, UA: NEGATIVE mg/dL
Hgb urine dipstick: NEGATIVE
Ketones, ur: NEGATIVE mg/dL
Leukocytes,Ua: NEGATIVE
Nitrite: NEGATIVE
Protein, ur: NEGATIVE mg/dL
Specific Gravity, Urine: 1.024 (ref 1.005–1.030)
pH: 6 (ref 5.0–8.0)

## 2022-02-15 LAB — COMPREHENSIVE METABOLIC PANEL
ALT: 19 U/L (ref 0–44)
AST: 23 U/L (ref 15–41)
Albumin: 4.3 g/dL (ref 3.5–5.0)
Alkaline Phosphatase: 67 U/L (ref 38–126)
Anion gap: 7 (ref 5–15)
BUN: 16 mg/dL (ref 6–20)
CO2: 27 mmol/L (ref 22–32)
Calcium: 9.1 mg/dL (ref 8.9–10.3)
Chloride: 106 mmol/L (ref 98–111)
Creatinine, Ser: 1.12 mg/dL (ref 0.61–1.24)
GFR, Estimated: >60 mL/min (ref >60)
Glucose, Bld: 88 mg/dL (ref 70–99)
Potassium: 4.1 mmol/L (ref 3.5–5.1)
Sodium: 140 mmol/L (ref 135–145)
Total Bilirubin: 0.6 mg/dL (ref 0.3–1.2)
Total Protein: 7.1 g/dL (ref 6.5–8.1)

## 2022-02-15 MED ORDER — NAPROXEN 500 MG PO TABS: 500.0000 mg | ORAL_TABLET | Freq: Two times a day (BID) | ORAL | 0 refills | Status: AC

## 2022-02-15 MED ORDER — METHOCARBAMOL 500 MG PO TABS
500.0000 mg | ORAL_TABLET | Freq: Two times a day (BID) | ORAL | 0 refills | Status: AC
Start: 2022-02-15 — End: 2022-02-22

## 2022-02-15 MED ORDER — KETOROLAC TROMETHAMINE 30 MG/ML IJ SOLN
30.0000 mg | Freq: Once | INTRAMUSCULAR | Status: AC
Start: 2022-02-15 — End: 2022-02-15
  Administered 2022-02-15: 30 mg via INTRAMUSCULAR
  Filled 2022-02-15: qty 1

## 2022-02-15 MED ORDER — LIDOCAINE 5 % EX PTCH
1.0000 | MEDICATED_PATCH | CUTANEOUS | Status: DC
  Administered 2022-02-15: 1 via TRANSDERMAL
  Filled 2022-02-15: qty 1

## 2022-02-15 NOTE — ED Provider Triage Note (Signed)
Emergency Medicine Provider Triage Evaluation Note  Darin Hart , a 51 y.o. male  was evaluated in triage.  Pt complains of right flank pain.  Patient states he has noticed this pain since last Wednesday and it radiates into his right lower quadrant of his abdomen.  He states that at first he thought it might have been a pulled muscle because he has been doing a lot of lateral abdominal training recently.  He says that the pain has gradually gotten worse to the point where it is severe now.  He has never had any pain like this before.  He denies any shortness of breath, chest pain, nausea, vomiting, dysuria, hematuria, difficulty urinating, fevers, chills, or testicular pain.  Reports no history of kidney stones or gallbladder problems.  He does have a history of a spontaneous PTX. No history of appendectomy.   Review of Systems  Positive:  Negative:   Physical Exam  BP 125/81 (BP Location: Left Arm)   Pulse 66   Temp 98.6 F (37 C) (Oral)   Resp 16   Ht 6' (1.829 m)   Wt 81.6 kg   SpO2 97%   BMI 24.41 kg/m  Gen:   Awake, no distress   Resp:  Normal effort  MSK:   Moves extremities without difficulty  Other:  Minimal R CVA tenderness. Negative Mcburneys. Does have some RUQ tenderness. Negative Murphy. Chest rise is equal bilaterally.   Medical Decision Making  Medically screening exam initiated at 1:31 PM.  Appropriate orders placed.  LERAY GARVERICK was informed that the remainder of the evaluation will be completed by another provider, this initial triage assessment does not replace that evaluation, and the importance of remaining in the ED until their evaluation is complete.     Adolphus Birchwood, [place] 02/15/22 1334

## 2022-02-15 NOTE — Discharge Instructions (Signed)
Your labs and CT today were normal and did not show any evidence of kidney stones, appendicitis, or gallbladder issues. There were also no signs in your urine for a kidney or bladder infection. I suspect that this is musculoskeletal related.  I have prescribed you Naproxen to take as well as Robaxin which is a muscle relaxer. You should not drive, work, consume alcohol, or operate machinery while taking this medication as it can make you very drowsy.    Please follow up with your PCP if you continue to have persistent symptoms.

## 2022-02-15 NOTE — ED Notes (Signed)
Urine requested  ?

## 2022-02-15 NOTE — ED Provider Notes (Signed)
[facility] DEPT Provider Note   CSN: [id number] Arrival date & time: 02/15/22  1129     History PMH of prior spont. PTX Chief Complaint  Patient presents with   Flank Pain    Darin Hart is a 51 y.o. male.  Pt complains of right flank pain.  Patient states he has noticed this pain since last Wednesday and it radiates into his right lower quadrant of his abdomen.  He states that at first he thought it might have been a pulled muscle because he has been doing a lot of lateral abdominal training recently.  He says that the pain has gradually gotten worse to the point where it is severe now.  He has never had any pain like this before.  He denies any shortness of breath, chest pain, nausea, vomiting, dysuria, hematuria, difficulty urinating, fevers, chills, or testicular pain.   Reports no history of kidney stones or gallbladder problems.  He does have a history of a spontaneous PTX. No history of appendectomy.    Flank Pain Associated symptoms include abdominal pain.       Home Medications Prior to Admission medications   Medication Sig Start Date End Date Taking? Authorizing Provider  methocarbamol (ROBAXIN) 500 MG tablet Take 1 tablet (500 mg total) by mouth 2 (two) times daily for 7 days. 02/15/22 02/22/22 Yes Kenna Seward, Adora Fridge, PA-C  naproxen (NAPROSYN) 500 MG tablet Take 1 tablet (500 mg total) by mouth 2 (two) times daily for 7 days. 02/15/22 02/22/22 Yes Thornton Dohrmann, Adora Fridge, PA-C  albuterol (VENTOLIN HFA) 108 (90 Base) MCG/ACT inhaler Inhale 2 puffs into the lungs every 6 (six) hours as needed for wheezing or shortness of breath. 01/13/22   Horald Pollen, MD  diclofenac (VOLTAREN) 75 MG EC tablet Take 1 tablet (75 mg total) by mouth 2 (two) times daily. 11/10/21   Aundra Dubin, PA-C  HYDROcodone-acetaminophen (NORCO) 5-325 MG tablet Take 1 tablet by mouth 3 (three) times daily as needed. 10/27/21   Aundra Dubin, PA-C  Multiple  Vitamin (MULTIVITAMIN WITH MINERALS) TABS tablet Take 1 tablet by mouth daily.    [provider]  ondansetron (ZOFRAN) 4 MG tablet Take 1-2 tablets (4-8 mg total) by mouth every 8 (eight) hours as needed for nausea or vomiting. 09/30/21   Leandrew Koyanagi, MD      Allergies    Shellfish allergy, Other, and Penicillins    Review of Systems   Review of Systems  Gastrointestinal:  Positive for abdominal pain.  Genitourinary:  Positive for flank pain.  All other systems reviewed and are negative.   Physical Exam Updated Vital Signs BP 133/73 (BP Location: Left Arm)   Pulse 63   Temp 98.5 F (36.9 C) (Oral)   Resp 16   Ht 6' (1.829 m)   Wt 81.6 kg   SpO2 100%   BMI 24.41 kg/m  Physical Exam Vitals and nursing note reviewed.  Constitutional:      General: He is not in acute distress.    Appearance: Normal appearance. He is not ill-appearing, toxic-appearing or diaphoretic.  HENT:     Head: Normocephalic and atraumatic.     Nose: No nasal deformity.     Mouth/Throat:     Lips: Pink. No lesions.     Mouth: Mucous membranes are moist. No injury, lacerations, oral lesions or angioedema.     Pharynx: Oropharynx is clear. Uvula midline. No pharyngeal swelling, oropharyngeal exudate, posterior oropharyngeal erythema or  uvula swelling.  Eyes:     General: Gaze aligned appropriately. No scleral icterus.       Right eye: No discharge.        Left eye: No discharge.     Conjunctiva/sclera: Conjunctivae normal.     Right eye: Right conjunctiva is not injected. No exudate or hemorrhage.    Left eye: Left conjunctiva is not injected. No exudate or hemorrhage. Cardiovascular:     Rate and Rhythm: Normal rate and regular rhythm.     Pulses: Normal pulses.          Radial pulses are 2+ on the right side and 2+ on the left side.       Dorsalis pedis pulses are 2+ on the right side and 2+ on the left side.     Heart sounds: Normal heart sounds, S1 normal and S2 normal. Heart sounds not  distant. No murmur heard.    No friction rub. No gallop. No S3 or S4 sounds.  Pulmonary:     Effort: Pulmonary effort is normal. No accessory muscle usage or respiratory distress.     Breath sounds: Normal breath sounds. No stridor. No wheezing, rhonchi or rales.  Chest:     Chest wall: No tenderness.  Abdominal:     General: Abdomen is flat. There is no distension.     Palpations: Abdomen is soft. There is no mass or pulsatile mass.     Tenderness: There is no abdominal tenderness. There is right CVA tenderness. There is no left CVA tenderness, guarding or rebound.  Musculoskeletal:     Right lower leg: No edema.     Left lower leg: No edema.  Skin:    General: Skin is warm and dry.     Coloration: Skin is not jaundiced or pale.     Findings: No bruising, erythema, lesion or rash.  Neurological:     General: No focal deficit present.     Mental Status: He is alert and oriented to person, place, and time.     GCS: GCS eye subscore is 4. GCS verbal subscore is 5. GCS motor subscore is 6.  Psychiatric:        Mood and Affect: Mood normal.        Behavior: Behavior normal. Behavior is cooperative.     ED Results / Procedures / Treatments   Labs (all labs ordered are listed, but only abnormal results are displayed) Labs Reviewed  COMPREHENSIVE METABOLIC PANEL  CBC WITH DIFFERENTIAL/PLATELET  URINALYSIS, ROUTINE W REFLEX MICROSCOPIC  LIPASE, BLOOD    EKG None  Radiology CT Renal Stone Study  Result Date: 02/15/2022 CLINICAL DATA:  Right flank pain since last Wednesday. EXAM: CT ABDOMEN AND PELVIS WITHOUT CONTRAST TECHNIQUE: Multidetector CT imaging of the abdomen and pelvis was performed following the standard protocol without IV contrast. RADIATION DOSE REDUCTION: This exam was performed according to the departmental dose-optimization program which includes automated exposure control, adjustment of the mA and/or kV according to patient size and/or use of iterative  reconstruction technique. COMPARISON:  None Available. FINDINGS: Lower chest: The lung bases are clear. The imaged heart is unremarkable. Hepatobiliary: The liver and gallbladder are unremarkable. There is no biliary ductal dilatation. Pancreas: Unremarkable. Spleen: Unremarkable. Adrenals/Urinary Tract: The adrenals are unremarkable. The kidneys are unremarkable, with no focal lesion, stone, hydronephrosis, or hydroureter. No stone is seen along the course of either ureter. The bladder is unremarkable. Stomach/Bowel: The stomach is unremarkable. There is no evidence of bowel obstruction.  There is no abnormal bowel wall thickening or inflammatory change. The appendix is normal. Vascular/Lymphatic: Abdominal aorta is normal in course and caliber. There is no abdominopelvic lymphadenopathy. Reproductive: The prostate and seminal vesicles are unremarkable. Other: There is no ascites or free air. Musculoskeletal: There is no acute osseous abnormality or suspicious osseous lesion. IMPRESSION: No acute finding in the abdomen or pelvis. No renal stones or hydroureteronephrosis identified. Electronically Signed   By: Valetta Mole M.D.   On: 02/15/2022 14:36   DG Chest 2 View  Result Date: 02/15/2022 CLINICAL DATA:  Provided history: Right upper flank pain, pneumothorax. EXAM: CHEST - 2 VIEW COMPARISON:  Chest radiograph 12/21/2018. FINDINGS: Heart size within normal limits. No appreciable airspace consolidation. No evidence of pleural effusion or pneumothorax. No acute bony abnormality identified. IMPRESSION: No evidence of acute cardiopulmonary abnormality. Electronically Signed   By: Kellie Simmering D.O.   On: 02/15/2022 14:24    Procedures Procedures   Medications Ordered in ED Medications  lidocaine (LIDODERM) 5 % 1 patch (1 patch Transdermal Patch Applied 02/15/22 1917)  ketorolac (TORADOL) 30 MG/ML injection 30 mg (30 mg Intramuscular Given 02/15/22 1917)    ED Course/ Medical Decision Making/ A&P                            Medical Decision Making Amount and/or Complexity of Data Reviewed Labs: ordered. Radiology: ordered.  Risk Prescription drug management.   Patient is here with 2 to 3 days of right flank pain that is worse with movements.  He is in no acute distress and is stable vitals.  His work-up showed no leukocytosis, normal renal function, LFTs normal, lipase 30, no infectious or blood in the urine he had a completely normal. CT stone study without any evidence of appendicitis, gallbladder pathology, hydronephrosis, urolithiasis, or other abnormality.  Chest x-ray did not show any pneumothorax.  I suspect this is musculoskeletal.  He has been treated with IM Toradol and lidocaine patch.  I discussed supportive treatments at home and follow-up with PCP.  Final Clinical Impression(s) / ED Diagnoses Final diagnoses:  Right flank pain    Rx / DC Orders ED Discharge Orders          Ordered    naproxen (NAPROSYN) 500 MG tablet  2 times daily        02/15/22 1953    methocarbamol (ROBAXIN) 500 MG tablet  2 times daily        02/15/22 1953              Sheila Oats 02/15/22 2058    Dorie Rank, MD 02/16/22 [phone]

## 2022-02-15 NOTE — ED Triage Notes (Signed)
Patient c/o intermittent right flank pain x 5 days worse yesterday. Patient states the pain radiates to his abdomen at times.  Patient denies any urinary frequency, hematuria, hesitancy, or dysuria.

## 2022-02-23 ENCOUNTER — Ambulatory Visit: Payer: Commercial Managed Care - HMO | Admitting: Orthopaedic Surgery

## 2022-02-26 ENCOUNTER — Ambulatory Visit: Payer: Commercial Managed Care - HMO | Admitting: Orthopaedic Surgery

## 2022-03-04 ENCOUNTER — Ambulatory Visit (INDEPENDENT_AMBULATORY_CARE_PROVIDER_SITE_OTHER): Payer: Commercial Managed Care - HMO | Admitting: Orthopaedic Surgery

## 2022-03-04 ENCOUNTER — Encounter: Payer: Self-pay | Admitting: Orthopaedic Surgery

## 2022-03-04 ENCOUNTER — Telehealth: Payer: Self-pay | Admitting: Orthopaedic Surgery

## 2022-03-04 DIAGNOSIS — M20022 Boutonniere deformity of left finger(s): Secondary | ICD-10-CM | POA: Diagnosis not present

## 2022-03-04 DIAGNOSIS — M67813 Other specified disorders of tendon, right shoulder: Secondary | ICD-10-CM | POA: Insufficient documentation

## 2022-03-04 NOTE — Progress Notes (Signed)
Office Visit Note   Patient: Darin Hart           Date of Birth: 07-21-1970           MRN: [id number] Visit Date: 03/04/2022              Requested by: Horald Pollen, [place],  [place] [address] PCP: Horald Pollen, MD   Assessment & Plan: Visit Diagnoses:  1. Biceps tendonosis of right shoulder   2. 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  He has missed a few appointments recently for unknown reasons.  For the small finger he never followed up with Dr. Tempie Donning for this so I recommended that he can appointment with Dr. Tempie Donning at his new office.  We will see him back as needed.  Follow-Up Instructions: No follow-ups on file.   Orders:  No orders of the defined types were placed in this encounter.  No orders of the defined types were placed in this encounter.     Procedures: No procedures performed   Clinical Data: No additional findings.   Subjective: Chief Complaint  Patient presents with   Right Shoulder - Follow-up    Right shoulder biceps tenodesis 09/30/2021    HPI Darin Hart returns today for follow-up of his right shoulder status post biceps tenodesis.  Also has questions about his left small finger boutonniere deformity.  Review of Systems   Objective: Vital Signs: There were no vitals taken for this visit.  Physical Exam  Ortho Exam Examination of the right shoulder shows fully healed surgical scar.  Passive range of motion is basically back to normal.  He has adequate strength to manual muscle testing.  Left small finger exam is unchanged. Specialty Comments:  No specialty comments available.  Imaging: No results found.   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   Low back pain 01/23/2021   Chronic musculoskeletal pain 10/02/2020   History of asthma 10/02/2020   History of multiple allergies 10/02/2020   Low testosterone 02/07/2018   Past Medical History:  Diagnosis Date   Arthritis    knees   Asthma    Biceps rupture, proximal    right   Heart murmur    as child    Family History  Problem Relation Age of Onset   Hypertension Mother    Other Neg Hx        hypogonadism    Past Surgical History:  Procedure Laterality Date   BICEPT TENODESIS Right 09/30/2021   Procedure: RIGHT BICEPS TENODESIS;  Surgeon: Leandrew Koyanagi, MD;  Location: [facility];  Service: Orthopedics;  Laterality: Right;   HERNIA REPAIR     ORIF FINGER / THUMB FRACTURE Right    RHINOPLASTY     ROTATOR CUFF REPAIR Right    Spontaneous pneumothorax Left    20 yrs ago   Social History   Occupational History   Not on file  Tobacco Use   Smoking status: Never   Smokeless tobacco: Never  Vaping Use   Vaping Use: Never used  Substance and Sexual Activity   Alcohol use:  Yes    Comment: OCCASIONALLY   Drug use: Never   Sexual activity: Yes    Partners: Female

## 2022-03-04 NOTE — Telephone Encounter (Signed)
Patient would like a referral for PT to [facility].

## 2022-03-04 NOTE — Telephone Encounter (Signed)
Entered order

## 2022-03-16 ENCOUNTER — Ambulatory Visit: Payer: Commercial Managed Care - HMO

## 2022-03-16 NOTE — Therapy (Incomplete)
OUTPATIENT PHYSICAL THERAPY UPPER EXTREMITY EVALUATION   Patient Name: Darin Hart MRN: [id number] DOB:Feb 05, 1971, 51 y.o., male Today's Date: 03/16/2022    Past Medical History:  Diagnosis Date   Arthritis    knees   Asthma    Biceps rupture, proximal    right   Heart murmur    as child   Past Surgical History:  Procedure Laterality Date   BICEPT TENODESIS Right 09/30/2021   Procedure: RIGHT BICEPS TENODESIS;  Surgeon: Leandrew Koyanagi, MD;  Location: [facility];  Service: Orthopedics;  Laterality: Right;   HERNIA REPAIR     ORIF FINGER / THUMB FRACTURE Right    RHINOPLASTY     ROTATOR CUFF REPAIR Right    Spontaneous pneumothorax Left    20 yrs ago   Patient Active Problem List   Diagnosis Date Noted   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   Biceps rupture, proximal 12/08/2021   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  Rationale for Evaluation and Treatment Rehabilitation  ONSET DATE: 09/30/21 Traumatic Rupture of R biceps tendon  SUBJECTIVE:                                                                                                                                                                                      SUBJECTIVE STATEMENT: ***  PERTINENT HISTORY: ***  PAIN:  Are you having pain? {OPRCPAIN:27236}  PRECAUTIONS: {Therapy precautions:24002}  WEIGHT BEARING RESTRICTIONS: {Yes ***/No:24003}  FALLS:  Has patient fallen in last 6 months? {fallsyesno:27318}  LIVING ENVIRONMENT: Lives with: {OPRC lives with:25569::"lives with their  family"} Lives in: {Lives in:25570} Stairs: {opstairs:27293} Has following equipment at home: {Assistive devices:23999}  OCCUPATION: ***  PLOF: {PLOF:24004}  PATIENT GOALS: ***  OBJECTIVE:   DIAGNOSTIC FINDINGS:  IMPRESSION: Compared to 04/11/2021:   1. Mild anterior supraspinatus bursal sided tendinosis. 2. Unchanged tiny midsubstance partial-thickness tear of the infraspinatus tendon insertion. 3. Unchanged partial-thickness tearing of the articular side of the subscapularis tendon insertion. New full-thickness tear of the proximal biceps tendon origin with distal tendon retraction. The tendon is not visualized. 4. Mild degenerative changes of the acromioclavicular joint. Type 3 acromion.     Electronically Signed   By: Yvonne Kendall M.D.   On: 07/22/2021 16:16  PATIENT SURVEYS:  {rehab surveys:24030:a}  COGNITION: Overall cognitive status: {cognition:24006}     SENSATION: {sensation:27233}  POSTURE: ***  UPPER EXTREMITY ROM:   {AROM/PROM:27142} ROM Right eval Left eval  Shoulder flexion    Shoulder extension    Shoulder abduction    Shoulder adduction    Shoulder internal rotation    Shoulder external rotation    Elbow flexion    Elbow extension    Wrist flexion    Wrist extension    Wrist ulnar deviation    Wrist radial deviation    Wrist pronation    Wrist supination    (Blank rows = not tested)  UPPER EXTREMITY MMT:  MMT Right eval Left eval  Shoulder flexion    Shoulder extension    Shoulder abduction    Shoulder adduction    Shoulder internal rotation    Shoulder external rotation    Middle trapezius    Lower trapezius    Elbow flexion    Elbow extension    Wrist flexion    Wrist extension    Wrist ulnar deviation    Wrist radial deviation    Wrist pronation    Wrist supination    Grip strength (lbs)    (Blank rows = not tested)  SHOULDER SPECIAL TESTS: Impingement tests: {shoulder impingement test:25231:a} SLAP  lesions: {SLAP lesions:25232} Instability tests: {shoulder instability test:25233} Rotator cuff assessment: {rotator cuff assessment:25234} Biceps assessment: {biceps assessment:25235}  JOINT MOBILITY TESTING:  ***  PALPATION:  ***   TODAY'S TREATMENT:                                                                                                                                                    DATE:   03/16/22  PATIENT EDUCATION: Education details: *** Person educated: {Person educated:25204} Education method: {Education Method:25205} Education comprehension: {Education Comprehension:25206}  HOME EXERCISE PROGRAM: ***  ASSESSMENT:  CLINICAL IMPRESSION: Patient is a 51 y.o. male who was seen today for physical therapy evaluation and treatment for M67.813 (ICD-10-CM) - Biceps tendonosis of right shoulder.    OBJECTIVE IMPAIRMENTS: {opptimpairments:25111}.   ACTIVITY LIMITATIONS: {activitylimitations:27494}  PARTICIPATION LIMITATIONS: {participationrestrictions:25113}  PERSONAL FACTORS: {Personal factors:25162} are also affecting patient's functional outcome.   REHAB POTENTIAL: {rehabpotential:25112}  CLINICAL DECISION MAKING: {clinical decision making:25114}  EVALUATION COMPLEXITY: {Evaluation complexity:25115}  GOALS: Goals reviewed with patient? {yes/no:20286}  SHORT TERM GOALS: Target date: {follow up:25551}  (Remove Blue Hyperlink)  *** Baseline: Goal status: {GOALSTATUS:25110}  2.  *** Baseline:  Goal status: {GOALSTATUS:25110}  3.  *** Baseline:  Goal status: {GOALSTATUS:25110}  4.  *** Baseline:  Goal status: {GOALSTATUS:25110}  5.  *** Baseline:  Goal status: {GOALSTATUS:25110}  6.  *** Baseline:  Goal status: {GOALSTATUS:25110}  LONG TERM GOALS: Target date: {follow up:25551}  (Remove Blue Hyperlink)  *** Baseline:  Goal status: {GOALSTATUS:25110}  2.  ***  Baseline:  Goal status: {GOALSTATUS:25110}  3.  *** Baseline:  Goal  status: {GOALSTATUS:25110}  4.  *** Baseline:  Goal status: {GOALSTATUS:25110}  5.  *** Baseline:  Goal status: {GOALSTATUS:25110}  6.  *** Baseline:  Goal status: {GOALSTATUS:25110}  PLAN: PT FREQUENCY: {rehab frequency:25116}  PT DURATION: {rehab duration:25117}  PLANNED INTERVENTIONS: {rehab planned interventions:25118::"Therapeutic exercises","Therapeutic activity","Neuromuscular re-education","Balance training","Gait training","Patient/Family education","Self Care","Joint mobilization"}  PLAN FOR NEXT SESSION: ***   Rafan Sanders, PT 03/16/2022, 6:20 AM

## 2022-03-19 NOTE — Therapy (Unsigned)
OUTPATIENT PHYSICAL THERAPY SHOULDER EVALUATION   Patient Name: Darin Hart MRN: [id number] DOB:03-04-1971, 51 y.o., male Today's Date: 03/20/2022   PT End of Session - 03/20/22 1153     Visit Number 1    Number of Visits --   1-2x/week   Date for PT Re-Evaluation 05/15/22    Authorization Type [employer]    PT Start Time 1120    PT Stop Time 1153    PT Time Calculation (min) 33 min             Past Medical History:  Diagnosis Date   Arthritis    knees   Asthma    Biceps rupture, proximal    right   Heart murmur    as child   Past Surgical History:  Procedure Laterality Date   BICEPT TENODESIS Right 09/30/2021   Procedure: RIGHT BICEPS TENODESIS;  Surgeon: Tarry Kos, MD;  Location: [facility];  Service: Orthopedics;  Laterality: Right;   HERNIA REPAIR     ORIF FINGER / THUMB FRACTURE Right    RHINOPLASTY     ROTATOR CUFF REPAIR Right    Spontaneous pneumothorax Left    20 yrs ago   Patient Active Problem List   Diagnosis Date Noted   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   Biceps rupture, proximal 12/08/2021   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   Low back pain 01/23/2021   Chronic musculoskeletal pain 10/02/2020   History of asthma 10/02/2020   History of multiple allergies 10/02/2020   Low testosterone 02/07/2018    PCP: Georgina Quint, MD  REFERRING PROVIDER: Tarry Kos, MD  THERAPY DIAG:  Chronic right shoulder pain - Plan: PT plan of care cert/re-cert  Muscle weakness (generalized) - Plan: PT plan of care cert/re-cert  Localized edema - Plan: PT plan of care cert/re-cert  REFERRING DIAG: Biceps tendonosis of right shoulder [B01.751]  Rationale for Evaluation and Treatment:  Rehabilitation  SUBJECTIVE:  PERTINENT PAST HISTORY:  Biceps tenodesis 5/23        PRECAUTIONS: None  WEIGHT BEARING RESTRICTIONS No  FALLS:  Has patient fallen in last 6 months? No, Number of falls:  0  MOI/History of condition:  Onset date: 09/30/21  SUBJECTIVE STATEMENT  Darin Hart is a 51 y.o. male who presents to clinic with chief complaint of R shoulder pain and some burning of the biceps tenodesis following R/C repair and biceps tenodesis.  Pt reports that he was going to PT and felt like he was making good progress but was discharged, "I'm not sure why."  He reports he has continued working on his HEP but is having some pain with OH pressing and lateral raises.  From referring provider:   "10/12: Plan: In regards to the right shoulder I recommended going back to physical therapy to work on the last little bit of range of motion and strength.  He has missed a few appointments recently for unknown reasons.  For the small finger he never followed up with Dr. Frazier Butt for this so I recommended that he can appointment with Dr. Frazier Butt at his new office.  We will see him back as needed"   Red flags:  denies   Pain:  Are you having pain? Yes Pain location: posterior shoulder radiating to anterior-lateral raises NPRS scale:  current 0/10  average 8/10  Aggravating factors: sleeping, sweeping, work  NPRS, highest: 8/10 Relieving factors: rest  NPRS: best: 0/10 Pain description:  burning and aching Stage: Chronic Stability: getting better 24 hour pattern: worse at night and with activity   Occupation: Psychiatrist: NA  Hand Dominance: R  Patient Goals/Specific Activities: Get back to gym, reduce pain   OBJECTIVE:    GENERAL OBSERVATION:  Slight rounded shoulder     SENSATION:  Light touch: Appears intact   PALPATION: TTP infraspinatus, mild edema R shoulder   UPPER EXTREMITY AROM:  ROM Right 03/20/2022 Left 03/20/2022  Shoulder flexion WNL 95* WNL  Shoulder abduction WFL 90* WNL  Shoulder internal rotation    Shoulder external rotation    Functional IR L5 T10  Functional ER C7 T3  Shoulder extension    Elbow extension N N   Elbow flexion     (Blank rows = not tested, N = WNL, * = concordant pain with testing)  UPPER EXTREMITY MMT:  MMT Right 03/20/2022 Left 03/20/2022  Shoulder flexion 7* 15  Shoulder abduction (C5)    Shoulder ER 16 22  Shoulder IR    Middle trapezius 6.2 8.2  Lower trapezius 4.2* 5.8  Shoulder extension    Grip strength 34 44  Cervical flexion (C1,C2)    Cervical S/B (C3)    Shoulder shrug (C4)    Elbow flexion (C6)    Elbow ext (C7)    Thumb ext (C8)    Finger abd (T1)    Grossly     (Blank rows = not tested, score listed in lbs of force.  N = WNL, D = diminished, C = clear for gross weakness with myotome testing, * = concordant pain with testing)   MUSCLE LENGTH:    Pec Major: L (+), R (+*) for restriction  PATIENT SURVEYS:  FOTO 54 -> 71    TODAY'S TREATMENT:  Creating, reviewing, and completing below HEP    PATIENT EDUCATION:  POC, diagnosis, prognosis, HEP, and outcome measures.  Pt educated via explanation, demonstration, and handout (HEP).  Pt confirms understanding verbally.   HOME EXERCISE PROGRAM: Access Code: [email or web address] URL: [email or web address] Date: 03/20/2022 Prepared by: Shearon Balo  Exercises - Standing Shoulder Posterior Capsule Stretch  - 2 x daily - 7 x weekly - 3 sets - 1 reps - 45 hold - Doorway Pec Stretch at 60 Elevation  - 2 x daily - 7 x weekly - 3 sets - 1 reps - 45'' hold - Scaption with Resistance  - 1 x daily - 7 x weekly - 3 sets - 20 reps  ASTERISK SIGNS   Asterisk Signs Eval (03/20/2022)       R shoulder flexion Pain at 95 degrees       R shoulder MMT 7 lbs       ER MMT 16 lbs       Max pain 8/10       Poor mobility and pec length          ASSESSMENT:  CLINICAL IMPRESSION: Darin Hart is a 51 y.o. male who presents to clinic with signs and sxs consistent with R shoulder pain following R/C repair and biceps tenodesis in May of 2023.  Pt had limited PT visits after surgery and is now attempting to  return to working out.  He has significant shoulder pain at work.  He is generally hypo-mobile in his shoulder girdle.  He will benefit from skilled PT for load management, manual therapy and/or TDN, and progressive loading of R/C and biceps.    OBJECTIVE IMPAIRMENTS: Pain, R UE/shoulder strength, R shoulder ROM  ACTIVITY LIMITATIONS: lifting, reaching, working out, yardowrk  PERSONAL FACTORS: See medical history and pertinent history   REHAB POTENTIAL: Fair chronic condition  CLINICAL DECISION MAKING: Stable/uncomplicated  EVALUATION COMPLEXITY: Low   GOALS:   SHORT TERM GOALS: Target date: 04/17/2022  Darin Hart will be >75% HEP compliant to improve carryover between sessions and facilitate independent management of condition  Evaluation (03/20/2022): ongoing Goal status: INITIAL   LONG TERM GOALS: Target date: 05/15/2022  Darin Hart will improve FOTO score to 71 as a proxy for functional improvement  Evaluation/Baseline (03/20/2022): 54 Goal status: INITIAL   2.  Darin Hart will improve the following MMTs to within 15% of non-affected shoulder  Evaluation/Baseline (03/20/2022):   UPPER EXTREMITY MMT:  MMT Right 03/20/2022 Left 03/20/2022  Shoulder flexion 7* 15  Shoulder abduction (C5)    Shoulder ER 16 22  Shoulder IR    Middle trapezius 6.2 8.2  Lower trapezius 4.2* 5.8  Shoulder extension    Grip strength 34 44  Cervical flexion (C1,C2)    Cervical S/B (C3)    Shoulder shrug (C4)    Elbow flexion (C6)    Elbow ext (C7)    Thumb ext (C8)    Finger abd (T1)    Grossly     (Blank rows = not tested, score listed in lbs of force.  N = WNL, D = diminished, C = clear for gross weakness with myotome testing, * = concordant pain with testing)  Goal status: INITIAL   3.  Darin Hart will self report >/= 50% decrease in pain from evaluation   Evaluation/Baseline (03/20/2022): 8/10 max pain Goal status: INITIAL   4.  Darin Hart will be able to  return to gym, not limited by pain  Evaluation/Baseline (03/20/2022): limited Goal status: INITIAL   5.  Darin Hart will demonstrate >130 degrees of pain free active ROM in flexion to allow completion of activities involving reaching OH, not limited by pain  Evaluation/Baseline (03/20/2022): 95 degrees with pain Goal status: INITIAL    PLAN: PT FREQUENCY: 1-2x/week  PT DURATION: 8 weeks (Ending 05/15/2022)  PLANNED INTERVENTIONS: Therapeutic exercises, Aquatic therapy, Therapeutic activity, Neuro Muscular re-education, Gait training, Patient/Family education, Joint mobilization, Dry Needling, Electrical stimulation, Spinal mobilization and/or manipulation, Moist heat, Taping, Vasopneumatic device, Ionotophoresis 4mg /ml Dexamethasone, and Manual therapy  PLAN FOR NEXT SESSION: progressive R shoulder/elbow ROM and strengthening    PT, DPT 03/20/2022, 11:59 AM

## 2022-03-20 ENCOUNTER — Other Ambulatory Visit: Payer: Self-pay

## 2022-03-20 ENCOUNTER — Encounter: Payer: Self-pay | Admitting: Physical Therapy

## 2022-03-20 ENCOUNTER — Ambulatory Visit: Payer: Commercial Managed Care - HMO | Attending: Orthopaedic Surgery | Admitting: Physical Therapy

## 2022-03-20 DIAGNOSIS — M67813 Other specified disorders of tendon, right shoulder: Secondary | ICD-10-CM | POA: Diagnosis not present

## 2022-03-20 DIAGNOSIS — R600 Localized edema: Secondary | ICD-10-CM | POA: Diagnosis present

## 2022-03-20 DIAGNOSIS — M25511 Pain in right shoulder: Secondary | ICD-10-CM | POA: Diagnosis present

## 2022-03-20 DIAGNOSIS — G8929 Other chronic pain: Secondary | ICD-10-CM | POA: Insufficient documentation

## 2022-03-20 DIAGNOSIS — M6281 Muscle weakness (generalized): Secondary | ICD-10-CM | POA: Diagnosis present

## 2022-03-30 ENCOUNTER — Ambulatory Visit: Payer: Commercial Managed Care - HMO | Admitting: Physical Therapy

## 2022-03-30 NOTE — Therapy (Deleted)
OUTPATIENT PHYSICAL THERAPY TREATMENT NOTE   Patient Name: Darin Hart MRN: [id number] DOB:19-Oct-1970, 51 y.o., male Today's Date: 03/30/2022  PCP: Horald Pollen, MD   REFERRING PROVIDER: Leandrew Koyanagi, MD    Past Medical History:  Diagnosis Date   Arthritis    knees   Asthma    Biceps rupture, proximal    right   Heart murmur    as child   Past Surgical History:  Procedure Laterality Date   BICEPT TENODESIS Right 09/30/2021   Procedure: RIGHT BICEPS TENODESIS;  Surgeon: Leandrew Koyanagi, MD;  Location: [facility];  Service: Orthopedics;  Laterality: Right;   HERNIA REPAIR     ORIF FINGER / THUMB FRACTURE Right    RHINOPLASTY     ROTATOR CUFF REPAIR Right    Spontaneous pneumothorax Left    20 yrs ago   Patient Active Problem List   Diagnosis Date Noted   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   Biceps rupture, proximal 12/08/2021   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   Rationale for Evaluation and Treatment Rehabilitation  REFERRING DIAG: Biceps tendonosis of right shoulder [M67.813]   PERTINENT HISTORY: Biceps tenodesis and R/C repair 5/23    PRECAUTIONS/RESTRICTIONS:   none  SUBJECTIVE:  ***  Pain:  Are you having pain? Yes Pain location: posterior shoulder radiating to anterior-lateral raises NPRS scale:  current 0/10  Aggravating factors: sleeping, sweeping, work Relieving factors: rest Pain description: burning and aching Stage: Chronic 24 hour pattern: worse at night and with activity   OBJECTIVE: (objective measures completed at initial evaluation unless otherwise dated)    UPPER EXTREMITY AROM:   ROM Right 03/20/2022 Left 03/20/2022  Shoulder flexion WNL 95* WNL   Shoulder abduction WFL 90* WNL  Shoulder internal rotation      Shoulder external rotation      Functional IR L5 T10  Functional ER C7 T3  Shoulder extension      Elbow extension N N  Elbow flexion        (Blank rows = not tested, N = WNL, * = concordant pain with testing)   UPPER EXTREMITY MMT:   MMT Right 03/20/2022 Left 03/20/2022  Shoulder flexion 7* 15  Shoulder abduction (C5)      Shoulder ER 16 22  Shoulder IR      Middle trapezius 6.2 8.2  Lower trapezius 4.2* 5.8  Shoulder extension      Grip strength 34 44  Cervical flexion (C1,C2)      Cervical S/B (C3)      Shoulder shrug (C4)      Elbow flexion (C6)      Elbow ext (C7)      Thumb ext (C8)      Finger abd (T1)      Grossly         HOME EXERCISE PROGRAM: Access Code: [email or web address] URL: [email or web address] Date: 03/20/2022 Prepared by: Shearon Balo   Exercises - Standing Shoulder Posterior Capsule Stretch  - 2 x daily - 7 x weekly - 3 sets - 1 reps - 45 hold - Doorway Pec Stretch at 60 Elevation  - 2 x daily - 7 x weekly - 3 sets - 1 reps - 45'' hold -  Scaption with Resistance  - 1 x daily - 7 x weekly - 3 sets - 20 reps   ASTERISK SIGNS     Asterisk Signs Eval (03/20/2022)            R shoulder flexion Pain at 95 degrees            R shoulder MMT 7 lbs            ER MMT 16 lbs            Max pain 8/10            Poor mobility and pec length                 TREATMENT ***:  Therapeutic Exercise: - ***  Manual Therapy: - ***  Neuromuscular re-ed: - ***  Therapeutic Activity: - ***  Self-care/Home Management: - ***  CLINICAL IMPRESSION: Darin Hart is a 51 y.o. male who presents to clinic with signs and sxs consistent with R shoulder pain following R/C repair and biceps tenodesis in May of 2023.  Pt had limited PT visits after surgery and is now attempting to return to working out.  He has significant shoulder pain at work.  He is generally hypo-mobile in his shoulder  girdle.  He will benefit from skilled PT for load management, manual therapy and/or TDN, and progressive loading of R/C and biceps.     OBJECTIVE IMPAIRMENTS: Pain, R UE/shoulder strength, R shoulder ROM   ACTIVITY LIMITATIONS: lifting, reaching, working out, yardowrk   PERSONAL FACTORS: See medical history and pertinent history     REHAB POTENTIAL: Fair chronic condition   CLINICAL DECISION MAKING: Stable/uncomplicated   EVALUATION COMPLEXITY: Low     GOALS:     SHORT TERM GOALS: Target date: 04/17/2022   Darin Hart will be >75% HEP compliant to improve carryover between sessions and facilitate independent management of condition   Evaluation (03/20/2022): ongoing Goal status: INITIAL     LONG TERM GOALS: Target date: 05/15/2022   Darin Hart will improve FOTO score to 71 as a proxy for functional improvement   Evaluation/Baseline (03/20/2022): 54 Goal status: INITIAL     2.  Darin Hart will improve the following MMTs to within 15% of non-affected shoulder   Evaluation/Baseline (03/20/2022):    UPPER EXTREMITY MMT:   MMT Right 03/20/2022 Left 03/20/2022  Shoulder flexion 7* 15  Shoulder abduction (C5)      Shoulder ER 16 22  Shoulder IR      Middle trapezius 6.2 8.2  Lower trapezius 4.2* 5.8  Shoulder extension      Grip strength 34 44  Cervical flexion (C1,C2)      Cervical S/B (C3)      Shoulder shrug (C4)      Elbow flexion (C6)      Elbow ext (C7)      Thumb ext (C8)      Finger abd (T1)      Grossly        (Blank rows = not tested, score listed in lbs of force.  N = WNL, D = diminished, C = clear for gross weakness with myotome testing, * = concordant pain with testing)   Goal status: INITIAL     3.  Darin Hart will self report >/= 50% decrease in pain from evaluation    Evaluation/Baseline (03/20/2022): 8/10 max pain Goal status: INITIAL     4.  Darin Hart will be able to return to gym, not limited by pain  Evaluation/Baseline  (03/20/2022): limited Goal status: INITIAL     5.  Darin Hart will demonstrate >130 degrees of pain free active ROM in flexion to allow completion of activities involving reaching OH, not limited by pain   Evaluation/Baseline (03/20/2022): 95 degrees with pain Goal status: INITIAL       PLAN: PT FREQUENCY: 1-2x/week   PT DURATION: 8 weeks (Ending 05/15/2022)   PLANNED INTERVENTIONS: Therapeutic exercises, Aquatic therapy, Therapeutic activity, Neuro Muscular re-education, Gait training, Patient/Family education, Joint mobilization, Dry Needling, Electrical stimulation, Spinal mobilization and/or manipulation, Moist heat, Taping, Vasopneumatic device, Ionotophoresis 4mg /ml Dexamethasone, and Manual therapy   PLAN FOR NEXT SESSION: progressive R shoulder/elbow ROM and strengthening    Narissa Beaufort E Leighton Luster PT 03/30/2022, 11:13 AM

## 2022-04-06 ENCOUNTER — Ambulatory Visit: Payer: Commercial Managed Care - HMO | Admitting: Physical Therapy

## 2022-04-06 NOTE — Therapy (Deleted)
OUTPATIENT PHYSICAL THERAPY TREATMENT NOTE   Patient Name: Darin Hart MRN: [id number] DOB:Jun 15, 1970, 51 y.o., male Today's Date: 04/06/2022  PCP: Georgina Quint, MD   REFERRING PROVIDER: Tarry Kos, MD    Past Medical History:  Diagnosis Date   Arthritis    knees   Asthma    Biceps rupture, proximal    right   Heart murmur    as child   Past Surgical History:  Procedure Laterality Date   BICEPT TENODESIS Right 09/30/2021   Procedure: RIGHT BICEPS TENODESIS;  Surgeon: Tarry Kos, MD;  Location: [facility];  Service: Orthopedics;  Laterality: Right;   HERNIA REPAIR     ORIF FINGER / THUMB FRACTURE Right    RHINOPLASTY     ROTATOR CUFF REPAIR Right    Spontaneous pneumothorax Left    20 yrs ago   Patient Active Problem List   Diagnosis Date Noted   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   Biceps rupture, proximal 12/08/2021   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   Rationale for Evaluation and Treatment Rehabilitation  REFERRING DIAG: Biceps tendonosis of right shoulder [M67.813]   PERTINENT HISTORY: Biceps tenodesis 5/23 and R/C repair  PRECAUTIONS/RESTRICTIONS:   Biceps tenodesis 5/23 and R/C repair  SUBJECTIVE:  ***  Pain:  Are you having pain? Yes Pain location: posterior shoulder radiating to anterior-lateral raises NPRS scale:  current 0/10  average 8/10  Aggravating factors: sleeping, sweeping, work           NPRS, highest: 8/10 Relieving factors: rest           NPRS: best: 0/10 Pain description: burning and aching Stage: Chronic Stability: getting better 24 hour pattern: worse at night and with activity   OBJECTIVE: (objective measures completed at initial  evaluation unless otherwise dated)  GENERAL OBSERVATION:          Slight rounded shoulder                               SENSATION:          Light touch: Appears intact           PALPATION: TTP infraspinatus, mild edema R shoulder    UPPER EXTREMITY AROM:   ROM Right 03/20/2022 Left 03/20/2022  Shoulder flexion WNL 95* WNL  Shoulder abduction WFL 90* WNL  Shoulder internal rotation      Shoulder external rotation      Functional IR L5 T10  Functional ER C7 T3  Shoulder extension      Elbow extension N N  Elbow flexion        (Blank rows = not tested, N = WNL, * = concordant pain with testing)   UPPER EXTREMITY MMT:   MMT Right 03/20/2022 Left 03/20/2022  Shoulder flexion 7* 15  Shoulder abduction (C5)      Shoulder ER 16 22  Shoulder IR      Middle trapezius 6.2 8.2  Lower trapezius 4.2* 5.8  Shoulder extension      Grip strength 34 44  Cervical flexion (C1,C2)      Cervical S/B (C3)      Shoulder shrug (C4)  Elbow flexion (C6)      Elbow ext (C7)      Thumb ext (C8)      Finger abd (T1)      Grossly        (Blank rows = not tested, score listed in lbs of force.  N = WNL, D = diminished, C = clear for gross weakness with myotome testing, * = concordant pain with testing)             MUSCLE LENGTH:                      Pec Major: L (+), R (+*) for restriction   PATIENT SURVEYS:  FOTO 54 -> 71              TODAY'S TREATMENT:  Creating, reviewing, and completing below HEP       PATIENT EDUCATION:  POC, diagnosis, prognosis, HEP, and outcome measures.  Pt educated via explanation, demonstration, and handout (HEP).  Pt confirms understanding verbally.    HOME EXERCISE PROGRAM: Access Code: [email or web address] URL: [email or web address] Date: 03/20/2022 Prepared by: Darin Hart   Exercises - Standing Shoulder Posterior Capsule Stretch  - 2 x daily - 7 x weekly - 3 sets - 1 reps - 45 hold - Doorway Pec Stretch at 60 Elevation  - 2 x  daily - 7 x weekly - 3 sets - 1 reps - 45'' hold - Scaption with Resistance  - 1 x daily - 7 x weekly - 3 sets - 20 reps   ASTERISK SIGNS     Asterisk Signs Eval (03/20/2022)            R shoulder flexion Pain at 95 degrees            R shoulder MMT 7 lbs            ER MMT 16 lbs            Max pain 8/10            Poor mobility and pec length                 TREATMENT ***:  Therapeutic Exercise: - ***  Manual Therapy: - ***  Neuromuscular re-ed: - ***  Therapeutic Activity: - ***  Self-care/Home Management: - ***  ASSESSMENT:   CLINICAL IMPRESSION: ***    OBJECTIVE IMPAIRMENTS: Pain, R UE/shoulder strength, R shoulder ROM   ACTIVITY LIMITATIONS: lifting, reaching, working out, yardowrk   PERSONAL FACTORS: See medical history and pertinent history     REHAB POTENTIAL: Fair chronic condition   CLINICAL DECISION MAKING: Stable/uncomplicated   EVALUATION COMPLEXITY: Low     GOALS:     SHORT TERM GOALS: Target date: 04/17/2022   Darin Hart will be >75% HEP compliant to improve carryover between sessions and facilitate independent management of condition   Evaluation (03/20/2022): ongoing Goal status: INITIAL     LONG TERM GOALS: Target date: 05/15/2022   Darin Hart will improve FOTO score to 71 as a proxy for functional improvement   Evaluation/Baseline (03/20/2022): 54 Goal status: INITIAL     2.  Darin Hart will improve the following MMTs to within 15% of non-affected shoulder   Evaluation/Baseline (03/20/2022):    UPPER EXTREMITY MMT:   MMT Right 03/20/2022 Left 03/20/2022  Shoulder flexion 7* 15  Shoulder abduction (C5)      Shoulder ER 16 22  Shoulder IR      Middle trapezius 6.2 8.2  Lower  trapezius 4.2* 5.8  Shoulder extension      Grip strength 34 44  Cervical flexion (C1,C2)      Cervical S/B (C3)      Shoulder shrug (C4)      Elbow flexion (C6)      Elbow ext (C7)      Thumb ext (C8)      Finger abd (T1)       Grossly        (Blank rows = not tested, score listed in lbs of force.  N = WNL, D = diminished, C = clear for gross weakness with myotome testing, * = concordant pain with testing)   Goal status: INITIAL     3.  Darin Hart will self report >/= 50% decrease in pain from evaluation    Evaluation/Baseline (03/20/2022): 8/10 max pain Goal status: INITIAL     4.  Darin Hart will be able to return to gym, not limited by pain   Evaluation/Baseline (03/20/2022): limited Goal status: INITIAL     5.  Darin Hart will demonstrate >130 degrees of pain free active ROM in flexion to allow completion of activities involving reaching OH, not limited by pain   Evaluation/Baseline (03/20/2022): 95 degrees with pain Goal status: INITIAL       PLAN: PT FREQUENCY: 1-2x/week   PT DURATION: 8 weeks (Ending 05/15/2022)   PLANNED INTERVENTIONS: Therapeutic exercises, Aquatic therapy, Therapeutic activity, Neuro Muscular re-education, Gait training, Patient/Family education, Joint mobilization, Dry Needling, Electrical stimulation, Spinal mobilization and/or manipulation, Moist heat, Taping, Vasopneumatic device, Ionotophoresis 4mg /ml Dexamethasone, and Manual therapy   PLAN FOR NEXT SESSION: progressive R shoulder/elbow ROM and strengthening    Darin Hart PT 04/06/2022, 10:10 AM

## 2022-04-07 NOTE — Therapy (Incomplete)
OUTPATIENT PHYSICAL THERAPY TREATMENT NOTE   Patient Name: Darin Hart MRN: [id number] DOB:Jul 27, 1970, 51 y.o., male Today's Date: 04/07/2022  PCP:  Georgina Quint, MD  REFERRING PROVIDER: Tarry Kos, MD   END OF SESSION:    Past Medical History:  Diagnosis Date   Arthritis    knees   Asthma    Biceps rupture, proximal    right   Heart murmur    as child   Past Surgical History:  Procedure Laterality Date   BICEPT TENODESIS Right 09/30/2021   Procedure: RIGHT BICEPS TENODESIS;  Surgeon: Tarry Kos, MD;  Location:  [facility];  Service: Orthopedics;  Laterality: Right;   HERNIA REPAIR     ORIF FINGER / THUMB FRACTURE Right    RHINOPLASTY     ROTATOR CUFF REPAIR Right    Spontaneous pneumothorax Left    20 yrs ago   Patient Active Problem List   Diagnosis Date Noted   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   Biceps rupture, proximal 12/08/2021   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  Rationale for Evaluation and Treatment Rehabilitation  PERTINENT HISTORY: Biceps tenodesis 5/23     PRECAUTIONS: None  SUBJECTIVE:                                                                                                                                                                                      SUBJECTIVE STATEMENT:  ***   PAIN:  Are you having pain? Yes Pain location: posterior shoulder radiating to anterior-lateral raises NPRS scale:  current ***0/10  average 8/10  Aggravating factors: sleeping, sweeping, work           NPRS, highest: 8/10 Relieving factors: rest           NPRS: best: 0/10 Pain description: burning and  aching Stage: Chronic Stability: getting better 24 hour pattern: worse at night and with activity    OBJECTIVE: (objective measures completed at initial evaluation unless otherwise dated)   GENERAL OBSERVATION:          Slight rounded shoulder                               SENSATION:          Light touch: Appears intact  PALPATION: TTP infraspinatus, mild edema R shoulder    UPPER EXTREMITY AROM:   ROM Right 03/20/2022 Left 03/20/2022  Shoulder flexion WNL 95* WNL  Shoulder abduction WFL 90* WNL  Shoulder internal rotation      Shoulder external rotation      Functional IR L5 T10  Functional ER C7 T3  Shoulder extension      Elbow extension N N  Elbow flexion        (Blank rows = not tested, N = WNL, * = concordant pain with testing)   UPPER EXTREMITY MMT:   MMT Right 03/20/2022 Left 03/20/2022  Shoulder flexion 7* 15  Shoulder abduction (C5)      Shoulder ER 16 22  Shoulder IR      Middle trapezius 6.2 8.2  Lower trapezius 4.2* 5.8  Shoulder extension      Grip strength 34 44  Cervical flexion (C1,C2)      Cervical S/B (C3)      Shoulder shrug (C4)      Elbow flexion (C6)      Elbow ext (C7)      Thumb ext (C8)      Finger abd (T1)      Grossly        (Blank rows = not tested, score listed in lbs of force.  N = WNL, D = diminished, C = clear for gross weakness with myotome testing, * = concordant pain with testing)             MUSCLE LENGTH:                      Pec Major: L (+), R (+*) for restriction   PATIENT SURVEYS:  FOTO 54 -> 71              TODAY'S TREATMENT:  Creating, reviewing, and completing below HEP       PATIENT EDUCATION:  POC, diagnosis, prognosis, HEP, and outcome measures.  Pt educated via explanation, demonstration, and handout (HEP).  Pt confirms understanding verbally.    HOME EXERCISE PROGRAM: Access Code: [email or web address] URL: [email or web address] Date: 03/20/2022 Prepared by: Alphonzo Severance    Exercises - Standing Shoulder Posterior Capsule Stretch  - 2 x daily - 7 x weekly - 3 sets - 1 reps - 45 hold - Doorway Pec Stretch at 60 Elevation  - 2 x daily - 7 x weekly - 3 sets - 1 reps - 45'' hold - Scaption with Resistance  - 1 x daily - 7 x weekly - 3 sets - 20 reps   TREATMENT 04/08/22: Therapeutic Exercise: UBE level 1 3/3 fwd/bwd Posterior capsule cross body shoulder stretch Pec stretch doorway Standing scaption back against wall  Rows Shoulder extension Pball roll up wall with alternating UE lift off Prone Rt flexion Prone Rt extension Prone Rt horizontal abduction Seated bicep curl 3# 3x10 Manual Therapy: STM to Rt biceps   ASTERISK SIGNS     Asterisk Signs Eval (03/20/2022)            R shoulder flexion Pain at 95 degrees            R shoulder MMT 7 lbs            ER MMT 16 lbs            Max pain 8/10            Poor mobility and pec length  ASSESSMENT:   CLINICAL IMPRESSION: ***  Darin Hart is a 51 y.o. male who presents to clinic with signs and sxs consistent with R shoulder pain following R/C repair and biceps tenodesis in May of 2023.  Pt had limited PT visits after surgery and is now attempting to return to working out.  He has significant shoulder pain at work.  He is generally hypo-mobile in his shoulder girdle.  He will benefit from skilled PT for load management, manual therapy and/or TDN, and progressive loading of R/C and biceps.     OBJECTIVE IMPAIRMENTS: Pain, R UE/shoulder strength, R shoulder ROM   ACTIVITY LIMITATIONS: lifting, reaching, working out, yardowrk   PERSONAL FACTORS: See medical history and pertinent history     REHAB POTENTIAL: Fair chronic condition   CLINICAL DECISION MAKING: Stable/uncomplicated   EVALUATION COMPLEXITY: Low     GOALS:     SHORT TERM GOALS: Target date: 04/17/2022   Darin Hart will be >75% HEP compliant to improve carryover between sessions and facilitate independent  management of condition   Evaluation (03/20/2022): ongoing Goal status: INITIAL     LONG TERM GOALS: Target date: 05/15/2022   Darin Hart will improve FOTO score to 71 as a proxy for functional improvement   Evaluation/Baseline (03/20/2022): 54 Goal status: INITIAL     2.  Darin Hart will improve the following MMTs to within 15% of non-affected shoulder   Evaluation/Baseline (03/20/2022):    UPPER EXTREMITY MMT:   MMT Right 03/20/2022 Left 03/20/2022  Shoulder flexion 7* 15  Shoulder abduction (C5)      Shoulder ER 16 22  Shoulder IR      Middle trapezius 6.2 8.2  Lower trapezius 4.2* 5.8  Shoulder extension      Grip strength 34 44  Cervical flexion (C1,C2)      Cervical S/B (C3)      Shoulder shrug (C4)      Elbow flexion (C6)      Elbow ext (C7)      Thumb ext (C8)      Finger abd (T1)      Grossly        (Blank rows = not tested, score listed in lbs of force.  N = WNL, D = diminished, C = clear for gross weakness with myotome testing, * = concordant pain with testing)   Goal status: INITIAL     3.  Darin Hart will self report >/= 50% decrease in pain from evaluation    Evaluation/Baseline (03/20/2022): 8/10 max pain Goal status: INITIAL     4.  Darin Hart will be able to return to gym, not limited by pain   Evaluation/Baseline (03/20/2022): limited Goal status: INITIAL     5.  Darin Hart will demonstrate >130 degrees of pain free active ROM in flexion to allow completion of activities involving reaching OH, not limited by pain   Evaluation/Baseline (03/20/2022): 95 degrees with pain Goal status: INITIAL       PLAN: PT FREQUENCY: 1-2x/week   PT DURATION: 8 weeks (Ending 05/15/2022)   PLANNED INTERVENTIONS: Therapeutic exercises, Aquatic therapy, Therapeutic activity, Neuro Muscular re-education, Gait training, Patient/Family education, Joint mobilization, Dry Needling, Electrical stimulation, Spinal mobilization and/or manipulation, Moist  heat, Taping, Vasopneumatic device, Ionotophoresis 4mg /ml Dexamethasone, and Manual therapy   PLAN FOR NEXT SESSION: progressive R shoulder/elbow ROM and strengthening    , PTA 04/07/2022, 4:18 PM

## 2022-04-08 ENCOUNTER — Ambulatory Visit: Payer: Commercial Managed Care - HMO

## 2022-04-08 NOTE — Therapy (Signed)
OUTPATIENT PHYSICAL THERAPY TREATMENT NOTE   Patient Name: Darin Hart MRN: [id number] DOB:[birth year], 51 y.o., male Today's Date: 04/09/2022  PCP: Georgina Quint, MD  REFERRING PROVIDER: Tarry Kos, MD   END OF SESSION:   PT End of Session - 04/09/22 [phone]     Visit Number 2    Number of Visits --   1-2x/week   Authorization Type [employer]    Progress Note Due on Visit 10    PT Start Time 0806    PT Stop Time 0848    PT Time Calculation (min) 42 min    Activity Tolerance Patient tolerated treatment well    Behavior During Therapy [facility] for tasks assessed/performed             Past Medical History:  Diagnosis Date   Arthritis    knees   Asthma    Biceps rupture, proximal    right   Heart murmur    as child   Past Surgical History:  Procedure Laterality Date   BICEPT TENODESIS Right 09/30/2021   Procedure: RIGHT BICEPS TENODESIS;  Surgeon: Tarry Kos, MD;  Location: [facility];  Service: Orthopedics;  Laterality: Right;   HERNIA REPAIR     ORIF FINGER / THUMB FRACTURE Right    RHINOPLASTY     ROTATOR CUFF REPAIR Right    Spontaneous pneumothorax Left    20 yrs ago   Patient Active Problem List   Diagnosis Date Noted   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   Biceps rupture, proximal 12/08/2021   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 No, Number of falls: 0   MOI/History of condition:   Onset date: 09/30/21   SUBJECTIVE STATEMENT Pt reports his R shoulder is feeling good today. Pt reports pain nearing 90d of shoulder elevation with abd > flex. With HEP pt notes discomfort with initial reps and lessens as he warms up. pt reports consistent completion of his HEP.  From referring provider:    "10/12: Plan: In regards to the right shoulder I recommended going back to physical therapy to work on the last little bit of range of motion and strength.  He has missed a few appointments recently for unknown reasons.  For the small finger he never followed up with Dr. Frazier Butt for this so I recommended that he can appointment with Dr. Frazier Butt at his new office.  We will see him back as needed"             Red flags:  denies    Pain:  Are you having pain? Yes Pain location: posterior shoulder radiating to anterior-lateral raises NPRS scale:  current 0/10  average 8/10  Aggravating factors: sleeping, sweeping, work           NPRS, highest: 8/10 Relieving factors: rest  NPRS: best: 0/10 Pain description: burning and aching Stage: Chronic Stability: getting better 24 hour pattern: worse at night and with activity    Occupation: Scientist, clinical (histocompatibility and immunogenetics): NA   Hand Dominance: R   Patient Goals/Specific Activities: Get back to gym, reduce pain     OBJECTIVE: (objective measures completed at initial evaluation unless otherwise dated)      GENERAL OBSERVATION:          Slight rounded shoulder                               SENSATION:          Light touch: Appears intact           PALPATION: TTP infraspinatus, mild edema R shoulder    UPPER EXTREMITY AROM:   ROM Right 03/20/2022 Left 03/20/2022  Shoulder flexion WNL 95* WNL  Shoulder abduction WFL 90* WNL  Shoulder internal rotation      Shoulder external rotation      Functional IR L5 T10  Functional ER C7 T3   Shoulder extension      Elbow extension N N  Elbow flexion        (Blank rows = not tested, N = WNL, * = concordant pain with testing)   UPPER EXTREMITY MMT:   MMT Right 03/20/2022 Left 03/20/2022  Shoulder flexion 7* 15  Shoulder abduction (C5)      Shoulder ER 16 22  Shoulder IR      Middle trapezius 6.2 8.2  Lower trapezius 4.2* 5.8  Shoulder extension      Grip strength 34 44  Cervical flexion (C1,C2)      Cervical S/B (C3)      Shoulder shrug (C4)      Elbow flexion (C6)      Elbow ext (C7)      Thumb ext (C8)      Finger abd (T1)      Grossly        (Blank rows = not tested, score listed in lbs of force.  N = WNL, D = diminished, C = clear for gross weakness with myotome testing, * = concordant pain with testing)             MUSCLE LENGTH:                      Pec Major: L (+), R (+*) for restriction   PATIENT SURVEYS:  FOTO 54 -> 71              TODAY'S TREATMENT:  [facility] Adult PT Treatment:                                                DATE: 04/08/22 Therapeutic Exercise: Supine posterior shoulder stretch x3 30" SL R Shoulder ER 3# 3x10 SL R shoulder abd 3# 3x10 Standing shoulder row 2x15 Standing shoulder ext 2x15 Shoulder shoulder scaption 3# 2x10 Modalities: GH jt AP grade 3 mobs for posterior capsule and grade 3 distractions Self Care: Cross friction massage to scar tissue  Eval: Creating, reviewing, and completing below HEP       PATIENT EDUCATION:  POC, diagnosis, prognosis, HEP, and outcome measures.  Pt educated via explanation, demonstration, and handout (HEP).  Pt confirms understanding  verbally.    HOME EXERCISE PROGRAM: Access Code: [email or web address] URL: [email or web address] Date: 03/20/2022 Prepared by: Alphonzo SeveranceKarl Reinhartsen   Exercises - Standing Shoulder Posterior Capsule Stretch  - 2 x daily - 7 x weekly - 3 sets - 1 reps - 45 hold - Doorway Pec Stretch at 60 Elevation  - 2 x daily - 7 x weekly - 3 sets - 1 reps - 45''  hold - Scaption with Resistance  - 1 x daily - 7 x weekly - 3 sets - 20 reps   ASTERISK SIGNS     Asterisk Signs Eval (03/20/2022)            R shoulder flexion Pain at 95 degrees            R shoulder MMT 7 lbs            ER MMT 16 lbs            Max pain 8/10            Poor mobility and pec length                  ASSESSMENT:   CLINICAL IMPRESSION: Pt was completed for ROM/stretching of posterior R shoulder and for R/C and periscapular strengthening. Pt was also instructed in cross friction massage of the anterior shoulder scar to improve tightness the pt experiences there. Pt tolerated PT today without adverse effects. Pt experiences R shoulder discomfort most often near 90d of shoulder elevation when the demand on the R/C is greater. Pt will continue to benefit from skilled PT to address impairments for improved R shoulder function with less pain.   OBJECTIVE IMPAIRMENTS: Pain, R UE/shoulder strength, R shoulder ROM   ACTIVITY LIMITATIONS: lifting, reaching, working out, yardowrk   PERSONAL FACTORS: See medical history and pertinent history     REHAB POTENTIAL: Fair chronic condition   CLINICAL DECISION MAKING: Stable/uncomplicated   EVALUATION COMPLEXITY: Low     GOALS:     SHORT TERM GOALS: Target date: 04/17/2022   Cristal DeerChristopher will be >75% HEP compliant to improve carryover between sessions and facilitate independent management of condition   Evaluation (03/20/2022): ongoing Goal status: INITIAL     LONG TERM GOALS: Target date: 05/15/2022   Cristal DeerChristopher will improve FOTO score to 71 as a proxy for functional improvement   Evaluation/Baseline (03/20/2022): 54 Goal status: INITIAL     2.  Cristal DeerChristopher will improve the following MMTs to within 15% of non-affected shoulder   Evaluation/Baseline (03/20/2022):    UPPER EXTREMITY MMT:   MMT Right 03/20/2022 Left 03/20/2022  Shoulder flexion 7* 15  Shoulder abduction (C5)      Shoulder ER 16 22  Shoulder  IR      Middle trapezius 6.2 8.2  Lower trapezius 4.2* 5.8  Shoulder extension      Grip strength 34 44  Cervical flexion (C1,C2)      Cervical S/B (C3)      Shoulder shrug (C4)      Elbow flexion (C6)      Elbow ext (C7)      Thumb ext (C8)      Finger abd (T1)      Grossly        (Blank rows = not tested, score listed in lbs of force.  N = WNL, D = diminished, C = clear for gross weakness with myotome testing, * = concordant pain with testing)   Goal status: INITIAL     3.  Harald will self report >/= 50% decrease in pain from evaluation    Evaluation/Baseline (03/20/2022): 8/10 max pain Goal status: INITIAL     4.  Caelen will be able to return to gym, not limited by pain   Evaluation/Baseline (03/20/2022): limited Goal status: INITIAL     5.  Zohar will demonstrate >130 degrees of pain free active ROM in flexion to allow completion of activities involving reaching OH, not limited by pain   Evaluation/Baseline (03/20/2022): 95 degrees with pain Goal status: INITIAL   PLAN: PT FREQUENCY: 1-2x/week   PT DURATION: 8 weeks (Ending 05/15/2022)   PLANNED INTERVENTIONS: Therapeutic exercises, Aquatic therapy, Therapeutic activity, Neuro Muscular re-education, Gait training, Patient/Family education, Joint mobilization, Dry Needling, Electrical stimulation, Spinal mobilization and/or manipulation, Moist heat, Taping, Vasopneumatic device, Ionotophoresis 4mg /ml Dexamethasone, and Manual therapy   PLAN FOR NEXT SESSION: progressive R shoulder/elbow ROM and strengthening     Howard Patton MS, PT 04/09/22 9:24 AM

## 2022-04-09 ENCOUNTER — Ambulatory Visit: Payer: Commercial Managed Care - HMO | Attending: Orthopaedic Surgery

## 2022-04-09 DIAGNOSIS — M6281 Muscle weakness (generalized): Secondary | ICD-10-CM | POA: Insufficient documentation

## 2022-04-09 DIAGNOSIS — R600 Localized edema: Secondary | ICD-10-CM | POA: Diagnosis present

## 2022-04-09 DIAGNOSIS — G8929 Other chronic pain: Secondary | ICD-10-CM | POA: Diagnosis present

## 2022-04-09 DIAGNOSIS — M25511 Pain in right shoulder: Secondary | ICD-10-CM | POA: Diagnosis not present

## 2022-04-09 DIAGNOSIS — M25611 Stiffness of right shoulder, not elsewhere classified: Secondary | ICD-10-CM | POA: Insufficient documentation

## 2022-04-13 ENCOUNTER — Ambulatory Visit: Payer: Commercial Managed Care - HMO | Admitting: Physical Therapy

## 2022-04-20 ENCOUNTER — Ambulatory Visit: Payer: Commercial Managed Care - HMO | Admitting: Physical Therapy

## 2022-04-20 ENCOUNTER — Encounter: Payer: Self-pay | Admitting: Physical Therapy

## 2022-04-20 DIAGNOSIS — M6281 Muscle weakness (generalized): Secondary | ICD-10-CM

## 2022-04-20 DIAGNOSIS — R600 Localized edema: Secondary | ICD-10-CM

## 2022-04-20 DIAGNOSIS — M25511 Pain in right shoulder: Secondary | ICD-10-CM | POA: Diagnosis not present

## 2022-04-20 DIAGNOSIS — M25611 Stiffness of right shoulder, not elsewhere classified: Secondary | ICD-10-CM

## 2022-04-20 DIAGNOSIS — G8929 Other chronic pain: Secondary | ICD-10-CM

## 2022-04-20 NOTE — Therapy (Signed)
OUTPATIENT PHYSICAL THERAPY TREATMENT NOTE   Patient Name: Darin Hart MRN: [id number] DOB:07-11-1970, 51 y.o., male Today's Date: 04/20/2022  PCP: Georgina Quint, MD  REFERRING PROVIDER: Tarry Kos, MD   END OF SESSION:   PT End of Session - 04/20/22 1700     Visit Number 3    Number of Visits --   1-2x/week   Date for PT Re-Evaluation 05/15/22    Authorization Type [employer]    Progress Note Due on Visit 10    PT Start Time 1700    PT Stop Time 1741    PT Time Calculation (min) 41 min    Activity Tolerance Patient tolerated treatment well    Behavior During Therapy WFL for tasks assessed/performed             Past Medical History:  Diagnosis Date   Arthritis    knees   Asthma    Biceps rupture, proximal    right   Heart murmur    as child   Past Surgical History:  Procedure Laterality Date   BICEPT TENODESIS Right 09/30/2021   Procedure: RIGHT BICEPS TENODESIS;  Surgeon: Tarry Kos, MD;  Location: [facility];  Service: Orthopedics;  Laterality: Right;   HERNIA REPAIR     ORIF FINGER / THUMB FRACTURE Right    RHINOPLASTY     ROTATOR CUFF REPAIR Right    Spontaneous pneumothorax Left    20 yrs ago   Patient Active Problem List   Diagnosis Date Noted   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   Biceps rupture, proximal 12/08/2021   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 No, Number of falls: 0   MOI/History of condition:   Onset date: 09/30/21   SUBJECTIVE STATEMENT Pt reports that his shoulder is about the same.  He is able to workout at the gym prior to coming to therapy.   Pain:  Are you having pain? Yes Pain location: posterior shoulder radiating to anterior-lateral raises NPRS scale:  current 0/10  average 5-7/10  Aggravating factors: sleeping, sweeping, work Relieving factors: rest Pain description: burning and aching Stage: Chronic Stability: getting better 24 hour pattern: worse at night and with activity    Occupation: Scientist, clinical (histocompatibility and immunogenetics): NA   Hand Dominance: R   Patient Goals/Specific Activities: Get back to gym, reduce pain     OBJECTIVE: (objective measures completed at initial evaluation unless otherwise dated)      GENERAL OBSERVATION:          Slight rounded shoulder                               SENSATION:          Light touch: Appears intact  PALPATION: TTP infraspinatus, mild edema R shoulder    UPPER EXTREMITY AROM:   ROM Right 03/20/2022 Left 03/20/2022  Shoulder flexion WNL 95* WNL  Shoulder abduction WFL 90* WNL  Shoulder internal rotation      Shoulder external rotation      Functional IR L5 T10  Functional ER C7 T3  Shoulder extension      Elbow extension N N  Elbow flexion        (Blank rows = not tested, N = WNL, * = concordant pain with testing)   UPPER EXTREMITY MMT:   MMT Right 03/20/2022 Left 03/20/2022  Shoulder flexion 7* 15  Shoulder abduction (C5)      Shoulder ER 16 22  Shoulder IR      Middle trapezius 6.2 8.2  Lower trapezius 4.2* 5.8  Shoulder extension      Grip strength 34 44  Cervical flexion (C1,C2)      Cervical S/B (C3)      Shoulder shrug (C4)      Elbow flexion (C6)      Elbow ext (C7)      Thumb ext (C8)      Finger abd (T1)      Grossly        (Blank rows = not  tested, score listed in lbs of force.  N = WNL, D = diminished, C = clear for gross weakness with myotome testing, * = concordant pain with testing)             MUSCLE LENGTH:                      Pec Major: L (+), R (+*) for restriction   PATIENT SURVEYS:  FOTO 54 -> 71              TODAY'S TREATMENT:  [facility] Adult PT Treatment:                                                DATE: 04/20/22 Therapeutic Exercise: UBE 2.5'/2.5' fwd and backward for warm up while taking subjective - L 6 I, W->Y, 2x15 ea Shoulder shoulder scaption RTB 3x15 Vertical and lateral wall walks - YTB - 2x5 ea  Manual: GH jt AP grade 4 mobs for posterior capsule       HOME EXERCISE PROGRAM: Access Code: [email or web address] URL: [email or web address] Date: 04/20/2022 Prepared by: Alphonzo Severance  Exercises - Standing Shoulder Posterior Capsule Stretch  - 2 x daily - 7 x weekly - 3 sets - 1 reps - 45 hold - Doorway Pec Stretch at 60 Elevation  - 2 x daily - 7 x weekly - 3 sets - 1 reps - 45'' hold - Scaption with Resistance  - 1 x daily - 7 x weekly - 3 sets - 20 reps - Prone W Scapular Retraction  - 1 x daily - 7 x weekly - 3 sets - 10 reps - Prone Scapular Retraction Y  - 1 x daily - 7 x weekly - 3 sets - 10 reps - Standing Shoulder External Rotation with Resistance at 45 Degrees of Abduction  - 1 x daily - 7 x weekly - 3 sets - 10 reps   ASTERISK SIGNS     Asterisk Signs Eval (03/20/2022) 11/28           R  shoulder flexion Pain at 95 degrees  140          R shoulder MMT 7 lbs            ER MMT 16 lbs            Max pain 8/10            Poor mobility and pec length                  ASSESSMENT:   CLINICAL IMPRESSION: Progressing as expected with lower baseline pain and improved ROM to 140 before increase in pain.  HEP updated.   OBJECTIVE IMPAIRMENTS: Pain, R UE/shoulder strength, R shoulder ROM   ACTIVITY LIMITATIONS: lifting, reaching, working out, yardowrk   PERSONAL FACTORS: See  medical history and pertinent history     REHAB POTENTIAL: Fair chronic condition   CLINICAL DECISION MAKING: Stable/uncomplicated   EVALUATION COMPLEXITY: Low     GOALS:     SHORT TERM GOALS: Target date: 04/17/2022   Randel will be >75% HEP compliant to improve carryover between sessions and facilitate independent management of condition   Evaluation (03/20/2022): ongoing Goal status: INITIAL     LONG TERM GOALS: Target date: 05/15/2022   Senai will improve FOTO score to 71 as a proxy for functional improvement   Evaluation/Baseline (03/20/2022): 54 Goal status: INITIAL     2.  Xian will improve the following MMTs to within 15% of non-affected shoulder   Evaluation/Baseline (03/20/2022):    UPPER EXTREMITY MMT:   MMT Right 03/20/2022 Left 03/20/2022  Shoulder flexion 7* 15  Shoulder abduction (C5)      Shoulder ER 16 22  Shoulder IR      Middle trapezius 6.2 8.2  Lower trapezius 4.2* 5.8  Shoulder extension      Grip strength 34 44  Cervical flexion (C1,C2)      Cervical S/B (C3)      Shoulder shrug (C4)      Elbow flexion (C6)      Elbow ext (C7)      Thumb ext (C8)      Finger abd (T1)      Grossly        (Blank rows = not tested, score listed in lbs of force.  N = WNL, D = diminished, C = clear for gross weakness with myotome testing, * = concordant pain with testing)   Goal status: INITIAL     3.  Kentaro will self report >/= 50% decrease in pain from evaluation    Evaluation/Baseline (03/20/2022): 8/10 max pain Goal status: INITIAL     4.  Terreon will be able to return to gym, not limited by pain   Evaluation/Baseline (03/20/2022): limited Goal status: INITIAL     5.  Kanen will demonstrate >130 degrees of pain free active ROM in flexion to allow completion of activities involving reaching OH, not limited by pain   Evaluation/Baseline (03/20/2022): 95 degrees with pain Goal status: INITIAL   PLAN: PT  FREQUENCY: 1-2x/week   PT DURATION: 8 weeks (Ending 05/15/2022)   PLANNED INTERVENTIONS: Therapeutic exercises, Aquatic therapy, Therapeutic activity, Neuro Muscular re-education, Gait training, Patient/Family education, Joint mobilization, Dry Needling, Electrical stimulation, Spinal mobilization and/or manipulation, Moist heat, Taping, Vasopneumatic device, Ionotophoresis 4mg /ml Dexamethasone, and Manual therapy   PLAN FOR NEXT SESSION: progressive R shoulder/elbow ROM and strengthening     PT 04/20/22 5:43 PM

## 2022-04-27 ENCOUNTER — Ambulatory Visit: Payer: Commercial Managed Care - HMO | Attending: Orthopaedic Surgery

## 2022-04-27 NOTE — Therapy (Incomplete)
OUTPATIENT PHYSICAL THERAPY TREATMENT NOTE   Patient Name: Darin Hart MRN: [id number] DOB:Jan 20, 1971, 51 y.o., male Today's Date: 04/27/2022  PCP: Georgina Quint, MD  REFERRING PROVIDER: Tarry Kos, MD   END OF SESSION:     Past Medical History:  Diagnosis Date   Arthritis    knees   Asthma    Biceps rupture, proximal    right   Heart murmur    as child   Past Surgical History:  Procedure Laterality Date   BICEPT TENODESIS Right 09/30/2021   Procedure: RIGHT BICEPS TENODESIS;  Surgeon: Tarry Kos, MD;  Location: [facility];  Service: Orthopedics;  Laterality: Right;   HERNIA REPAIR     ORIF FINGER / THUMB FRACTURE Right    RHINOPLASTY     ROTATOR CUFF REPAIR Right    Spontaneous pneumothorax Left    20 yrs ago   Patient Active Problem List   Diagnosis Date Noted   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   Biceps rupture, proximal 12/08/2021   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  Rationale for Evaluation and Treatment Rehabilitation  SUBJECTIVE:   PERTINENT PAST HISTORY:  Biceps tenodesis 5/23        PRECAUTIONS: None   WEIGHT BEARING RESTRICTIONS No   FALLS:  Has patient fallen in last 6 months? No, Number of falls: 0   MOI/History of condition:   Onset date: 09/30/21   SUBJECTIVE STATEMENT *** Pt reports that his shoulder is about the same.  He is able to workout at the gym prior to coming to therapy.   Pain:  Are you having pain? Yes Pain location: posterior shoulder radiating to anterior-lateral raises NPRS scale:  current ***/10  average 5-7/10  Aggravating factors: sleeping, sweeping, work Relieving  factors: rest Pain description: burning and aching Stage: Chronic Stability: getting better 24 hour pattern: worse at night and with activity    Occupation: Scientist, clinical (histocompatibility and immunogenetics): NA   Hand Dominance: R   Patient Goals/Specific Activities: Get back to gym, reduce pain     OBJECTIVE: (objective measures completed at initial evaluation unless otherwise dated)      GENERAL OBSERVATION:          Slight rounded shoulder                               SENSATION:          Light touch: Appears intact           PALPATION: TTP infraspinatus, mild edema R shoulder    UPPER EXTREMITY AROM:   ROM Right 03/20/2022 Left 03/20/2022  Shoulder flexion WNL 95* WNL  Shoulder abduction WFL 90* WNL  Shoulder internal rotation      Shoulder external rotation      Functional IR L5 T10  Functional ER C7 T3  Shoulder extension      Elbow extension N N  Elbow flexion        (Blank rows = not tested, N = WNL, * = concordant pain with testing)   UPPER EXTREMITY MMT:   MMT Right 03/20/2022 Left 03/20/2022  Shoulder flexion 7* 15  Shoulder abduction (C5)      Shoulder ER 16 22  Shoulder IR      Middle trapezius 6.2 8.2  Lower trapezius 4.2* 5.8  Shoulder extension      Grip strength 34 44  Cervical flexion (C1,C2)      Cervical S/B (C3)      Shoulder shrug (C4)      Elbow flexion (C6)      Elbow ext (C7)      Thumb ext (C8)      Finger abd (T1)      Grossly        (Blank rows = not tested, score listed in lbs of force.  N = WNL, D = diminished, C = clear for gross weakness with myotome testing, * = concordant pain with testing)             MUSCLE LENGTH:                      Pec Major: L (+), R (+*) for restriction   PATIENT SURVEYS:  FOTO 54 -> 71              TODAY'S TREATMENT:  [facility] Adult PT Treatment:                                                DATE: 04/27/2022 Therapeutic Exercise: UBE level 1 3/3 fwd/bwd Prone ITWY 2x15 each Shoulder scaption back  against wall 1# Horizontal abduction back against wall RTB Diagonals back against wall RTB Rows GTB Shoulder extension GTB Sidelying R ER 3# Sidelying R abduction 3# Manual Therapy: GH jt AP grade 4 mobs for posterior capsule   [facility] Adult PT Treatment:                                                DATE: 04/20/22 Therapeutic Exercise: UBE 2.5'/2.5' fwd and backward for warm up while taking subjective - L 6 I, W->Y, 2x15 ea Shoulder shoulder scaption RTB 3x15 Vertical and lateral wall walks - YTB - 2x5 ea  Manual: GH jt AP grade 4 mobs for posterior capsule       HOME EXERCISE PROGRAM: Access Code: [email or web address] URL: [email or web address] Date: 04/20/2022 Prepared by: Alphonzo Severance  Exercises - Standing Shoulder Posterior Capsule Stretch  - 2 x daily - 7 x weekly - 3 sets - 1 reps - 45 hold - Doorway Pec Stretch at 60 Elevation  - 2 x daily - 7 x weekly - 3 sets - 1 reps - 45'' hold - Scaption with Resistance  - 1 x daily - 7 x weekly - 3 sets - 20 reps - Prone W Scapular Retraction  - 1 x daily - 7 x weekly - 3 sets - 10 reps - Prone Scapular Retraction Y  - 1 x daily - 7 x weekly - 3 sets - 10 reps - Standing Shoulder External Rotation with Resistance at 45 Degrees of Abduction  - 1 x daily - 7 x weekly - 3 sets - 10 reps   ASTERISK SIGNS     Asterisk Signs Eval (03/20/2022) 11/28           R  shoulder flexion Pain at 95 degrees  140          R shoulder MMT 7 lbs            ER MMT 16 lbs            Max pain 8/10            Poor mobility and pec length                  ASSESSMENT:   CLINICAL IMPRESSION: ***  Progressing as expected with lower baseline pain and improved ROM to 140 before increase in pain.  HEP updated.   OBJECTIVE IMPAIRMENTS: Pain, R UE/shoulder strength, R shoulder ROM   ACTIVITY LIMITATIONS: lifting, reaching, working out, yardowrk   PERSONAL FACTORS: See medical history and pertinent history     REHAB POTENTIAL: Fair chronic  condition   CLINICAL DECISION MAKING: Stable/uncomplicated   EVALUATION COMPLEXITY: Low     GOALS:     SHORT TERM GOALS: Target date: 04/17/2022   Telly will be >75% HEP compliant to improve carryover between sessions and facilitate independent management of condition   Evaluation (03/20/2022): ongoing Goal status: INITIAL     LONG TERM GOALS: Target date: 05/15/2022   Dewel will improve FOTO score to 71 as a proxy for functional improvement   Evaluation/Baseline (03/20/2022): 54 Goal status: INITIAL     2.  Vu will improve the following MMTs to within 15% of non-affected shoulder   Evaluation/Baseline (03/20/2022):    UPPER EXTREMITY MMT:   MMT Right 03/20/2022 Left 03/20/2022  Shoulder flexion 7* 15  Shoulder abduction (C5)      Shoulder ER 16 22  Shoulder IR      Middle trapezius 6.2 8.2  Lower trapezius 4.2* 5.8  Shoulder extension      Grip strength 34 44  Cervical flexion (C1,C2)      Cervical S/B (C3)      Shoulder shrug (C4)      Elbow flexion (C6)      Elbow ext (C7)      Thumb ext (C8)      Finger abd (T1)      Grossly        (Blank rows = not tested, score listed in lbs of force.  N = WNL, D = diminished, C = clear for gross weakness with myotome testing, * = concordant pain with testing)   Goal status: INITIAL     3.  Franky will self report >/= 50% decrease in pain from evaluation    Evaluation/Baseline (03/20/2022): 8/10 max pain Goal status: INITIAL     4.  Quintez will be able to return to gym, not limited by pain   Evaluation/Baseline (03/20/2022): limited Goal status: INITIAL     5.  Santiago will demonstrate >130 degrees of pain free active ROM in flexion to allow completion of activities involving reaching OH, not limited by pain   Evaluation/Baseline (03/20/2022): 95 degrees with pain Goal status: INITIAL   PLAN: PT FREQUENCY: 1-2x/week   PT DURATION: 8 weeks (Ending 05/15/2022)    PLANNED INTERVENTIONS: Therapeutic exercises, Aquatic therapy, Therapeutic activity, Neuro Muscular re-education, Gait training, Patient/Family education, Joint mobilization, Dry Needling, Electrical stimulation, Spinal mobilization and/or manipulation, Moist heat, Taping, Vasopneumatic device, Ionotophoresis 4mg /ml Dexamethasone, and Manual therapy   PLAN FOR NEXT SESSION: progressive R shoulder/elbow ROM and strengthening     PTA 04/27/22 10:12 AM

## 2022-06-07 ENCOUNTER — Encounter: Payer: Self-pay | Admitting: Internal Medicine

## 2022-06-07 NOTE — Progress Notes (Deleted)
    Subjective:    Patient ID: Darin Hart, male    DOB: Mar 26, 1971, 52 y.o.   MRN: [id number]      HPI Darin Hart is here for No chief complaint on file.    Back pain -     Medications and allergies reviewed with patient and updated if appropriate.  Current Outpatient Medications on File Prior to Visit  Medication Sig Dispense Refill   albuterol (VENTOLIN HFA) 108 (90 Base) MCG/ACT inhaler Inhale 2 puffs into the lungs every 6 (six) hours as needed for wheezing or shortness of breath. 18 g 3   diclofenac (VOLTAREN) 75 MG EC tablet Take 1 tablet (75 mg total) by mouth 2 (two) times daily. 30 tablet 2   HYDROcodone-acetaminophen (NORCO) 5-325 MG tablet Take 1 tablet by mouth 3 (three) times daily as needed. 20 tablet 0   Multiple Vitamin (MULTIVITAMIN WITH MINERALS) TABS tablet Take 1 tablet by mouth daily.     ondansetron (ZOFRAN) 4 MG tablet Take 1-2 tablets (4-8 mg total) by mouth every 8 (eight) hours as needed for nausea or vomiting. 20 tablet 0   No current facility-administered medications on file prior to visit.    Review of Systems     Objective:  There were no vitals filed for this visit. BP Readings from Last 3 Encounters:  02/15/22 133/73  01/13/22 110/70  12/07/21 122/84   Wt Readings from Last 3 Encounters:  02/15/22 180 lb (81.6 kg)  01/13/22 175 lb 2 oz (79.4 kg)  12/07/21 177 lb (80.3 kg)   There is no height or weight on file to calculate BMI.    Physical Exam         Assessment & Plan:    See Problem List for Assessment and Plan of chronic medical problems.

## 2022-06-08 ENCOUNTER — Ambulatory Visit: Payer: Commercial Managed Care - HMO | Admitting: Internal Medicine

## 2022-06-16 ENCOUNTER — Ambulatory Visit: Payer: BLUE CROSS/BLUE SHIELD | Admitting: Emergency Medicine

## 2022-06-16 ENCOUNTER — Encounter: Payer: Self-pay | Admitting: Emergency Medicine

## 2022-06-16 VITALS — BP 120/82 | HR 70 | Temp 97.7°F | Ht 72.0 in | Wt 182.4 lb

## 2022-06-16 DIAGNOSIS — Z8719 Personal history of other diseases of the digestive system: Secondary | ICD-10-CM

## 2022-06-16 DIAGNOSIS — Z23 Encounter for immunization: Secondary | ICD-10-CM

## 2022-06-16 DIAGNOSIS — Z9889 Other specified postprocedural states: Secondary | ICD-10-CM | POA: Diagnosis not present

## 2022-06-16 DIAGNOSIS — R1032 Left lower quadrant pain: Secondary | ICD-10-CM | POA: Diagnosis not present

## 2022-06-16 NOTE — Assessment & Plan Note (Signed)
Most likely secondary to inflammatory/scar tissue from left inguinal hernia repair last year.  No complications.  No hernias on physical exam.  Pain management discussed. Recommend to follow-up with general surgeon as scheduled this week.

## 2022-06-16 NOTE — Progress Notes (Signed)
Darin Hart 52 y.o.   Chief Complaint  Patient presents with   Follow-up    Patient states he had inguinal hernia repair and he is having some issues, patient is wondering if the hernia came back.   Wants to get a handicap placard     HISTORY OF PRESENT ILLNESS: This is a 52 y.o. male A1A presenting today with concerns of left inguinal hernia which was repaired about a year ago.  Concerned about recurrence.  Has occasional intermittent pain to the left inguinal area.  Planning on following up with general surgeon later this week.  No associated symptoms. No other complaints or medical concerns today.  HPI   Prior to Admission medications   Medication Sig Start Date End Date Taking? Authorizing Provider  albuterol (VENTOLIN HFA) 108 (90 Base) MCG/ACT inhaler Inhale 2 puffs into the lungs every 6 (six) hours as needed for wheezing or shortness of breath. 01/13/22  Yes Lareta Bruneau, Ines Bloomer, MD  diclofenac (VOLTAREN) 75 MG EC tablet Take 1 tablet (75 mg total) by mouth 2 (two) times daily. 11/10/21  Yes Aundra Dubin, PA-C  HYDROcodone-acetaminophen (NORCO) 5-325 MG tablet Take 1 tablet by mouth 3 (three) times daily as needed. 10/27/21  Yes Aundra Dubin, PA-C  Multiple Vitamin (MULTIVITAMIN WITH MINERALS) TABS tablet Take 1 tablet by mouth daily.   Yes [provider]  ondansetron (ZOFRAN) 4 MG tablet Take 1-2 tablets (4-8 mg total) by mouth every 8 (eight) hours as needed for nausea or vomiting. 09/30/21  Yes Leandrew Koyanagi, MD    Allergies  Allergen Reactions   Shellfish Allergy Anaphylaxis   Other Other (See Comments)    Migraines, itchy tongue, burning mouth All Tree Nuts   Penicillins     Pt was told he could not take this, his reaction is unknown Has patient had a PCN reaction causing immediate rash, facial/tongue/throat swelling, SOB or lightheadedness with hypotension: Unknown Has patient had a PCN reaction causing severe rash involving mucus membranes or  skin necrosis: Unknown Has patient had a PCN reaction that required hospitalization: NO Has patient had a PCN reaction occurring within the last 10 years: NO If all of the above answers are "NO", then may proceed with Cephalospo    Patient Active Problem List   Diagnosis Date Noted   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   Biceps rupture, proximal 12/08/2021   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   Low back pain 01/23/2021   Chronic musculoskeletal pain 10/02/2020   History of asthma 10/02/2020   History of multiple allergies 10/02/2020   Low testosterone 02/07/2018    Past Medical History:  Diagnosis Date   Arthritis    knees   Asthma    Biceps rupture, proximal    right   Heart murmur    as child    Past Surgical History:  Procedure Laterality Date   BICEPT TENODESIS Right 09/30/2021   Procedure: RIGHT BICEPS TENODESIS;  Surgeon: Leandrew Koyanagi, MD;  Location: [facility];  Service: Orthopedics;  Laterality: Right;   HERNIA REPAIR     ORIF FINGER / THUMB FRACTURE Right    RHINOPLASTY     ROTATOR CUFF REPAIR Right    Spontaneous pneumothorax Left    20 yrs ago    Social History   Socioeconomic History   Marital status: Married    Spouse name: Not on file   Number of children: Not on file  Years of education: Not on file   Highest education level: Not on file  Occupational History   Not on file  Tobacco Use   Smoking status: Never   Smokeless tobacco: Never  Vaping Use   Vaping Use: Never used  Substance and Sexual Activity   Alcohol use: Yes    Comment: OCCASIONALLY   Drug use: Never   Sexual activity: Yes    Partners: Female  Other Topics Concern   Not on file  Social History Narrative   Not on file   Social Determinants of Health   Financial Resource Strain: Low Risk  (12/05/2018)   Overall Financial Resource Strain (CARDIA)    Difficulty of Paying Living Expenses: Not hard at all  Food Insecurity: No  Food Insecurity (12/05/2018)   Hunger Vital Sign    Worried About Running Out of Food in the Last Year: Never true    Ran Out of Food in the Last Year: Never true  Transportation Needs: No Transportation Needs (12/05/2018)   PRAPARE - Administrator, Civil Service (Medical): No    Lack of Transportation (Non-Medical): No  Physical Activity: Not on file  Stress: Stress Concern Present (12/05/2018)   [employer] of Occupational Health - Occupational Stress Questionnaire    Feeling of Stress : To some extent  Social Connections: Unknown (12/05/2018)   Social Connection and Isolation Panel [NHANES]    Frequency of Communication with Friends and Family: More than three times a week    Frequency of Social Gatherings with Friends and Family: More than three times a week    Attends Religious Services: Not on file    Active Member of Clubs or Organizations: Not on file    Attends Banker Meetings: Not on file    Marital Status: Married  Intimate Partner Violence: Not At Risk (12/05/2018)   Humiliation, Afraid, Rape, and Kick questionnaire    Fear of Current or Ex-Partner: No    Emotionally Abused: No    Physically Abused: No    Sexually Abused: No    Family History  Problem Relation Age of Onset   Hypertension Mother    Other Neg Hx        hypogonadism     Review of Systems  Constitutional: Negative.  Negative for chills and fever.  HENT: Negative.  Negative for congestion and sore throat.   Respiratory: Negative.  Negative for cough and shortness of breath.   Cardiovascular: Negative.  Negative for chest pain and palpitations.  Gastrointestinal:  Negative for abdominal pain, diarrhea, nausea and vomiting.  Genitourinary: Negative.   Skin: Negative.  Negative for rash.  Neurological: Negative.  Negative for dizziness and headaches.  All other systems reviewed and are negative.   Today's Vitals   06/16/22 0843  BP: 120/82  Pulse: 70  Temp: 97.7 F  (36.5 C)  TempSrc: Oral  SpO2: 97%  Weight: 182 lb 6 oz (82.7 kg)  Height: 6' (1.829 m)   Body mass index is 24.73 kg/m.  Physical Exam Vitals reviewed.  Constitutional:      Appearance: Normal appearance.  HENT:     Head: Normocephalic.  Eyes:     Extraocular Movements: Extraocular movements intact.  Cardiovascular:     Rate and Rhythm: Normal rate.  Pulmonary:     Effort: Pulmonary effort is normal.  Genitourinary:    Comments: Left inguinal area: Visible surgical scar.  No palpable hernias.  No abnormal findings. Skin:  General: Skin is warm and dry.  Neurological:     Mental Status: He is alert and oriented to person, place, and time.  Psychiatric:        Mood and Affect: Mood normal.        Behavior: Behavior normal.      ASSESSMENT & PLAN: A total of 34 minutes was spent with the patient and counseling/coordination of care regarding preparing for this visit, review of most recent office visit notes, review of chronic medical conditions and all medications, differential diagnosis of left inguinal pain and need to follow-up with general surgeon later this week, pain management, prognosis, documentation, need for follow-up.  Problem List Items Addressed This Visit       Other   Left inguinal pain - Primary    Most likely secondary to inflammatory/scar tissue from left inguinal hernia repair last year.  No complications.  No hernias on physical exam.  Pain management discussed. Recommend to follow-up with general surgeon as scheduled this week.      History of inguinal hernia repair   Patient Instructions  Health Maintenance, Male Adopting a healthy lifestyle and getting preventive care are important in promoting health and wellness. Ask your health care provider about: The right schedule for you to have regular tests and exams. Things you can do on your own to prevent diseases and keep yourself healthy. What should I know about diet, weight, and  exercise? Eat a healthy diet  Eat a diet that includes plenty of vegetables, fruits, low-fat dairy products, and lean protein. Do not eat a lot of foods that are high in solid fats, added sugars, or sodium. Maintain a healthy weight Body mass index (BMI) is a measurement that can be used to identify possible weight problems. It estimates body fat based on height and weight. Your health care provider can help determine your BMI and help you achieve or maintain a healthy weight. Get regular exercise Get regular exercise. This is one of the most important things you can do for your health. Most adults should: Exercise for at least 150 minutes each week. The exercise should increase your heart rate and make you sweat (moderate-intensity exercise). Do strengthening exercises at least twice a week. This is in addition to the moderate-intensity exercise. Spend less time sitting. Even light physical activity can be beneficial. Watch cholesterol and blood lipids Have your blood tested for lipids and cholesterol at 52 years of age, then have this test every 5 years. You may need to have your cholesterol levels checked more often if: Your lipid or cholesterol levels are high. You are older than 52 years of age. You are at high risk for heart disease. What should I know about cancer screening? Many types of cancers can be detected early and may often be prevented. Depending on your health history and family history, you may need to have cancer screening at various ages. This may include screening for: Colorectal cancer. Prostate cancer. Skin cancer. Lung cancer. What should I know about heart disease, diabetes, and high blood pressure? Blood pressure and heart disease High blood pressure causes heart disease and increases the risk of stroke. This is more likely to develop in people who have high blood pressure readings or are overweight. Talk with your health care provider about your target blood  pressure readings. Have your blood pressure checked: Every 3-5 years if you are 80-34 years of age. Every year if you are 16 years old or older. If you are  between the ages of 33 and 24 and are a current or former smoker, ask your health care provider if you should have a one-time screening for abdominal aortic aneurysm (AAA). Diabetes Have regular diabetes screenings. This checks your fasting blood sugar level. Have the screening done: Once every three years after age 80 if you are at a normal weight and have a low risk for diabetes. More often and at a younger age if you are overweight or have a high risk for diabetes. What should I know about preventing infection? Hepatitis B If you have a higher risk for hepatitis B, you should be screened for this virus. Talk with your health care provider to find out if you are at risk for hepatitis B infection. Hepatitis C Blood testing is recommended for: Everyone born from [birth year] through 1965. Anyone with known risk factors for hepatitis C. Sexually transmitted infections (STIs) You should be screened each year for STIs, including gonorrhea and chlamydia, if: You are sexually active and are younger than 52 years of age. You are older than 52 years of age and your health care provider tells you that you are at risk for this type of infection. Your sexual activity has changed since you were last screened, and you are at increased risk for chlamydia or gonorrhea. Ask your health care provider if you are at risk. Ask your health care provider about whether you are at high risk for HIV. Your health care provider may recommend a prescription medicine to help prevent HIV infection. If you choose to take medicine to prevent HIV, you should first get tested for HIV. You should then be tested every 3 months for as long as you are taking the medicine. Follow these instructions at home: Alcohol use Do not drink alcohol if your health care provider tells you not to  drink. If you drink alcohol: Limit how much you have to 0-2 drinks a day. Know how much alcohol is in your drink. In the [place] one drink equals one 12 oz bottle of beer (355 mL), one 5 oz glass of wine (148 mL), or one 1 oz glass of hard liquor (44 mL). Lifestyle Do not use any products that contain nicotine or tobacco. These products include cigarettes, chewing tobacco, and vaping devices, such as e-cigarettes. If you need help quitting, ask your health care provider. Do not use street drugs. Do not share needles. Ask your health care provider for help if you need support or information about quitting drugs. General instructions Schedule regular health, dental, and eye exams. Stay current with your vaccines. Tell your health care provider if: You often feel depressed. You have ever been abused or do not feel safe at home. Summary Adopting a healthy lifestyle and getting preventive care are important in promoting health and wellness. Follow your health care provider's instructions about healthy diet, exercising, and getting tested or screened for diseases. Follow your health care provider's instructions on monitoring your cholesterol and blood pressure. This information is not intended to replace advice given to you by your health care provider. Make sure you discuss any questions you have with your health care provider. Document Revised: 09/29/2020 Document Reviewed: 09/29/2020 Elsevier Patient Education  [place], MD [facility]

## 2022-06-16 NOTE — Patient Instructions (Signed)
Health Maintenance, Male Adopting a healthy lifestyle and getting preventive care are important in promoting health and wellness. Ask your health care provider about: The right schedule for you to have regular tests and exams. Things you can do on your own to prevent diseases and keep yourself healthy. What should I know about diet, weight, and exercise? Eat a healthy diet  Eat a diet that includes plenty of vegetables, fruits, low-fat dairy products, and lean protein. Do not eat a lot of foods that are high in solid fats, added sugars, or sodium. Maintain a healthy weight Body mass index (BMI) is a measurement that can be used to identify possible weight problems. It estimates body fat based on height and weight. Your health care provider can help determine your BMI and help you achieve or maintain a healthy weight. Get regular exercise Get regular exercise. This is one of the most important things you can do for your health. Most adults should: Exercise for at least 150 minutes each week. The exercise should increase your heart rate and make you sweat (moderate-intensity exercise). Do strengthening exercises at least twice a week. This is in addition to the moderate-intensity exercise. Spend less time sitting. Even light physical activity can be beneficial. Watch cholesterol and blood lipids Have your blood tested for lipids and cholesterol at 52 years of age, then have this test every 5 years. You may need to have your cholesterol levels checked more often if: Your lipid or cholesterol levels are high. You are older than 52 years of age. You are at high risk for heart disease. What should I know about cancer screening? Many types of cancers can be detected early and may often be prevented. Depending on your health history and family history, you may need to have cancer screening at various ages. This may include screening for: Colorectal cancer. Prostate cancer. Skin cancer. Lung  cancer. What should I know about heart disease, diabetes, and high blood pressure? Blood pressure and heart disease High blood pressure causes heart disease and increases the risk of stroke. This is more likely to develop in people who have high blood pressure readings or are overweight. Talk with your health care provider about your target blood pressure readings. Have your blood pressure checked: Every 3-5 years if you are 18-39 years of age. Every year if you are 40 years old or older. If you are between the ages of 65 and 75 and are a current or former smoker, ask your health care provider if you should have a one-time screening for abdominal aortic aneurysm (AAA). Diabetes Have regular diabetes screenings. This checks your fasting blood sugar level. Have the screening done: Once every three years after age 45 if you are at a normal weight and have a low risk for diabetes. More often and at a younger age if you are overweight or have a high risk for diabetes. What should I know about preventing infection? Hepatitis B If you have a higher risk for hepatitis B, you should be screened for this virus. Talk with your health care provider to find out if you are at risk for hepatitis B infection. Hepatitis C Blood testing is recommended for: Everyone born from 1945 through 1965. Anyone with known risk factors for hepatitis C. Sexually transmitted infections (STIs) You should be screened each year for STIs, including gonorrhea and chlamydia, if: You are sexually active and are younger than 52 years of age. You are older than 52 years of age and your   health care provider tells you that you are at risk for this type of infection. Your sexual activity has changed since you were last screened, and you are at increased risk for chlamydia or gonorrhea. Ask your health care provider if you are at risk. Ask your health care provider about whether you are at high risk for HIV. Your health care provider  may recommend a prescription medicine to help prevent HIV infection. If you choose to take medicine to prevent HIV, you should first get tested for HIV. You should then be tested every 3 months for as long as you are taking the medicine. Follow these instructions at home: Alcohol use Do not drink alcohol if your health care provider tells you not to drink. If you drink alcohol: Limit how much you have to 0-2 drinks a day. Know how much alcohol is in your drink. In the [place] one drink equals one 12 oz bottle of beer (355 mL), one 5 oz glass of wine (148 mL), or one 1 oz glass of hard liquor (44 mL). Lifestyle Do not use any products that contain nicotine or tobacco. These products include cigarettes, chewing tobacco, and vaping devices, such as e-cigarettes. If you need help quitting, ask your health care provider. Do not use street drugs. Do not share needles. Ask your health care provider for help if you need support or information about quitting drugs. General instructions Schedule regular health, dental, and eye exams. Stay current with your vaccines. Tell your health care provider if: You often feel depressed. You have ever been abused or do not feel safe at home. Summary Adopting a healthy lifestyle and getting preventive care are important in promoting health and wellness. Follow your health care provider's instructions about healthy diet, exercising, and getting tested or screened for diseases. Follow your health care provider's instructions on monitoring your cholesterol and blood pressure. This information is not intended to replace advice given to you by your health care provider. Make sure you discuss any questions you have with your health care provider. Document Revised: 09/29/2020 Document Reviewed: 09/29/2020 Elsevier Patient Education  2023 [employer]  

## 2022-07-14 ENCOUNTER — Ambulatory Visit: Payer: Commercial Managed Care - HMO | Admitting: Emergency Medicine

## 2022-08-12 ENCOUNTER — Encounter: Payer: Self-pay | Admitting: Emergency Medicine

## 2022-08-12 ENCOUNTER — Ambulatory Visit (INDEPENDENT_AMBULATORY_CARE_PROVIDER_SITE_OTHER): Payer: Self-pay | Admitting: Emergency Medicine

## 2022-08-12 ENCOUNTER — Ambulatory Visit (INDEPENDENT_AMBULATORY_CARE_PROVIDER_SITE_OTHER): Payer: BLUE CROSS/BLUE SHIELD

## 2022-08-12 VITALS — BP 124/82 | HR 64 | Temp 98.2°F | Ht 72.0 in | Wt 182.1 lb

## 2022-08-12 DIAGNOSIS — S4991XA Unspecified injury of right shoulder and upper arm, initial encounter: Secondary | ICD-10-CM

## 2022-08-12 DIAGNOSIS — M25511 Pain in right shoulder: Secondary | ICD-10-CM
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
## 2022-08-12 NOTE — Patient Instructions (Signed)
Shoulder Pain Many things can cause shoulder pain, including: An injury. Moving the shoulder in the same way again and again (overuse). Joint pain (arthritis). Pain can come from: Swelling and irritation (inflammation) of any part of the shoulder. An injury to: The shoulder joint. Tissues that connect muscle to bone (tendons). Tissues that connect bones to each other (ligaments). Bones. Follow these instructions at home: Watch for changes in your symptoms. Let your doctor know about them. Follow these instructions to help with your pain. If you have a sling that can be taken off: Wear the sling as told by your doctor. Take it off only as told by your doctor. Check the skin around the sling every day. Tell your doctor if you see problems. Loosen the sling if your fingers: Tingle. Become numb. Become cold. Keep the sling clean. If the sling is not waterproof: Do not let it get wet. Take the sling off when you shower or bathe. Managing pain, stiffness, and swelling  If told, put ice on the painful area. Put ice in a plastic bag. Place a towel between your skin and the bag. Leave the ice on for 20 minutes, 2-3 times a day. Stop putting ice on if it does not help with the pain. If your skin turns bright red, take off the ice right away to prevent skin damage. The risk of damage is higher if you cannot feel pain, heat, or cold. Squeeze a soft ball or a foam pad as much as possible. This prevents swelling in the shoulder. It also helps to strengthen the arm. General instructions Take over-the-counter and prescription medicines only as told by your doctor. Keep all follow-up visits. This will help you avoid any type of permanent shoulder problems. Contact a doctor if: Your pain gets worse. Medicine does not help your pain. You have new pain in your arm, hand, or fingers. You loosen your sling and your arm, hand, or fingers: Tingle. Are numb. Are swollen. Get help right away  if: Your arm, hand, or fingers turn white or blue. This information is not intended to replace advice given to you by your health care provider. Make sure you discuss any questions you have with your health care provider. Document Revised: 12/11/2021 Document Reviewed: 12/11/2021 Elsevier Patient Education  2023 Elsevier Inc.  

## 2022-08-12 NOTE — Assessment & Plan Note (Signed)
X-ray negative for fracture Still having ongoing pain Needs orthopedic evaluation Patient already established with orthopedist.  Will make appointment to see them.

## 2022-08-12 NOTE — Assessment & Plan Note (Signed)
Pain management discussed May take Tylenol and diclofenac as needed Has Norco at home for moderate to severe pain as needed Needs to follow-up with orthopedist

## 2022-08-27 ENCOUNTER — Ambulatory Visit (INDEPENDENT_AMBULATORY_CARE_PROVIDER_SITE_OTHER): Payer: Self-pay | Admitting: Orthopaedic Surgery

## 2022-08-27 DIAGNOSIS — G8929 Other chronic pain: Secondary | ICD-10-CM

## 2022-08-27 DIAGNOSIS — M25511 Pain in right shoulder: Secondary | ICD-10-CM

## 2022-08-27 NOTE — Progress Notes (Signed)
Office Visit Note   Patient: Darin Hart           Date of Birth: February 23, 1971           MRN: [id number] Visit Date: 08/27/2022              Requested by: Georgina Quint, MD [address] [place],  [place] [address] PCP: Georgina Quint, MD   Assessment & Plan: Visit Diagnoses:  1. Chronic right shoulder pain     Plan: Impression is 11 months status post open right shoulder biceps tenodesis with subsequent fall.  At this point, would like to obtain an MRI of the right shoulder to assess for structural abnormalities.  Will follow-up with [place] once completed.  Call with concerns or questions.  Follow-Up Instructions: Return for f/u after mri.   Orders:  Orders Placed This Encounter  Procedures   MR SHOULDER RIGHT WO CONTRAST   No orders of the defined types were placed in this encounter.     Procedures: No procedures performed   Clinical Data: No additional findings.   Subjective: Chief Complaint  Patient presents with   Right Shoulder - Pain    HPI patient is a pleasant 52 year old gentleman who comes in today with continued right shoulder pain.  He underwent open biceps tenodesis 09/30/2021.  He started to improve several months following surgery but never was 100%.  He notes that he fell on his right shoulder back in February of this year.  X-rays were obtained which were negative for fracture.  He has had pain to the deltoid since.  He has subjective weakness as well.  Symptoms are worse with forward flexion and abduction.  He does not take anything for the pain.  Review of Systems as detailed in HPI.  All others reviewed and are negative.   Objective: Vital Signs: There were no vitals taken for this visit.  Physical Exam well-developed well-nourished gentleman in no acute distress.  Alert and oriented x 3.  Ortho Exam right shoulder exam reveals full active range of motion in all planes.  Does have positive empty can test.  Negative speeds,  negative O'Brien's and negative bearhug.  No focal weakness.  5 out of 5 strength throughout.  He is neurovascular intact distally.  Specialty Comments:  No specialty comments available.  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   Chronic musculoskeletal pain 10/02/2020   History of asthma 10/02/2020   History of multiple allergies 10/02/2020   Low testosterone 02/07/2018   Acute pain of right shoulder 01/30/2018   Past Medical History:  Diagnosis Date   Arthritis    knees   Asthma    Biceps rupture, proximal    right   Heart murmur    as child    Family History  Problem Relation Age of Onset   Hypertension Mother    Other Neg Hx        hypogonadism    Past Surgical History:  Procedure Laterality Date   BICEPT TENODESIS Right 09/30/2021   Procedure: RIGHT BICEPS TENODESIS;  Surgeon: Tarry Kos, MD;  Location: [facility];  Service: Orthopedics;  Laterality: Right;   HERNIA REPAIR     ORIF FINGER / THUMB FRACTURE Right    RHINOPLASTY  ROTATOR CUFF REPAIR Right    Spontaneous pneumothorax Left    20 yrs ago   Social History   Occupational History   Not on file  Tobacco Use   Smoking status: Never   Smokeless tobacco: Never  Vaping Use   Vaping Use: Never used  Substance and Sexual Activity   Alcohol use: Yes    Comment: OCCASIONALLY   Drug use: Never   Sexual activity: Yes    Partners: Female

## 2022-10-05 ENCOUNTER — Encounter: Payer: Self-pay | Admitting: Physician Assistant

## 2022-10-05 ENCOUNTER — Other Ambulatory Visit (INDEPENDENT_AMBULATORY_CARE_PROVIDER_SITE_OTHER): Payer: 59

## 2022-10-05 ENCOUNTER — Ambulatory Visit (INDEPENDENT_AMBULATORY_CARE_PROVIDER_SITE_OTHER): Payer: 59 | Admitting: Physician Assistant

## 2022-10-05 ENCOUNTER — Telehealth: Payer: Self-pay | Admitting: Physician Assistant

## 2022-10-05 ENCOUNTER — Telehealth: Payer: Self-pay | Admitting: Orthopaedic Surgery

## 2022-10-05 DIAGNOSIS — M545 Low back pain, unspecified: Secondary | ICD-10-CM

## 2022-10-05 MED ORDER — METHOCARBAMOL 750 MG PO TABS: 750.0000 mg | ORAL_TABLET | Freq: Three times a day (TID) | ORAL | 2 refills | Status: AC | PRN

## 2022-10-05 MED ORDER — HYDROCODONE-ACETAMINOPHEN 5-325 MG PO TABS: 1.0000 | ORAL_TABLET | Freq: Three times a day (TID) | ORAL | 0 refills | Status: DC | PRN

## 2022-10-05 MED ORDER — PREDNISONE 10 MG (21) PO TBPK: ORAL_TABLET | ORAL | 0 refills | Status: DC

## 2022-10-05 NOTE — Telephone Encounter (Signed)
Patient called advised [facility] can not fill his Rx. Patient asked if the Rx can be sent to the [facility] on [address].  The number to contact is [phone]

## 2022-10-05 NOTE — Progress Notes (Signed)
Office Visit Note   Patient: Darin Hart           Date of Birth: 09-14-1970           MRN: [id number] Visit Date: 10/05/2022              Requested by: Georgina Quint, MD [address] [place],  [place] [address] PCP: Georgina Quint, MD   Assessment & Plan: Visit Diagnoses:  1. Acute low back pain, unspecified back pain laterality, unspecified whether sciatica present     Plan: Impression is acute low back pain likely from lumbar strain.  At this point, would like to start the patient on a steroid pack and muscle relaxer.  Feel as though he is in too much pain to be referred to physical therapy.  If his symptoms persist, he will let us know.  Call with concerns or questions in the meantime.  Follow-Up Instructions: Return if symptoms worsen or fail to improve.   Orders:  Orders Placed This Encounter  Procedures   XR Lumbar Spine 2-3 Views   Meds ordered this encounter  Medications   predniSONE (STERAPRED UNI-PAK 21 TAB) 10 MG (21) TBPK tablet    Sig: Take as directed    Dispense:  21 tablet    Refill:  0   methocarbamol (ROBAXIN-750) 750 MG tablet    Sig: Take 1 tablet (750 mg total) by mouth 3 (three) times daily as needed for muscle spasms.    Dispense:  30 tablet    Refill:  2   HYDROcodone-acetaminophen (NORCO) 5-325 MG tablet    Sig: Take 1 tablet by mouth 3 (three) times daily as needed.    Dispense:  20 tablet    Refill:  0      Procedures: No procedures performed   Clinical Data: No additional findings.   Subjective: Chief Complaint  Patient presents with   Lower Back - Pain    HPI patient is a pleasant 52 year old gentleman who comes in today with acute low back pain.  He tells me that he was cleaning his bathtub yesterday and when he stood up he felt a pull to the low back.  He was doing okay today but then sneezed relatively hard and had a significant pain to the middle of his lower back.  Pain is aggravated when he goes  from seated to standing position as well as with certain movements of the back.  He does get relief lying down.  He notes slight weakness of the left leg.  He denies any paresthesias to either lower extremity.  No bowel or bladder change.  Review of Systems as detailed in HPI.  All others reviewed and are negative.   Objective: Vital Signs: There were no vitals taken for this visit.  Physical Exam well-developed well-nourished gentleman in no acute distress.  Alert and oriented x 3.  Ortho Exam lumbar spine exam reveals no spinous or paraspinous tenderness.  He does have increased pain with lumbar flexion, extension and rotation.  Positive straight leg raise on the left.  No focal weakness.  He is neurovascularly intact distally.  Specialty Comments:  No specialty comments available.  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   Chronic musculoskeletal pain 10/02/2020   History of asthma 10/02/2020   History of multiple allergies 10/02/2020   Low testosterone 02/07/2018   Acute pain of right shoulder 01/30/2018   Past Medical History:  Diagnosis Date   Arthritis    knees   Asthma    Biceps rupture, proximal    right   Heart murmur    as child    Family History  Problem Relation Age of Onset   Hypertension Mother    Other Neg Hx        hypogonadism    Past Surgical History:  Procedure Laterality Date   BICEPT TENODESIS Right 09/30/2021   Procedure: RIGHT BICEPS TENODESIS;  Surgeon: Tarry Kos, MD;  Location: [facility];  Service: Orthopedics;  Laterality: Right;   HERNIA REPAIR     ORIF FINGER / THUMB FRACTURE Right    RHINOPLASTY      ROTATOR CUFF REPAIR Right    Spontaneous pneumothorax Left    20 yrs ago   Social History   Occupational History   Not on file  Tobacco Use   Smoking status: Never   Smokeless tobacco: Never  Vaping Use   Vaping Use: Never used  Substance and Sexual Activity   Alcohol use: Yes    Comment: OCCASIONALLY   Drug use: Never   Sexual activity: Yes    Partners: Female

## 2022-10-05 NOTE — Telephone Encounter (Signed)
Patient came in today with [employer] was wondering if his referral was still good to schedule. Please advise

## 2022-10-06 ENCOUNTER — Telehealth: Payer: Self-pay | Admitting: Physician Assistant

## 2022-10-06 ENCOUNTER — Encounter: Payer: Self-pay | Admitting: Orthopaedic Surgery

## 2022-10-06 ENCOUNTER — Other Ambulatory Visit: Payer: Self-pay

## 2022-10-06 MED ORDER — IBUPROFEN 800 MG PO TABS: 800.0000 mg | ORAL_TABLET | Freq: Three times a day (TID) | ORAL | 0 refills | Status: DC | PRN

## 2022-10-06 MED ORDER — HYDROCODONE-ACETAMINOPHEN 5-325 MG PO TABS: 1.0000 | ORAL_TABLET | Freq: Three times a day (TID) | ORAL | 0 refills | Status: DC | PRN

## 2022-10-06 NOTE — Telephone Encounter (Signed)
done

## 2022-10-06 NOTE — Telephone Encounter (Signed)
Patient requesting Ibuprofen 800 mg along with other 3 medications from yesterday to also be sent to [facility] pharmacy on [address] please advise

## 2022-10-06 NOTE — Telephone Encounter (Signed)
Called and LMOM that medication has been sent to pharmacy.

## 2022-10-06 NOTE — Telephone Encounter (Signed)
Pt called requesting his hydrocodone be sent to [facility] on [address]. Medication was sent to [facility] 5/14 but asking for it to be sent to [facility]. Please call pt when sent to update pharmacy. Pt phone number is [phone].

## 2022-10-06 NOTE — Telephone Encounter (Signed)
approve

## 2022-10-07 ENCOUNTER — Other Ambulatory Visit: Payer: Self-pay | Admitting: Physician Assistant

## 2022-10-07 ENCOUNTER — Telehealth: Payer: Self-pay | Admitting: Orthopaedic Surgery

## 2022-10-07 NOTE — Telephone Encounter (Signed)
Called patient. No answer. LMOM with information from Dr.Xu concerning MRI.

## 2022-10-07 NOTE — Telephone Encounter (Signed)
Pt is already scheduled

## 2022-10-07 NOTE — Telephone Encounter (Signed)
If he does not have any focal motor weakness or signs or symptoms of cauda equina, then MRI will be denied by his insurance.  If he has developed any new weakness or findings, then he should come back to the office for reevaluation.

## 2022-10-07 NOTE — Telephone Encounter (Signed)
Patient called Darin Hart back. Says he has a different insurance now and the MRI should be covered. His cb# is [phone]

## 2022-10-07 NOTE — Telephone Encounter (Signed)
All of these insurances have the same rules about 6 weeks of conservative treatment.

## 2022-10-07 NOTE — Telephone Encounter (Signed)
Looks like xu already sent in

## 2022-10-07 NOTE — Telephone Encounter (Signed)
Patient would like work note excusing him from work today he got his pain medication late because it had to be sent to the correct pharmacy and he would like his MRI referral for his back to be sent in please advise

## 2022-10-08 ENCOUNTER — Telehealth: Payer: Self-pay | Admitting: Orthopaedic Surgery

## 2022-10-08 NOTE — Telephone Encounter (Signed)
Called and left message again that MRI would not be approved for lower back without 6 weeks of conservative therapy first. Ask him to call if he has any questions.

## 2022-10-08 NOTE — Telephone Encounter (Signed)
Patient called advised he wanted an MRI on his back as well. The number to contact patient is [phone]

## 2022-10-08 NOTE — Telephone Encounter (Signed)
Called. No answer. LMOM

## 2022-10-12 ENCOUNTER — Ambulatory Visit: Payer: 59 | Admitting: Orthopaedic Surgery

## 2022-10-12 DIAGNOSIS — G8929 Other chronic pain: Secondary | ICD-10-CM

## 2022-10-12 DIAGNOSIS — M5442 Lumbago with sciatica, left side: Secondary | ICD-10-CM

## 2022-10-12 NOTE — Progress Notes (Signed)
Office Visit Note   Patient: Darin Hart           Date of Birth: [birth year]           MRN: [id number] Visit Date: 10/12/2022              Requested by: Georgina Quint, MD [address] [place],  [place] [address] PCP: Georgina Quint, MD   Assessment & Plan: Visit Diagnoses:  1. Chronic left-sided low back pain with left-sided sciatica     Plan: Impression is left-sided low back pain with left lower extremity weakness which has progressively worsened since starting prescription steroids and muscle relaxers over a week ago.  At this point, I would like to order an MRI to assess for structural abnormalities.  We will call him with the results.  Follow-Up Instructions: Return if symptoms worsen or fail to improve.   Orders:  Orders Placed This Encounter  Procedures   MR Lumbar Spine w/o contrast   No orders of the defined types were placed in this encounter.     Procedures: No procedures performed   Clinical Data: No additional findings.   Subjective: Chief Complaint  Patient presents with   Lower Back - Follow-up    HPI patient is a pleasant 52 year old gentleman who comes in today with continued low back pain.  He was seen in our office little over a week ago for this.  He was having significant pain in the left low back with weakness in the left leg.  He was started on a steroid pack muscle relaxer.  His symptoms have continued despite these medications.  In fact, he notes increased weakness today to the left leg to the point where his leg is giving way.  No bowel or bladder change or saddle paresthesias.  Review of Systems as detailed in HPI.  All others reviewed and are negative.   Objective: Vital Signs: There were no vitals taken for this visit.  Physical Exam well-developed well-nourished gentleman in no acute distress.  Alert and oriented x 3.  Ortho Exam lumbar spine exam is unchanged  Specialty Comments:  No specialty comments  available.  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   Chronic musculoskeletal pain 10/02/2020   History of asthma 10/02/2020   History of multiple allergies 10/02/2020   Low testosterone 02/07/2018   Acute pain of right shoulder 01/30/2018   Past Medical History:  Diagnosis Date   Arthritis    knees   Asthma    Biceps rupture, proximal    right   Heart murmur    as child    Family History  Problem Relation Age of Onset   Hypertension Mother    Other Neg Hx        hypogonadism    Past Surgical History:  Procedure Laterality Date   BICEPT TENODESIS Right 09/30/2021   Procedure: RIGHT BICEPS TENODESIS;  Surgeon: Tarry Kos, MD;  Location: [facility];  Service: Orthopedics;  Laterality: Right;   HERNIA REPAIR     ORIF FINGER / THUMB FRACTURE Right    RHINOPLASTY     ROTATOR CUFF REPAIR Right    Spontaneous pneumothorax Left    20 yrs ago   Social History   Occupational History  Not on file  Tobacco Use   Smoking status: Never   Smokeless tobacco: Never  Vaping Use   Vaping Use: Never used  Substance and Sexual Activity   Alcohol use: Yes    Comment: OCCASIONALLY   Drug use: Never   Sexual activity: Yes    Partners: Female

## 2022-10-19 ENCOUNTER — Encounter: Payer: Self-pay | Admitting: Emergency Medicine

## 2022-10-19 ENCOUNTER — Ambulatory Visit (INDEPENDENT_AMBULATORY_CARE_PROVIDER_SITE_OTHER): Payer: 59 | Admitting: Emergency Medicine

## 2022-10-19 VITALS — BP 118/78 | HR 75 | Temp 98.0°F | Ht 72.0 in | Wt 180.0 lb

## 2022-10-19 DIAGNOSIS — L609 Nail disorder, unspecified: Secondary | ICD-10-CM | POA: Insufficient documentation

## 2022-10-19 DIAGNOSIS — R399 Unspecified symptoms and signs involving the genitourinary system: Secondary | ICD-10-CM | POA: Diagnosis not present

## 2022-10-19 DIAGNOSIS — E291 Testicular hypofunction: Secondary | ICD-10-CM | POA: Insufficient documentation

## 2022-10-19 LAB — CBC WITH DIFFERENTIAL/PLATELET
Basophils Absolute: 0 10*3/uL (ref 0.0–0.1)
Basophils Relative: 0.8 % (ref 0.0–3.0)
Eosinophils Absolute: 0.2 10*3/uL (ref 0.0–0.7)
Eosinophils Relative: 4 % (ref 0.0–5.0)
HCT: 41.3 % (ref 39.0–52.0)
Hemoglobin: 13.6 g/dL (ref 13.0–17.0)
Lymphocytes Relative: 34 % (ref 12.0–46.0)
Lymphs Abs: 1.6 10*3/uL (ref 0.7–4.0)
MCHC: 33 g/dL (ref 30.0–36.0)
MCV: 92.1 fl (ref 78.0–100.0)
Monocytes Absolute: 0.4 10*3/uL (ref 0.1–1.0)
Monocytes Relative: 9.1 % (ref 3.0–12.0)
Neutro Abs: 2.4 10*3/uL (ref 1.4–7.7)
Neutrophils Relative %: 52.1 % (ref 43.0–77.0)
Platelets: 193 10*3/uL (ref 150.0–400.0)
RBC: 4.49 Mil/uL (ref 4.22–5.81)
RDW: 13.3 % (ref 11.5–15.5)
WBC: 4.6 10*3/uL (ref 4.0–10.5)

## 2022-10-19 LAB — COMPREHENSIVE METABOLIC PANEL
ALT: 36 U/L (ref 0–53)
AST: 21 U/L (ref 0–37)
Albumin: 4.3 g/dL (ref 3.5–5.2)
Alkaline Phosphatase: 63 U/L (ref 39–117)
BUN: 23 mg/dL (ref 6–23)
CO2: 29 mEq/L (ref 19–32)
Calcium: 9.5 mg/dL (ref 8.4–10.5)
Chloride: 104 mEq/L (ref 96–112)
Creatinine, Ser: 1.18 mg/dL (ref 0.40–1.50)
GFR: 71.16 mL/min (ref >60.00)
Glucose, Bld: 89 mg/dL (ref 70–99)
Potassium: 3.9 mEq/L (ref 3.5–5.1)
Sodium: 141 mEq/L (ref 135–145)
Total Bilirubin: 0.3 mg/dL (ref 0.2–1.2)
Total Protein: 6.9 g/dL (ref 6.0–8.3)

## 2022-10-19 LAB — LIPID PANEL
Cholesterol: 194 mg/dL (ref 0–200)
HDL: 56.8 mg/dL (ref >39.00)
LDL Cholesterol: 120 mg/dL — ABNORMAL HIGH (ref 0–99)
NonHDL: 137.1
Total CHOL/HDL Ratio: 3
Triglycerides: 84 mg/dL (ref 0.0–149.0)
VLDL: 16.8 mg/dL (ref 0.0–40.0)

## 2022-10-19 LAB — VITAMIN B12: Vitamin B-12: 902 pg/mL (ref 211–911)

## 2022-10-19 LAB — TSH: TSH: 1.35 u[IU]/mL (ref 0.35–5.50)

## 2022-10-19 LAB — TESTOSTERONE: Testosterone: 248.26 ng/dL — ABNORMAL LOW (ref 300.00–890.00)

## 2022-10-19 LAB — VITAMIN D 25 HYDROXY (VIT D DEFICIENCY, FRACTURES): VITD: 34.01 ng/mL (ref 30.00–100.00)

## 2022-10-19 LAB — HEMOGLOBIN A1C: Hgb A1c MFr Bld: 5.6 % (ref 4.6–6.5)

## 2022-10-19 NOTE — Assessment & Plan Note (Signed)
Low testosterone level 1 year ago.  Will repeat today May need testosterone supplementation

## 2022-10-19 NOTE — Patient Instructions (Signed)
Health Maintenance, Male Adopting a healthy lifestyle and getting preventive care are important in promoting health and wellness. Ask your health care provider about: The right schedule for you to have regular tests and exams. Things you can do on your own to prevent diseases and keep yourself healthy. What should I know about diet, weight, and exercise? Eat a healthy diet  Eat a diet that includes plenty of vegetables, fruits, low-fat dairy products, and lean protein. Do not eat a lot of foods that are high in solid fats, added sugars, or sodium. Maintain a healthy weight Body mass index (BMI) is a measurement that can be used to identify possible weight problems. It estimates body fat based on height and weight. Your health care provider can help determine your BMI and help you achieve or maintain a healthy weight. Get regular exercise Get regular exercise. This is one of the most important things you can do for your health. Most adults should: Exercise for at least 150 minutes each week. The exercise should increase your heart rate and make you sweat (moderate-intensity exercise). Do strengthening exercises at least twice a week. This is in addition to the moderate-intensity exercise. Spend less time sitting. Even light physical activity can be beneficial. Watch cholesterol and blood lipids Have your blood tested for lipids and cholesterol at 52 years of age, then have this test every 5 years. You may need to have your cholesterol levels checked more often if: Your lipid or cholesterol levels are high. You are older than 52 years of age. You are at high risk for heart disease. What should I know about cancer screening? Many types of cancers can be detected early and may often be prevented. Depending on your health history and family history, you may need to have cancer screening at various ages. This may include screening for: Colorectal cancer. Prostate cancer. Skin cancer. Lung  cancer. What should I know about heart disease, diabetes, and high blood pressure? Blood pressure and heart disease High blood pressure causes heart disease and increases the risk of stroke. This is more likely to develop in people who have high blood pressure readings or are overweight. Talk with your health care provider about your target blood pressure readings. Have your blood pressure checked: Every 3-5 years if you are 18-39 years of age. Every year if you are 40 years old or older. If you are between the ages of 65 and 75 and are a current or former smoker, ask your health care provider if you should have a one-time screening for abdominal aortic aneurysm (AAA). Diabetes Have regular diabetes screenings. This checks your fasting blood sugar level. Have the screening done: Once every three years after age 45 if you are at a normal weight and have a low risk for diabetes. More often and at a younger age if you are overweight or have a high risk for diabetes. What should I know about preventing infection? Hepatitis B If you have a higher risk for hepatitis B, you should be screened for this virus. Talk with your health care provider to find out if you are at risk for hepatitis B infection. Hepatitis C Blood testing is recommended for: Everyone born from 1945 through 1965. Anyone with known risk factors for hepatitis C. Sexually transmitted infections (STIs) You should be screened each year for STIs, including gonorrhea and chlamydia, if: You are sexually active and are younger than 52 years of age. You are older than 52 years of age and your   health care provider tells you that you are at risk for this type of infection. Your sexual activity has changed since you were last screened, and you are at increased risk for chlamydia or gonorrhea. Ask your health care provider if you are at risk. Ask your health care provider about whether you are at high risk for HIV. Your health care provider  may recommend a prescription medicine to help prevent HIV infection. If you choose to take medicine to prevent HIV, you should first get tested for HIV. You should then be tested every 3 months for as long as you are taking the medicine. Follow these instructions at home: Alcohol use Do not drink alcohol if your health care provider tells you not to drink. If you drink alcohol: Limit how much you have to 0-2 drinks a day. Know how much alcohol is in your drink. In the [place] one drink equals one 12 oz bottle of beer (355 mL), one 5 oz glass of wine (148 mL), or one 1 oz glass of hard liquor (44 mL). Lifestyle Do not use any products that contain nicotine or tobacco. These products include cigarettes, chewing tobacco, and vaping devices, such as e-cigarettes. If you need help quitting, ask your health care provider. Do not use street drugs. Do not share needles. Ask your health care provider for help if you need support or information about quitting drugs. General instructions Schedule regular health, dental, and eye exams. Stay current with your vaccines. Tell your health care provider if: You often feel depressed. You have ever been abused or do not feel safe at home. Summary Adopting a healthy lifestyle and getting preventive care are important in promoting health and wellness. Follow your health care provider's instructions about healthy diet, exercising, and getting tested or screened for diseases. Follow your health care provider's instructions on monitoring your cholesterol and blood pressure. This information is not intended to replace advice given to you by your health care provider. Make sure you discuss any questions you have with your health care provider. Document Revised: 09/29/2020 Document Reviewed: 09/29/2020 Elsevier Patient Education  2024 Elsevier Inc.  

## 2022-10-19 NOTE — Progress Notes (Signed)
Darin Hart 52 y.o.   Chief Complaint  Patient presents with   finger nail    Right thumb nail looks off to him and wants it looked at states it been looking like that for awhile. Does not have any pain.  Had questions about Testerone labs that were done last year and never got a call from anyone.     HISTORY OF PRESENT ILLNESS: Acute problem visit today. This is a 52 y.o. male complaining of fingernail discoloration and deformity for the last couple months Also inquiring about his testosterone level done last year Also complaining of urinary frequency and nocturia.  Requesting referral to urologist for prostate evaluation No other complaints or medical concerns today.  HPI   Prior to Admission medications   Medication Sig Start Date End Date Taking? Authorizing Provider  albuterol (VENTOLIN HFA) 108 (90 Base) MCG/ACT inhaler Inhale 2 puffs into the lungs every 6 (six) hours as needed for wheezing or shortness of breath. 01/13/22  Yes Effie Janoski, Eilleen Kempf, MD  diclofenac (VOLTAREN) 75 MG EC tablet Take 1 tablet (75 mg total) by mouth 2 (two) times daily. 11/10/21  Yes Cristie Hem, PA-C  HYDROcodone-acetaminophen (NORCO) 5-325 MG tablet Take 1 tablet by mouth 3 (three) times daily as needed. 10/27/21  Yes Cristie Hem, PA-C  ibuprofen (ADVIL) 800 MG tablet Take 1 tablet (800 mg total) by mouth every 8 (eight) hours as needed. 10/06/22  Yes Tarry Kos, MD  methocarbamol (ROBAXIN) 500 MG tablet Take 500 mg by mouth 3 (three) times daily. 06/07/22  Yes [provider]  methocarbamol (ROBAXIN-750) 750 MG tablet Take 1 tablet (750 mg total) by mouth 3 (three) times daily as needed for muscle spasms. 10/05/22  Yes Cristie Hem, PA-C  Multiple Vitamin (MULTIVITAMIN WITH MINERALS) TABS tablet Take 1 tablet by mouth daily.   Yes [provider]  HYDROcodone-acetaminophen (NORCO) 5-325 MG tablet Take 1 tablet by mouth 3 (three) times daily as needed. Patient not  taking: Reported on 10/19/2022 10/06/22   Tarry Kos, MD  ondansetron (ZOFRAN) 4 MG tablet Take 1-2 tablets (4-8 mg total) by mouth every 8 (eight) hours as needed for nausea or vomiting. Patient not taking: Reported on 10/19/2022 09/30/21   Tarry Kos, MD  predniSONE (STERAPRED UNI-PAK 21 TAB) 10 MG (21) TBPK tablet Take as directed Patient not taking: Reported on 10/19/2022 10/05/22   Cristie Hem, PA-C    Allergies  Allergen Reactions   Shellfish Allergy Anaphylaxis   Other Other (See Comments)    Migraines, itchy tongue, burning mouth All Tree Nuts   Penicillins     Pt was told he could not take this, his reaction is unknown Has patient had a PCN reaction causing immediate rash, facial/tongue/throat swelling, SOB or lightheadedness with hypotension: Unknown Has patient had a PCN reaction causing severe rash involving mucus membranes or skin necrosis: Unknown Has patient had a PCN reaction that required hospitalization: NO Has patient had a PCN reaction occurring within the last 10 years: NO If all of the above answers are "NO", then may proceed with Cephalospo    Patient Active Problem List   Diagnosis Date Noted   Injury of right shoulder 08/12/2022   History of inguinal hernia repair 06/16/2022   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   Chronic musculoskeletal pain 10/02/2020   History of asthma 10/02/2020   History of multiple allergies 10/02/2020   Low testosterone 02/07/2018   Acute  pain of right shoulder 01/30/2018    Past Medical History:  Diagnosis Date   Arthritis    knees   Asthma    Biceps rupture, proximal    right   Heart murmur    as child    Past Surgical History:  Procedure Laterality Date   BICEPT TENODESIS Right 09/30/2021   Procedure: RIGHT BICEPS TENODESIS;  Surgeon: Tarry Kos, MD;  Location: [facility];  Service: Orthopedics;  Laterality: Right;   HERNIA REPAIR     ORIF FINGER / THUMB FRACTURE Right     RHINOPLASTY     ROTATOR CUFF REPAIR Right    Spontaneous pneumothorax Left    20 yrs ago    Social History   Socioeconomic History   Marital status: Married    Spouse name: Not on file   Number of children: Not on file   Years of education: Not on file   Highest education level: Not on file  Occupational History   Not on file  Tobacco Use   Smoking status: Never   Smokeless tobacco: Never  Vaping Use   Vaping Use: Never used  Substance and Sexual Activity   Alcohol use: Yes    Comment: OCCASIONALLY   Drug use: Never   Sexual activity: Yes    Partners: Female  Other Topics Concern   Not on file  Social History Narrative   Not on file   Social Determinants of Health   Financial Resource Strain: Low Risk  (12/05/2018)   Overall Financial Resource Strain (CARDIA)    Difficulty of Paying Living Expenses: Not hard at all  Food Insecurity: No Food Insecurity (12/05/2018)   Hunger Vital Sign    Worried About Running Out of Food in the Last Year: Never true    Ran Out of Food in the Last Year: Never true  Transportation Needs: No Transportation Needs (12/05/2018)   PRAPARE - Administrator, Civil Service (Medical): No    Lack of Transportation (Non-Medical): No  Physical Activity: Not on file  Stress: Stress Concern Present (12/05/2018)   [employer] of Occupational Health - Occupational Stress Questionnaire    Feeling of Stress : To some extent  Social Connections: Unknown (12/05/2018)   Social Connection and Isolation Panel [NHANES]    Frequency of Communication with Friends and Family: More than three times a week    Frequency of Social Gatherings with Friends and Family: More than three times a week    Attends Religious Services: Not on file    Active Member of Clubs or Organizations: Not on file    Attends Banker Meetings: Not on file    Marital Status: Married  Intimate Partner Violence: Not At Risk (12/05/2018)   Humiliation, Afraid,  Rape, and Kick questionnaire    Fear of Current or Ex-Partner: No    Emotionally Abused: No    Physically Abused: No    Sexually Abused: No    Family History  Problem Relation Age of Onset   Hypertension Mother    Other Neg Hx        hypogonadism     Review of Systems  Constitutional: Negative.  Negative for chills and fever.  HENT: Negative.  Negative for congestion and sore throat.   Respiratory: Negative.  Negative for cough and shortness of breath.   Cardiovascular: Negative.  Negative for chest pain and palpitations.  Gastrointestinal:  Negative for abdominal pain, diarrhea, nausea and vomiting.  Genitourinary:  Positive for frequency.       Nocturia  Skin: Negative.  Negative for rash.  Neurological: Negative.  Negative for dizziness and headaches.    Vitals:   10/19/22 1420  BP: 118/78  Pulse: 75  Temp: 98 F (36.7 C)  SpO2: 96%    Physical Exam Vitals reviewed.  Constitutional:      Appearance: Normal appearance.  HENT:     Head: Normocephalic.  Eyes:     Extraocular Movements: Extraocular movements intact.     Pupils: Pupils are equal, round, and reactive to light.  Cardiovascular:     Rate and Rhythm: Normal rate and regular rhythm.     Pulses: Normal pulses.     Heart sounds: Normal heart sounds.  Pulmonary:     Effort: Pulmonary effort is normal.     Breath sounds: Normal breath sounds.  Musculoskeletal:     Cervical back: No tenderness.     Comments: Right thumb: Nail ridges with deformity, possible fungal infection  Lymphadenopathy:     Cervical: No cervical adenopathy.  Skin:    General: Skin is warm and dry.     Capillary Refill: Capillary refill takes less than 2 seconds.  Neurological:     General: No focal deficit present.     Mental Status: He is alert and oriented to person, place, and time.  Psychiatric:        Mood and Affect: Mood normal.        Behavior: Behavior normal.      ASSESSMENT & PLAN: A total of 35 minutes was  spent with the patient and counseling/coordination of care regarding preparing for this visit, review of most recent office visit notes, review of chronic medical conditions, review of all medications, need for blood work today, prognosis, documentation and need for follow-up.  Problem List Items Addressed This Visit       Endocrine   Hypogonadism in male    Low testosterone level 1 year ago.  Will repeat today May need testosterone supplementation      Relevant Orders   Testosterone     Other   Nail problem - Primary    Unclear etiology Likely fungal infection May reflect systemic problem or vitamin deficiency. Blood work done today May need dermatology evaluation      Relevant Orders   TSH   Vitamin B12   VITAMIN D 25 Hydroxy (Vit-D Deficiency, Fractures)   Comprehensive metabolic panel   CBC with Differential/Platelet   Hemoglobin A1c   Lipid panel   Vitamin B6   Vitamin B1   Lower urinary tract symptoms (LUTS)    PSA done today Recommend urology evaluation Referral placed today.      Relevant Orders   Ambulatory referral to Urology   PSA   Patient Instructions  Health Maintenance, Male Adopting a healthy lifestyle and getting preventive care are important in promoting health and wellness. Ask your health care provider about: The right schedule for you to have regular tests and exams. Things you can do on your own to prevent diseases and keep yourself healthy. What should I know about diet, weight, and exercise? Eat a healthy diet  Eat a diet that includes plenty of vegetables, fruits, low-fat dairy products, and lean protein. Do not eat a lot of foods that are high in solid fats, added sugars, or sodium. Maintain a healthy weight Body mass index (BMI) is a measurement that can be used to identify possible weight problems. It estimates body fat  based on height and weight. Your health care provider can help determine your BMI and help you achieve or maintain a  healthy weight. Get regular exercise Get regular exercise. This is one of the most important things you can do for your health. Most adults should: Exercise for at least 150 minutes each week. The exercise should increase your heart rate and make you sweat (moderate-intensity exercise). Do strengthening exercises at least twice a week. This is in addition to the moderate-intensity exercise. Spend less time sitting. Even light physical activity can be beneficial. Watch cholesterol and blood lipids Have your blood tested for lipids and cholesterol at 52 years of age, then have this test every 5 years. You may need to have your cholesterol levels checked more often if: Your lipid or cholesterol levels are high. You are older than 52 years of age. You are at high risk for heart disease. What should I know about cancer screening? Many types of cancers can be detected early and may often be prevented. Depending on your health history and family history, you may need to have cancer screening at various ages. This may include screening for: Colorectal cancer. Prostate cancer. Skin cancer. Lung cancer. What should I know about heart disease, diabetes, and high blood pressure? Blood pressure and heart disease High blood pressure causes heart disease and increases the risk of stroke. This is more likely to develop in people who have high blood pressure readings or are overweight. Talk with your health care provider about your target blood pressure readings. Have your blood pressure checked: Every 3-5 years if you are 71-6 years of age. Every year if you are 65 years old or older. If you are between the ages of 11 and 3 and are a current or former smoker, ask your health care provider if you should have a one-time screening for abdominal aortic aneurysm (AAA). Diabetes Have regular diabetes screenings. This checks your fasting blood sugar level. Have the screening done: Once every three years after  age 36 if you are at a normal weight and have a low risk for diabetes. More often and at a younger age if you are overweight or have a high risk for diabetes. What should I know about preventing infection? Hepatitis B If you have a higher risk for hepatitis B, you should be screened for this virus. Talk with your health care provider to find out if you are at risk for hepatitis B infection. Hepatitis C Blood testing is recommended for: Everyone born from [birth year] through 1965. Anyone with known risk factors for hepatitis C. Sexually transmitted infections (STIs) You should be screened each year for STIs, including gonorrhea and chlamydia, if: You are sexually active and are younger than 52 years of age. You are older than 52 years of age and your health care provider tells you that you are at risk for this type of infection. Your sexual activity has changed since you were last screened, and you are at increased risk for chlamydia or gonorrhea. Ask your health care provider if you are at risk. Ask your health care provider about whether you are at high risk for HIV. Your health care provider may recommend a prescription medicine to help prevent HIV infection. If you choose to take medicine to prevent HIV, you should first get tested for HIV. You should then be tested every 3 months for as long as you are taking the medicine. Follow these instructions at home: Alcohol use Do not drink alcohol if  your health care provider tells you not to drink. If you drink alcohol: Limit how much you have to 0-2 drinks a day. Know how much alcohol is in your drink. In the [place] one drink equals one 12 oz bottle of beer (355 mL), one 5 oz glass of wine (148 mL), or one 1 oz glass of hard liquor (44 mL). Lifestyle Do not use any products that contain nicotine or tobacco. These products include cigarettes, chewing tobacco, and vaping devices, such as e-cigarettes. If you need help quitting, ask your health care  provider. Do not use street drugs. Do not share needles. Ask your health care provider for help if you need support or information about quitting drugs. General instructions Schedule regular health, dental, and eye exams. Stay current with your vaccines. Tell your health care provider if: You often feel depressed. You have ever been abused or do not feel safe at home. Summary Adopting a healthy lifestyle and getting preventive care are important in promoting health and wellness. Follow your health care provider's instructions about healthy diet, exercising, and getting tested or screened for diseases. Follow your health care provider's instructions on monitoring your cholesterol and blood pressure. This information is not intended to replace advice given to you by your health care provider. Make sure you discuss any questions you have with your health care provider. Document Revised: 09/29/2020 Document Reviewed: 09/29/2020 Elsevier Patient Education  2024 Elsevier Inc.    Edwina Barth, MD [facility]

## 2022-10-19 NOTE — Assessment & Plan Note (Signed)
Unclear etiology Likely fungal infection May reflect systemic problem or vitamin deficiency. Blood work done today May need dermatology evaluation

## 2022-10-19 NOTE — Assessment & Plan Note (Signed)
PSA done today Recommend urology evaluation Referral placed today.

## 2022-10-20 ENCOUNTER — Other Ambulatory Visit: Payer: Self-pay | Admitting: Emergency Medicine

## 2022-10-20 DIAGNOSIS — E291 Testicular hypofunction: Secondary | ICD-10-CM

## 2022-10-20 MED ORDER — XYOSTED 75 MG/0.5ML ~~LOC~~ SOAJ
75.0000 mg | SUBCUTANEOUS | 5 refills | Status: DC
Start: 2022-10-20 — End: 2022-11-23

## 2022-10-22 ENCOUNTER — Ambulatory Visit
Admission: RE | Admit: 2022-10-22 | Discharge: 2022-10-22 | Disposition: A | Discharge status: Home / Self Care | Payer: 59 | Source: Ambulatory Visit | Attending: Orthopaedic Surgery | Admitting: Orthopaedic Surgery

## 2022-10-22 ENCOUNTER — Encounter: Payer: Self-pay | Admitting: Emergency Medicine

## 2022-10-22 DIAGNOSIS — G8929 Other chronic pain: Secondary | ICD-10-CM

## 2022-10-22 DIAGNOSIS — M5126 Other intervertebral disc displacement, lumbar region: Secondary | ICD-10-CM | POA: Diagnosis not present

## 2022-10-22 DIAGNOSIS — M19011 Primary osteoarthritis, right shoulder: Secondary | ICD-10-CM | POA: Diagnosis not present

## 2022-10-22 DIAGNOSIS — M25511 Pain in right shoulder: Secondary | ICD-10-CM | POA: Diagnosis not present

## 2022-10-22 LAB — VITAMIN B6: Vitamin B6: 62 ng/mL — ABNORMAL HIGH (ref 2.1–21.7)

## 2022-10-22 LAB — VITAMIN B1: Vitamin B1 (Thiamine): 22 nmol/L (ref 8–30)

## 2022-10-27 ENCOUNTER — Other Ambulatory Visit: Payer: Self-pay | Admitting: Emergency Medicine

## 2022-10-27 ENCOUNTER — Ambulatory Visit: Payer: 59 | Admitting: Orthopaedic Surgery

## 2022-10-27 DIAGNOSIS — G8929 Other chronic pain: Secondary | ICD-10-CM | POA: Diagnosis not present

## 2022-10-27 DIAGNOSIS — M545 Low back pain, unspecified: Secondary | ICD-10-CM | POA: Diagnosis not present

## 2022-10-27 DIAGNOSIS — M25511 Pain in right shoulder: Secondary | ICD-10-CM

## 2022-10-27 NOTE — Telephone Encounter (Signed)
He needs to find out from his insurance what testosterone supplementation is covered and let us know.  Thanks.

## 2022-10-27 NOTE — Progress Notes (Signed)
Office Visit Note   Patient: Darin Hart           Date of Birth: December 04, 1970           MRN: [id number] Visit Date: 10/27/2022              Requested by: Georgina Quint, MD [address] [place],  [place] [address] PCP: Georgina Quint, MD   Assessment & Plan: Visit Diagnoses:  1. Chronic right shoulder pain   2. Acute low back pain, unspecified back pain laterality, unspecified whether sciatica present     Plan: Impression is chronic right shoulder pain and low back pain with radiculopathy.  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  MRI of the right shoulder is nearly unremarkable with the exception of moderate AC degenerative changes.  Previous biceps tenodesis is unremarkable.  In regards to the right shoulder, it seems that most of his symptoms are coming from a lack of strength.  In regards to his lumbar spine, we have discussed proceeding with physical therapy versus referral to Dr. Alvester Morin for [facility].  He would like to try physical therapy for both his right shoulder and his lumbar spine.  I provided him a paper prescription for this as he is not sure where he would like to go.  If his symptoms in his back do not improve, he will let us know we will put in a referral for Dr. Alvester Morin for an injection.  Otherwise, follow-up as needed.  Follow-Up Instructions: Return if symptoms worsen or fail to improve.   Orders:  No orders of the defined types were placed in this encounter.  No orders of the defined types were placed in this encounter.     Procedures: No procedures performed   Clinical Data: No additional findings.   Subjective: Chief Complaint  Patient presents with   Right Shoulder - Follow-up   Lower Back - Follow-up    HPI patient is a pleasant 52 year old gentleman who comes in today to review MRI results of the lumbar spine as well as the right shoulder.  Lumbar spine  MRI shows broad-based disc protrusion L4-5 likely affecting the L5 nerve root.  MRI of the right shoulder is nearly unremarkable with the exception of moderate AC degenerative changes.  Previous biceps tenodesis is unremarkable.     Objective: Vital Signs: There were no vitals taken for this visit.    Ortho Exam unchanged right shoulder and lumbar spine exam  Specialty Comments:  No specialty comments available.  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   History of asthma 10/02/2020   History of multiple allergies 10/02/2020   Low testosterone 02/07/2018   Past Medical History:  Diagnosis Date   Arthritis    knees   Asthma    Biceps rupture, proximal    right   Heart murmur    as child    Family History  Problem Relation Age of Onset   Hypertension Mother    Other Neg Hx        hypogonadism    Past Surgical History:  Procedure Laterality Date   BICEPT TENODESIS Right 09/30/2021  Procedure: RIGHT BICEPS TENODESIS;  Surgeon: Tarry Kos, MD;  Location: [facility];  Service: Orthopedics;  Laterality: Right;   HERNIA REPAIR     ORIF FINGER / THUMB FRACTURE Right    RHINOPLASTY     ROTATOR CUFF REPAIR Right    Spontaneous pneumothorax Left    20 yrs ago   Social History   Occupational History   Not on file  Tobacco Use   Smoking status: Never   Smokeless tobacco: Never  Vaping Use   Vaping Use: Never used  Substance and Sexual Activity   Alcohol use: Yes    Comment: OCCASIONALLY   Drug use: Never   Sexual activity: Yes    Partners: Female

## 2022-10-28 DIAGNOSIS — M79645 Pain in left finger(s): Secondary | ICD-10-CM | POA: Diagnosis not present

## 2022-11-12 DIAGNOSIS — M25511 Pain in right shoulder: Secondary | ICD-10-CM | POA: Diagnosis not present

## 2022-11-12 DIAGNOSIS — M545 Low back pain, unspecified: Secondary | ICD-10-CM | POA: Diagnosis not present

## 2022-11-19 DIAGNOSIS — M25511 Pain in right shoulder: Secondary | ICD-10-CM | POA: Diagnosis not present

## 2022-11-19 DIAGNOSIS — M545 Low back pain, unspecified: Secondary | ICD-10-CM | POA: Diagnosis not present

## 2022-11-22 ENCOUNTER — Telehealth: Payer: Self-pay | Admitting: *Deleted

## 2022-11-22 NOTE — Telephone Encounter (Signed)
Patient came to the office and stated he needs a doctors note stating his disability for his work. Patient recently got a handicap placard signed by provider and his employer is not allowing him to park in the handicap parking spot. They are requesting a letter from the provider. Patient states he has chronic back issues/pain. Please advise on work note    Patient also wants an alternative to the testosterone injection. He states maybe a cream. Insurance is not covering the injection

## 2022-11-22 NOTE — Telephone Encounter (Signed)
Okay to provide work note as requested.  Thanks.

## 2022-11-23 ENCOUNTER — Encounter: Payer: Self-pay | Admitting: Internal Medicine

## 2022-11-23 ENCOUNTER — Other Ambulatory Visit: Payer: Self-pay | Admitting: Emergency Medicine

## 2022-11-23 ENCOUNTER — Ambulatory Visit (INDEPENDENT_AMBULATORY_CARE_PROVIDER_SITE_OTHER): Payer: 59 | Admitting: Internal Medicine

## 2022-11-23 ENCOUNTER — Encounter: Payer: Self-pay | Admitting: Emergency Medicine

## 2022-11-23 VITALS — BP 116/72 | HR 66 | Temp 98.6°F | Ht 72.0 in | Wt 178.0 lb

## 2022-11-23 DIAGNOSIS — J069 Acute upper respiratory infection, unspecified: Secondary | ICD-10-CM | POA: Diagnosis not present

## 2022-11-23 MED ORDER — TESTOSTERONE 20.25 MG/ACT (1.62%) TD GEL: 1.0000 | Freq: Every day | TRANSDERMAL | 1 refills | Status: DC

## 2022-11-23 NOTE — Progress Notes (Signed)
Subjective:    Patient ID: Darin Hart, male    DOB: 1970-10-19, 52 y.o.   MRN: [id number]      HPI Darin Hart is here for  Chief Complaint  Patient presents with   Nasal Congestion    Patient has not been feeling well since last Friday. Got worse on Sunday  (congestion, dry mouth, slight sinus headache on Saturday)    Cold symptoms - symptoms started 4 days ago.  He was driving and was eating oreo cookies - he started to feel funny.  He thought it was allergies.  He felt weird by the time he got to [place].  He had a sore throat, tired, weak, dizzy, headaches and fatigue.  Yesterday he had to leave work early.  He took theraflu.  He feels achy, nasal congestion.  He denies dizziness now.   He is taking theraflu, he takes benadryl but it makes him very sleepy.     Medications and allergies reviewed with patient and updated if appropriate.  Current Outpatient Medications on File Prior to Visit  Medication Sig Dispense Refill   albuterol (VENTOLIN HFA) 108 (90 Base) MCG/ACT inhaler Inhale 2 puffs into the lungs every 6 (six) hours as needed for wheezing or shortness of breath. 18 g 3   diclofenac (VOLTAREN) 75 MG EC tablet Take 1 tablet (75 mg total) by mouth 2 (two) times daily. 30 tablet 2   HYDROcodone-acetaminophen (NORCO) 5-325 MG tablet Take 1 tablet by mouth 3 (three) times daily as needed. 20 tablet 0   ibuprofen (ADVIL) 800 MG tablet Take 1 tablet (800 mg total) by mouth every 8 (eight) hours as needed. 30 tablet 0   methocarbamol (ROBAXIN) 500 MG tablet Take 500 mg by mouth 3 (three) times daily.     methocarbamol (ROBAXIN-750) 750 MG tablet Take 1 tablet (750 mg total) by mouth 3 (three) times daily as needed for muscle spasms. 30 tablet 2   Multiple Vitamin (MULTIVITAMIN WITH MINERALS) TABS tablet Take 1 tablet by mouth daily.     ondansetron (ZOFRAN) 4 MG tablet Take 1-2 tablets (4-8 mg total) by mouth every 8 (eight) hours as needed for nausea or vomiting. 20 tablet  0   Testosterone Enanthate (XYOSTED) 75 MG/0.5ML SOAJ Inject 75 mg into the skin once a week. 2 mL 5   No current facility-administered medications on file prior to visit.    Review of Systems  Constitutional:  Positive for fatigue. Negative for fever.  HENT:  Positive for congestion and sneezing. Negative for ear pain, postnasal drip, sinus pressure, sinus pain and sore throat (resolved).   Respiratory:  Negative for cough, shortness of breath and wheezing.   Gastrointestinal:  Negative for abdominal pain, diarrhea and nausea.  Musculoskeletal:  Positive for myalgias.  Neurological:  Negative for dizziness (resolved) and headaches (resolved).       Objective:   Vitals:   11/23/22 1550  BP: 116/72  Pulse: 66  Temp: 98.6 F (37 C)  SpO2: 99%   BP Readings from Last 3 Encounters:  11/23/22 116/72  10/19/22 118/78  08/12/22 124/82   Wt Readings from Last 3 Encounters:  11/23/22 178 lb (80.7 kg)  10/19/22 180 lb (81.6 kg)  08/12/22 182 lb 2 oz (82.6 kg)   Body mass index is 24.14 kg/m.    Physical Exam Constitutional:      General: He is not in acute distress.    Appearance: Normal appearance. He is not ill-appearing.  HENT:  Head: Normocephalic.     Right Ear: Tympanic membrane, ear canal and external ear normal. There is no impacted cerumen.     Left Ear: Tympanic membrane, ear canal and external ear normal. There is no impacted cerumen.     Mouth/Throat:     Mouth: Mucous membranes are moist.     Pharynx: No oropharyngeal exudate or posterior oropharyngeal erythema.  Eyes:     Conjunctiva/sclera: Conjunctivae normal.  Cardiovascular:     Rate and Rhythm: Normal rate and regular rhythm.  Pulmonary:     Effort: Pulmonary effort is normal. No respiratory distress.     Breath sounds: Normal breath sounds. No wheezing or rales.  Musculoskeletal:     Cervical back: Neck supple. No tenderness.  Lymphadenopathy:     Cervical: No cervical adenopathy.  Skin:     General: Skin is warm and dry.     Findings: No rash.  Neurological:     Mental Status: He is alert.            Assessment & Plan:    URI: Acute Symptoms likely viral in nature Less likely allergy related Advised symptomatic treatment with over-the-counter cold medications, allegy medications Increase rest and fluids Call if symptoms worsen or do not improve

## 2022-11-23 NOTE — Patient Instructions (Addendum)
      This may be a viral infection.   Keep taking theraflu, benadryl and other allergy medications or cold medication that have helped in the past.       Return if symptoms worsen or fail to improve.

## 2022-11-23 NOTE — Telephone Encounter (Signed)
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

## 2022-11-23 NOTE — Telephone Encounter (Signed)
Testosterone gel prescription sent to pharmacy of record today.

## 2022-11-23 NOTE — Telephone Encounter (Signed)
Patient also wants an alternative to the testosterone injection. He states maybe a cream. Insurance is not covering the injection

## 2022-11-24 NOTE — Telephone Encounter (Signed)
Called patient to inform him that a new prescription was sent to his pharmacy

## 2022-12-09 ENCOUNTER — Telehealth: Payer: Self-pay

## 2022-12-09 ENCOUNTER — Other Ambulatory Visit (HOSPITAL_COMMUNITY): Payer: Self-pay

## 2022-12-09 NOTE — Telephone Encounter (Signed)
Pharmacy Patient Advocate Encounter  Received notification from [employer] that Prior Authorization for Testosterone 1.62% has been APPROVED from 11/22/22 to 12/08/23.Marland Kitchen  PA #/Case ID/Reference #:  [id number]   Placed a call to [facility] pharmacy to notify of the approval. Left a voicemail. Per test claim, the copay for 30 days is $8.00.

## 2022-12-09 NOTE — Telephone Encounter (Signed)
Sent mychart message w/info.Marland KitchenRaechel Chute

## 2022-12-09 NOTE — Telephone Encounter (Signed)
Pharmacy Patient Advocate Encounter   Received notification from [employer] that prior authorization for Testosterone 1.62% gel is required/requested.   Insurance verification completed.   The patient is insured through [employer] .   Per test claim: PA submitted to [employer] via Cover[employer] Key/confirmation #/EOC [id number] Status is pending

## 2023-02-17 ENCOUNTER — Telehealth: Payer: Self-pay | Admitting: Orthopaedic Surgery

## 2023-02-17 NOTE — Telephone Encounter (Signed)
Letter created. Patent notified. Mailed.

## 2023-02-17 NOTE — Telephone Encounter (Signed)
yes

## 2023-02-17 NOTE — Telephone Encounter (Signed)
Pt would like a letter for employer stating he can work without limitations and he is released from care, pt would like this mailed to his home address on file

## 2023-03-24 ENCOUNTER — Telehealth: Payer: Self-pay | Admitting: Emergency Medicine

## 2023-03-24 MED ORDER — TESTOSTERONE 20.25 MG/ACT (1.62%) TD GEL: 1.0000 | Freq: Every day | TRANSDERMAL | 1 refills | Status: DC

## 2023-03-24 NOTE — Telephone Encounter (Signed)
Prescription Request  03/24/2023  LOV: 10/19/2022  What is the name of the medication or equipment? Testosterone 20.25 MG/ACT (1.62%) GEL   Have you contacted your pharmacy to request a refill? No   Which pharmacy would you like this sent to?  [facility] [address], [place] - [address] [place] [address] Phone: [phone] Fax: [phone]    Patient notified that their request is being sent to the clinical staff for review and that they should receive a response within 2 business days.   Please advise at Mobile [phone] (mobile)

## 2023-03-31 ENCOUNTER — Other Ambulatory Visit (HOSPITAL_COMMUNITY): Payer: Self-pay

## 2023-05-27 DIAGNOSIS — R351 Nocturia: Secondary | ICD-10-CM | POA: Diagnosis not present

## 2023-05-27 DIAGNOSIS — N401 Enlarged prostate with lower urinary tract symptoms: Secondary | ICD-10-CM | POA: Diagnosis not present

## 2023-05-27 DIAGNOSIS — N3943 Post-void dribbling: Secondary | ICD-10-CM | POA: Diagnosis not present

## 2023-05-27 DIAGNOSIS — N403 Nodular prostate with lower urinary tract symptoms: Secondary | ICD-10-CM | POA: Diagnosis not present

## 2023-06-02 ENCOUNTER — Encounter: Payer: Self-pay | Admitting: Emergency Medicine

## 2023-06-02 ENCOUNTER — Ambulatory Visit (INDEPENDENT_AMBULATORY_CARE_PROVIDER_SITE_OTHER): Payer: 59 | Admitting: Emergency Medicine

## 2023-06-02 VITALS — BP 120/88 | HR 102 | Temp 97.8°F | Ht 72.0 in | Wt 177.0 lb

## 2023-06-02 DIAGNOSIS — H579 Unspecified disorder of eye and adnexa: Secondary | ICD-10-CM | POA: Diagnosis not present

## 2023-06-02 DIAGNOSIS — E291 Testicular hypofunction: Secondary | ICD-10-CM | POA: Diagnosis not present

## 2023-06-02 DIAGNOSIS — L609 Nail disorder, unspecified: Secondary | ICD-10-CM

## 2023-06-02 DIAGNOSIS — B351 Tinea unguium: Secondary | ICD-10-CM | POA: Diagnosis not present

## 2023-06-02 DIAGNOSIS — R200 Anesthesia of skin: Secondary | ICD-10-CM | POA: Insufficient documentation

## 2023-06-02 DIAGNOSIS — Z8709 Personal history of other diseases of the respiratory system: Secondary | ICD-10-CM

## 2023-06-02 DIAGNOSIS — Z23 Encounter for immunization: Secondary | ICD-10-CM

## 2023-06-02 LAB — LIPID PANEL
Cholesterol: 213 mg/dL — ABNORMAL HIGH (ref 0–200)
HDL: 62.8 mg/dL (ref >39.00)
LDL Cholesterol: 128 mg/dL — ABNORMAL HIGH (ref 0–99)
NonHDL: 150.26
Total CHOL/HDL Ratio: 3
Triglycerides: 109 mg/dL (ref 0.0–149.0)
VLDL: 21.8 mg/dL (ref 0.0–40.0)

## 2023-06-02 LAB — CBC WITH DIFFERENTIAL/PLATELET
Basophils Absolute: 0 10*3/uL (ref 0.0–0.1)
Basophils Relative: 0.5 % (ref 0.0–3.0)
Eosinophils Absolute: 0.1 10*3/uL (ref 0.0–0.7)
Eosinophils Relative: 2 % (ref 0.0–5.0)
HCT: 41.6 % (ref 39.0–52.0)
Hemoglobin: 13.9 g/dL (ref 13.0–17.0)
Lymphocytes Relative: 33.9 % (ref 12.0–46.0)
Lymphs Abs: 1.5 10*3/uL (ref 0.7–4.0)
MCHC: 33.5 g/dL (ref 30.0–36.0)
MCV: 92.9 fL (ref 78.0–100.0)
Monocytes Absolute: 0.4 10*3/uL (ref 0.1–1.0)
Monocytes Relative: 8.9 % (ref 3.0–12.0)
Neutro Abs: 2.4 10*3/uL (ref 1.4–7.7)
Neutrophils Relative %: 54.7 % (ref 43.0–77.0)
Platelets: 193 10*3/uL (ref 150.0–400.0)
RBC: 4.48 Mil/uL (ref 4.22–5.81)
RDW: 13.1 % (ref 11.5–15.5)
WBC: 4.4 10*3/uL (ref 4.0–10.5)

## 2023-06-02 LAB — COMPREHENSIVE METABOLIC PANEL
ALT: 45 U/L (ref 0–53)
AST: 31 U/L (ref 0–37)
Albumin: 4.5 g/dL (ref 3.5–5.2)
Alkaline Phosphatase: 69 U/L (ref 39–117)
BUN: 17 mg/dL (ref 6–23)
CO2: 31 meq/L (ref 19–32)
Calcium: 9.3 mg/dL (ref 8.4–10.5)
Chloride: 101 meq/L (ref 96–112)
Creatinine, Ser: 1.12 mg/dL (ref 0.40–1.50)
GFR: 75.43 mL/min (ref >60.00)
Glucose, Bld: 97 mg/dL (ref 70–99)
Potassium: 3.6 meq/L (ref 3.5–5.1)
Sodium: 139 meq/L (ref 135–145)
Total Bilirubin: 0.3 mg/dL (ref 0.2–1.2)
Total Protein: 7.3 g/dL (ref 6.0–8.3)

## 2023-06-02 LAB — HEMOGLOBIN A1C: Hgb A1c MFr Bld: 5.8 % (ref 4.6–6.5)

## 2023-06-02 LAB — TESTOSTERONE: Testosterone: 331.9 ng/dL (ref 300.00–890.00)

## 2023-06-02 MED ORDER — TESTOSTERONE 20.25 MG/ACT (1.62%) TD GEL
1.0000 | Freq: Every day | TRANSDERMAL | 1 refills | Status: DC
Start: 2023-06-02 — End: 2023-12-26

## 2023-06-02 NOTE — Assessment & Plan Note (Signed)
Unclear etiology Likely fungal infection May reflect systemic problem or vitamin deficiency. Blood work done today May need dermatology evaluation

## 2023-06-02 NOTE — Assessment & Plan Note (Signed)
 Of several toes and fingers May need dermatology referral Will start with podiatrist

## 2023-06-02 NOTE — Progress Notes (Signed)
 Darin Hart 53 y.o.   Chief Complaint  Patient presents with   Numbness    Left great toe is numb for about 3 months. Pt states he is not sure if its due to him being in the freezer for long period of time or because he did barrow his work boot from someone else    Follow-up    F/u for his Testerone cream. Wants referral to ophthalmologist/ pt also c/o of his nail having some discoloration for awhile     HISTORY OF PRESENT ILLNESS: This is a 53 y.o. male complaining of numbness to left great toe for at least 3 months.  Unsure what is causing this.  At work, spends a lot of time in and out of freezer area Also here for follow-up of chronic medical conditions.  Needs blood work. Needs medication refill. Also has visual problems.  Requesting referral to eye doctor No other complaints or medical concerns today.  HPI   Prior to Admission medications   Medication Sig Start Date End Date Taking? Authorizing Provider  albuterol  (VENTOLIN  HFA) 108 (90 Base) MCG/ACT inhaler Inhale 2 puffs into the lungs every 6 (six) hours as needed for wheezing or shortness of breath. 01/13/22  Yes Donnivan Villena, Emil Schanz, MD  diclofenac  (VOLTAREN ) 75 MG EC tablet Take 1 tablet (75 mg total) by mouth 2 (two) times daily. 11/10/21  Yes Jule Ronal CROME, PA-C  ibuprofen  (ADVIL ) 800 MG tablet Take 1 tablet (800 mg total) by mouth every 8 (eight) hours as needed. 10/06/22  Yes Jerri Kay HERO, MD  methocarbamol  (ROBAXIN ) 500 MG tablet Take 500 mg by mouth 3 (three) times daily. 06/07/22  Yes [provider]  methocarbamol  (ROBAXIN -750) 750 MG tablet Take 1 tablet (750 mg total) by mouth 3 (three) times daily as needed for muscle spasms. 10/05/22  Yes Jule Ronal CROME, PA-C  Multiple Vitamin (MULTIVITAMIN WITH MINERALS) TABS tablet Take 1 tablet by mouth daily.   Yes [provider]  ondansetron  (ZOFRAN ) 4 MG tablet Take 1-2 tablets (4-8 mg total) by mouth every 8 (eight) hours as needed for nausea  or vomiting. 09/30/21  Yes Jerri Kay HERO, MD  Testosterone  20.25 MG/ACT (1.62%) GEL Place 1 Application onto the skin daily. 06/02/23   [place]cell Emil Schanz, MD    Allergies  Allergen Reactions   Shellfish Allergy Anaphylaxis   Other Other (See Comments)    Migraines, itchy tongue, burning mouth All Tree Nuts   Penicillins     Pt was told he could not take this, his reaction is unknown Has patient had a PCN reaction causing immediate rash, facial/tongue/throat swelling, SOB or lightheadedness with hypotension: Unknown Has patient had a PCN reaction causing severe rash involving mucus membranes or skin necrosis: Unknown Has patient had a PCN reaction that required hospitalization: NO Has patient had a PCN reaction occurring within the last 10 years: NO If all of the above answers are NO, then may proceed with Cephalospo    Patient Active Problem List   Diagnosis Date Noted   Numbness of toes 06/02/2023   Onychomycosis 06/02/2023   Nail problem 10/19/2022   Hypogonadism in male 10/19/2022   Lower urinary tract symptoms (LUTS) 10/19/2022   History of inguinal hernia repair 06/16/2022   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   History of asthma 10/02/2020   History of multiple allergies 10/02/2020   Low testosterone  02/07/2018    Past Medical History:  Diagnosis Date   Arthritis  knees   Asthma    Biceps rupture, proximal    right   Heart murmur    as child    Past Surgical History:  Procedure Laterality Date   BICEPT TENODESIS Right 09/30/2021   Procedure: RIGHT BICEPS TENODESIS;  Surgeon: Jerri Kay HERO, MD;  [facility];  Service: Orthopedics;  Laterality: Right;   HERNIA REPAIR     ORIF FINGER / THUMB FRACTURE Right    RHINOPLASTY     ROTATOR CUFF REPAIR Right    Spontaneous pneumothorax Left    20 yrs ago    Social History   Socioeconomic History   Marital status: Married    Spouse name: Not on file   Number of  children: Not on file   Years of education: Not on file   Highest education level: Not on file  Occupational History   Not on file  Tobacco Use   Smoking status: Never   Smokeless tobacco: Never  Vaping Use   Vaping status: Never Used  Substance and Sexual Activity   Alcohol use: Yes    Comment: OCCASIONALLY   Drug use: Never   Sexual activity: Yes    Partners: Female  Other Topics Concern   Not on file  Social History Narrative   Not on file   Social Drivers of Health   Financial Resource Strain: Low Risk  (12/05/2018)   Overall Financial Resource Strain (CARDIA)    Difficulty of Paying Living Expenses: Not hard at all  Food Insecurity: No Food Insecurity (12/05/2018)   Hunger Vital Sign    Worried About Running Out of Food in the Last Year: Never true    Ran Out of Food in the Last Year: Never true  Transportation Needs: No Transportation Needs (12/05/2018)   PRAPARE - Administrator, Civil Service (Medical): No    Lack of Transportation (Non-Medical): No  Physical Activity: Not on file  Stress: Stress Concern Present (12/05/2018)   [employer] of Occupational Health - Occupational Stress Questionnaire    Feeling of Stress : To some extent  Social Connections: Unknown (12/05/2018)   Social Connection and Isolation Panel [NHANES]    Frequency of Communication with Friends and Family: More than three times a week    Frequency of Social Gatherings with Friends and Family: More than three times a week    Attends Religious Services: Not on file    Active Member of Clubs or Organizations: Not on file    Attends Banker Meetings: Not on file    Marital Status: Married  Intimate Partner Violence: Not At Risk (12/05/2018)   Humiliation, Afraid, Rape, and Kick questionnaire    Fear of Current or Ex-Partner: No    Emotionally Abused: No    Physically Abused: No    Sexually Abused: No    Family History  Problem Relation Age of Onset   Hypertension  Mother    Other Neg Hx        hypogonadism     Review of Systems  Constitutional: Negative.  Negative for chills and fever.  HENT: Negative.  Negative for congestion and sore throat.   Respiratory: Negative.  Negative for cough and shortness of breath.   Cardiovascular: Negative.  Negative for chest pain and palpitations.  Gastrointestinal:  Negative for abdominal pain, diarrhea, nausea and vomiting.  Genitourinary: Negative.  Negative for dysuria and hematuria.  Skin:  Positive for rash.  Neurological: Negative.  Negative for dizziness  and headaches.  All other systems reviewed and are negative.   Vitals:   06/02/23 1350  BP: 120/88  Pulse: (!) 102  Temp: 97.8 F (36.6 C)  SpO2: 99%    Physical Exam Vitals reviewed.  Constitutional:      Appearance: Normal appearance.  HENT:     Head: Normocephalic.     Mouth/Throat:     Mouth: Mucous membranes are moist.     Pharynx: Oropharynx is clear.  Eyes:     Extraocular Movements: Extraocular movements intact.     Conjunctiva/sclera: Conjunctivae normal.     Pupils: Pupils are equal, round, and reactive to light.  Cardiovascular:     Rate and Rhythm: Normal rate and regular rhythm.     Pulses: Normal pulses.     Heart sounds: Normal heart sounds.  Pulmonary:     Effort: Pulmonary effort is normal.     Breath sounds: Normal breath sounds.  Abdominal:     Palpations: Abdomen is soft.     Tenderness: There is no abdominal tenderness.  Musculoskeletal:     Cervical back: No tenderness.     Comments: Feet: Warm to touch.  No erythema or ecchymosis.  Good peripheral pulses.  Positive onychomycosis  Lymphadenopathy:     Cervical: No cervical adenopathy.  Skin:    General: Skin is warm and dry.     Capillary Refill: Capillary refill takes less than 2 seconds.     Comments: Onychomycosis of big toenails and also both thumb nails  Neurological:     General: No focal deficit present.     Mental Status: He is alert and  oriented to person, place, and time.  Psychiatric:        Mood and Affect: Mood normal.        Behavior: Behavior normal.      ASSESSMENT & PLAN: A total of 43 minutes was spent with the patient and counseling/coordination of care regarding preparing for this visit, review of most recent office visit notes, review of multiple chronic medical conditions and their management, review of all medications, review of most recent bloodwork results, review of health maintenance items, education on nutrition, prognosis, documentation, and need for follow up.   Problem List Items Addressed This Visit       Endocrine   Hypogonadism in male   Using daily testosterone  gel Helping with symptoms Feeling much better We will repeat level today Medication refilled      Relevant Medications   Testosterone  20.25 MG/ACT (1.62%) GEL   Other Relevant Orders   CBC with Differential/Platelet   Comprehensive metabolic panel   Hemoglobin A1c   Lipid panel   Testosterone      Musculoskeletal and Integument   Onychomycosis   Of several toes and fingers May need dermatology referral Will start with podiatrist      Relevant Orders   Ambulatory referral to Podiatry   CBC with Differential/Platelet   Comprehensive metabolic panel   Hemoglobin A1c   Lipid panel   Testosterone      Other   History of asthma   Stable and well-controlled. No recent need for albuterol  rescue inhaler.        Relevant Orders   CBC with Differential/Platelet   Comprehensive metabolic panel   Hemoglobin A1c   Lipid panel   Testosterone    Nail problem   Unclear etiology Likely fungal infection May reflect systemic problem or vitamin deficiency. Blood work done today May need dermatology evaluation  Relevant Orders   CBC with Differential/Platelet   Comprehensive metabolic panel   Hemoglobin A1c   Lipid panel   Testosterone    Numbness of toes - Primary   Differential diagnosis discussed Unclear  etiology Recommend podiatry evaluation Referral placed today Onychomycosis on exam Recommend blood work today      Relevant Orders   Ambulatory referral to Podiatry   CBC with Differential/Platelet   Comprehensive metabolic panel   Hemoglobin A1c   Lipid panel   Testosterone    Other Visit Diagnoses       Need for vaccination       Relevant Orders   Flu vaccine trivalent PF, 6mos and older(Flulaval,Afluria,Fluarix,Fluzone) (Completed)     Visual complaint       Relevant Orders   Ambulatory referral to Ophthalmology   CBC with Differential/Platelet   Comprehensive metabolic panel   Hemoglobin A1c   Lipid panel   Testosterone       Patient Instructions  Health Maintenance, Male Adopting a healthy lifestyle and getting preventive care are important in promoting health and wellness. Ask your health care provider about: The right schedule for you to have regular tests and exams. Things you can do on your own to prevent diseases and keep yourself healthy. What should I know about diet, weight, and exercise? Eat a healthy diet  Eat a diet that includes plenty of vegetables, fruits, low-fat dairy products, and lean protein. Do not eat a lot of foods that are high in solid fats, added sugars, or sodium. Maintain a healthy weight Body mass index (BMI) is a measurement that can be used to identify possible weight problems. It estimates body fat based on height and weight. Your health care provider can help determine your BMI and help you achieve or maintain a healthy weight. Get regular exercise Get regular exercise. This is one of the most important things you can do for your health. Most adults should: Exercise for at least 150 minutes each week. The exercise should increase your heart rate and make you sweat (moderate-intensity exercise). Do strengthening exercises at least twice a week. This is in addition to the moderate-intensity exercise. Spend less time sitting. Even light  physical activity can be beneficial. Watch cholesterol and blood lipids Have your blood tested for lipids and cholesterol at 53 years of age, then have this test every 5 years. You may need to have your cholesterol levels checked more often if: Your lipid or cholesterol levels are high. You are older than 53 years of age. You are at high risk for heart disease. What should I know about cancer screening? Many types of cancers can be detected early and may often be prevented. Depending on your health history and family history, you may need to have cancer screening at various ages. This may include screening for: Colorectal cancer. Prostate cancer. Skin cancer. Lung cancer. What should I know about heart disease, diabetes, and high blood pressure? Blood pressure and heart disease High blood pressure causes heart disease and increases the risk of stroke. This is more likely to develop in people who have high blood pressure readings or are overweight. Talk with your health care provider about your target blood pressure readings. Have your blood pressure checked: Every 3-5 years if you are 27-1 years of age. Every year if you are 10 years old or older. If you are between the ages of 62 and 4 and are a current or former smoker, ask your health care provider if you should have  a one-time screening for abdominal aortic aneurysm (AAA). Diabetes Have regular diabetes screenings. This checks your fasting blood sugar level. Have the screening done: Once every three years after age 76 if you are at a normal weight and have a low risk for diabetes. More often and at a younger age if you are overweight or have a high risk for diabetes. What should I know about preventing infection? Hepatitis B If you have a higher risk for hepatitis B, you should be screened for this virus. Talk with your health care provider to find out if you are at risk for hepatitis B infection. Hepatitis C Blood testing is  recommended for: Everyone born from [birth year] through 1965. Anyone with known risk factors for hepatitis C. Sexually transmitted infections (STIs) You should be screened each year for STIs, including gonorrhea and chlamydia, if: You are sexually active and are younger than 53 years of age. You are older than 53 years of age and your health care provider tells you that you are at risk for this type of infection. Your sexual activity has changed since you were last screened, and you are at increased risk for chlamydia or gonorrhea. Ask your health care provider if you are at risk. Ask your health care provider about whether you are at high risk for HIV. Your health care provider may recommend a prescription medicine to help prevent HIV infection. If you choose to take medicine to prevent HIV, you should first get tested for HIV. You should then be tested every 3 months for as long as you are taking the medicine. Follow these instructions at home: Alcohol use Do not drink alcohol if your health care provider tells you not to drink. If you drink alcohol: Limit how much you have to 0-2 drinks a day. Know how much alcohol is in your drink. In the [place] one drink equals one 12 oz bottle of beer (355 mL), one 5 oz glass of wine (148 mL), or one 1 oz glass of hard liquor (44 mL). Lifestyle Do not use any products that contain nicotine or tobacco. These products include cigarettes, chewing tobacco, and vaping devices, such as e-cigarettes. If you need help quitting, ask your health care provider. Do not use street drugs. Do not share needles. Ask your health care provider for help if you need support or information about quitting drugs. General instructions Schedule regular health, dental, and eye exams. Stay current with your vaccines. Tell your health care provider if: You often feel depressed. You have ever been abused or do not feel safe at home. Summary Adopting a healthy lifestyle and getting  preventive care are important in promoting health and wellness. Follow your health care provider's instructions about healthy diet, exercising, and getting tested or screened for diseases. Follow your health care provider's instructions on monitoring your cholesterol and blood pressure. This information is not intended to replace advice given to you by your health care provider. Make sure you discuss any questions you have with your health care provider. Document Revised: 09/29/2020 Document Reviewed: 09/29/2020 Elsevier Patient Education  2024 Elsevier Inc.    Emil Schaumann, MD [facility]

## 2023-06-02 NOTE — Assessment & Plan Note (Signed)
 Using daily testosterone gel Helping with symptoms Feeling much better We will repeat level today Medication refilled

## 2023-06-02 NOTE — Patient Instructions (Signed)
 Health Maintenance, Male  Adopting a healthy lifestyle and getting preventive care are important in promoting health and wellness. Ask your health care provider about:  The right schedule for you to have regular tests and exams.  Things you can do on your own to prevent diseases and keep yourself healthy.  What should I know about diet, weight, and exercise?  Eat a healthy diet    Eat a diet that includes plenty of vegetables, fruits, low-fat dairy products, and lean protein.  Do not eat a lot of foods that are high in solid fats, added sugars, or sodium.  Maintain a healthy weight  Body mass index (BMI) is a measurement that can be used to identify possible weight problems. It estimates body fat based on height and weight. Your health care provider can help determine your BMI and help you achieve or maintain a healthy weight.  Get regular exercise  Get regular exercise. This is one of the most important things you can do for your health. Most adults should:  Exercise for at least 150 minutes each week. The exercise should increase your heart rate and make you sweat (moderate-intensity exercise).  Do strengthening exercises at least twice a week. This is in addition to the moderate-intensity exercise.  Spend less time sitting. Even light physical activity can be beneficial.  Watch cholesterol and blood lipids  Have your blood tested for lipids and cholesterol at 53 years of age, then have this test every 5 years.  You may need to have your cholesterol levels checked more often if:  Your lipid or cholesterol levels are high.  You are older than 53 years of age.  You are at high risk for heart disease.  What should I know about cancer screening?  Many types of cancers can be detected early and may often be prevented. Depending on your health history and family history, you may need to have cancer screening at various ages. This may include screening for:  Colorectal cancer.  Prostate cancer.  Skin cancer.  Lung  cancer.  What should I know about heart disease, diabetes, and high blood pressure?  Blood pressure and heart disease  High blood pressure causes heart disease and increases the risk of stroke. This is more likely to develop in people who have high blood pressure readings or are overweight.  Talk with your health care provider about your target blood pressure readings.  Have your blood pressure checked:  Every 3-5 years if you are 24-52 years of age.  Every year if you are 3 years old or older.  If you are between the ages of 60 and 72 and are a current or former smoker, ask your health care provider if you should have a one-time screening for abdominal aortic aneurysm (AAA).  Diabetes  Have regular diabetes screenings. This checks your fasting blood sugar level. Have the screening done:  Once every three years after age 66 if you are at a normal weight and have a low risk for diabetes.  More often and at a younger age if you are overweight or have a high risk for diabetes.  What should I know about preventing infection?  Hepatitis B  If you have a higher risk for hepatitis B, you should be screened for this virus. Talk with your health care provider to find out if you are at risk for hepatitis B infection.  Hepatitis C  Blood testing is recommended for:  Everyone born from [birth year] through 1965.  Anyone  with known risk factors for hepatitis C.  Sexually transmitted infections (STIs)  You should be screened each year for STIs, including gonorrhea and chlamydia, if:  You are sexually active and are younger than 53 years of age.  You are older than 53 years of age and your health care provider tells you that you are at risk for this type of infection.  Your sexual activity has changed since you were last screened, and you are at increased risk for chlamydia or gonorrhea. Ask your health care provider if you are at risk.  Ask your health care provider about whether you are at high risk for HIV. Your health care provider  may recommend a prescription medicine to help prevent HIV infection. If you choose to take medicine to prevent HIV, you should first get tested for HIV. You should then be tested every 3 months for as long as you are taking the medicine.  Follow these instructions at home:  Alcohol use  Do not drink alcohol if your health care provider tells you not to drink.  If you drink alcohol:  Limit how much you have to 0-2 drinks a day.  Know how much alcohol is in your drink. In the [place] one drink equals one 12 oz bottle of beer (355 mL), one 5 oz glass of wine (148 mL), or one 1 oz glass of hard liquor (44 mL).  Lifestyle  Do not use any products that contain nicotine or tobacco. These products include cigarettes, chewing tobacco, and vaping devices, such as e-cigarettes. If you need help quitting, ask your health care provider.  Do not use street drugs.  Do not share needles.  Ask your health care provider for help if you need support or information about quitting drugs.  General instructions  Schedule regular health, dental, and eye exams.  Stay current with your vaccines.  Tell your health care provider if:  You often feel depressed.  You have ever been abused or do not feel safe at home.  Summary  Adopting a healthy lifestyle and getting preventive care are important in promoting health and wellness.  Follow your health care provider's instructions about healthy diet, exercising, and getting tested or screened for diseases.  Follow your health care provider's instructions on monitoring your cholesterol and blood pressure.  This information is not intended to replace advice given to you by your health care provider. Make sure you discuss any questions you have with your health care provider.  Document Revised: 09/29/2020 Document Reviewed: 09/29/2020  Elsevier Patient Education  2024 [employer].

## 2023-06-02 NOTE — Assessment & Plan Note (Signed)
 Differential diagnosis discussed Unclear etiology Recommend podiatry evaluation Referral placed today Onychomycosis on exam Recommend blood work today

## 2023-06-02 NOTE — Assessment & Plan Note (Signed)
Stable and well-controlled. No recent need for albuterol rescue inhaler.

## 2023-06-03 ENCOUNTER — Other Ambulatory Visit: Payer: Self-pay | Admitting: Urology

## 2023-06-03 DIAGNOSIS — N402 Nodular prostate without lower urinary tract symptoms: Secondary | ICD-10-CM

## 2023-06-06 ENCOUNTER — Telehealth: Payer: Self-pay

## 2023-06-06 NOTE — Telephone Encounter (Signed)
 Pt scheduled for MRI of Prostate W WO. Scheduler reached out stating pt says he is allergic to all dyes. This RN spoke with pt, no allergies in chart listed for any dyes. Pt states he had a seizure one time when he had to drink some kind of dye. Pt unable to tell me what he drank or when this occurred. I explained to pt he would not be drinking any contrast/dye for his upcoming MRI, therefore, no 13 prep necessary. Pt agreeable.

## 2023-06-08 ENCOUNTER — Encounter: Payer: Self-pay | Admitting: Urology

## 2023-06-23 ENCOUNTER — Ambulatory Visit: Payer: 59 | Admitting: Podiatry

## 2023-07-01 ENCOUNTER — Ambulatory Visit: Payer: 59 | Admitting: Podiatry

## 2023-07-07 ENCOUNTER — Ambulatory Visit: Payer: 59 | Admitting: Podiatry

## 2023-07-14 ENCOUNTER — Ambulatory Visit: Payer: 59 | Admitting: Podiatry

## 2023-07-15 ENCOUNTER — Other Ambulatory Visit: Payer: 59

## 2023-07-27 ENCOUNTER — Encounter: Payer: Self-pay | Admitting: Emergency Medicine

## 2023-07-28 ENCOUNTER — Ambulatory Visit: Payer: 59 | Admitting: Podiatry

## 2023-08-04 ENCOUNTER — Encounter: Payer: Self-pay | Admitting: Podiatry

## 2023-08-04 ENCOUNTER — Ambulatory Visit (INDEPENDENT_AMBULATORY_CARE_PROVIDER_SITE_OTHER): Admitting: Podiatry

## 2023-08-04 DIAGNOSIS — M79675 Pain in left toe(s): Secondary | ICD-10-CM | POA: Diagnosis not present

## 2023-08-04 DIAGNOSIS — B351 Tinea unguium: Secondary | ICD-10-CM

## 2023-08-04 DIAGNOSIS — M79674 Pain in right toe(s): Secondary | ICD-10-CM

## 2023-08-04 NOTE — Progress Notes (Signed)
 Chief Complaint  Patient presents with   Nail Problem    Pt has been experience darkening of toenails bilaterally since the summer was referred by PCP who told him it could be something internally. Has not tried any medications. Denies pain.    HPI: 53 y.o. male presents today with concern for worsening appearance of toenails bilaterally.  First 2 toenails of each foot are most affected.  Noticed that they have gotten thicker and darker since the summer.  He is very concerned about infectious process.  Denies diabetes  Past Medical History:  Diagnosis Date   Arthritis    knees   Asthma    Biceps rupture, proximal    right   Heart murmur    as child    Past Surgical History:  Procedure Laterality Date   BICEPT TENODESIS Right 09/30/2021   Procedure: RIGHT BICEPS TENODESIS;  Surgeon: Tarry Kos, MD;  Location: [facility];  Service: Orthopedics;  Laterality: Right;   HERNIA REPAIR     ORIF FINGER / THUMB FRACTURE Right    RHINOPLASTY     ROTATOR CUFF REPAIR Right    Spontaneous pneumothorax Left    20 yrs ago    Allergies  Allergen Reactions   Shellfish Allergy Anaphylaxis   Other Other (See Comments)    Migraines, itchy tongue, burning mouth All Tree Nuts   Penicillins     Pt was told he could not take this, his reaction is unknown Has patient had a PCN reaction causing immediate rash, facial/tongue/throat swelling, SOB or lightheadedness with hypotension: Unknown Has patient had a PCN reaction causing severe rash involving mucus membranes or skin necrosis: Unknown Has patient had a PCN reaction that required hospitalization: NO Has patient had a PCN reaction occurring within the last 10 years: NO If all of the above answers are "NO", then may proceed with Cephalospo    ROS    Physical Exam: There were no vitals filed for this visit.  General: The patient is alert and oriented x3 in no acute distress.  Dermatology: Skin is warm, dry and  supple bilateral lower extremities. Interspaces are clear of maceration and debris.  Toenails are thickened, discolored with mild subungual debris.  Bilateral first 2 toenails most affected by there are some changes to all the nails.  They do become tender as they get thickened  Vascular: Palpable pedal pulses bilaterally. Capillary refill within normal limits.  No appreciable edema.  No erythema or calor.  Neurological: Light touch sensation grossly intact bilateral feet.   Musculoskeletal Exam: No pedal deformities noted  Assessment/Plan of Care: 1. Pain due to onychomycosis of toenails of both feet      No orders of the defined types were placed in this encounter.  None  Discussed clinical findings with patient today.  # Onychomycosis of toenails of both feet -Toenails x 10 debrided in thickness and length using sterile nail nippers without incident -Nail specimen sent off for fungal culture -Did discuss that the nail changes could be due to repetitive trauma over time -Did discuss treatment options, patient interested in oral medication if cultures are positive  Follow-up in 3 weeks for nail pathology   Perfecto Purdy L. Marchia Bond, AACFAS [facility]     [address]  [place], [place] [address]                Office [phone]  Fax [phone]

## 2023-08-04 NOTE — Patient Instructions (Signed)
 You can combine 3 tablespoons of coconut oil and 10-15 drops of tea tree oil together and apply to the toenails once a day.

## 2023-08-15 ENCOUNTER — Other Ambulatory Visit: Payer: Self-pay | Admitting: Podiatry

## 2023-08-19 ENCOUNTER — Ambulatory Visit
Admission: RE | Admit: 2023-08-19 | Discharge: 2023-08-19 | Disposition: A | Discharge status: Home / Self Care | Payer: 59 | Source: Ambulatory Visit | Attending: Urology | Admitting: Urology

## 2023-08-19 DIAGNOSIS — N402 Nodular prostate without lower urinary tract symptoms: Secondary | ICD-10-CM

## 2023-08-19 MED ORDER — GADOPICLENOL 0.5 MMOL/ML IV SOLN
8.0000 mL | Freq: Once | INTRAVENOUS | Status: AC | PRN
  Administered 2023-08-19: 8 mL via INTRAVENOUS

## 2023-08-25 ENCOUNTER — Ambulatory Visit (INDEPENDENT_AMBULATORY_CARE_PROVIDER_SITE_OTHER): Admitting: Podiatry

## 2023-08-25 ENCOUNTER — Encounter: Payer: Self-pay | Admitting: Podiatry

## 2023-08-25 VITALS — Ht 72.0 in | Wt 177.0 lb

## 2023-08-25 DIAGNOSIS — M79675 Pain in left toe(s): Secondary | ICD-10-CM

## 2023-08-25 DIAGNOSIS — M79674 Pain in right toe(s): Secondary | ICD-10-CM

## 2023-08-25 DIAGNOSIS — B351 Tinea unguium: Secondary | ICD-10-CM | POA: Diagnosis not present

## 2023-08-25 MED ORDER — TERBINAFINE HCL 250 MG PO TABS: 250.0000 mg | ORAL_TABLET | Freq: Every day | ORAL | 1 refills | Status: DC

## 2023-08-25 NOTE — Progress Notes (Signed)
 Chief Complaint  Patient presents with   Nail Problem    " I am here to go over my biopsy results today"    HPI: 53 y.o. male presents today to review nail pathology.  He is interested in pharmacological treatment of the fungal nails.  Past Medical History:  Diagnosis Date   Arthritis    knees   Asthma    Biceps rupture, proximal    right   Heart murmur    as child    Past Surgical History:  Procedure Laterality Date   BICEPT TENODESIS Right 09/30/2021   Procedure: RIGHT BICEPS TENODESIS;  Surgeon: Tarry Kos, MD;  Location: [facility];  Service: Orthopedics;  Laterality: Right;   HERNIA REPAIR     ORIF FINGER / THUMB FRACTURE Right    RHINOPLASTY     ROTATOR CUFF REPAIR Right    Spontaneous pneumothorax Left    20 yrs ago    Allergies  Allergen Reactions   Shellfish Allergy Anaphylaxis   Other Other (See Comments)    Migraines, itchy tongue, burning mouth All Tree Nuts   Penicillins     Pt was told he could not take this, his reaction is unknown Has patient had a PCN reaction causing immediate rash, facial/tongue/throat swelling, SOB or lightheadedness with hypotension: Unknown Has patient had a PCN reaction causing severe rash involving mucus membranes or skin necrosis: Unknown Has patient had a PCN reaction that required hospitalization: NO Has patient had a PCN reaction occurring within the last 10 years: NO If all of the above answers are "NO", then may proceed with Cephalospo    ROS    Physical Exam: There were no vitals filed for this visit.  General: The patient is alert and oriented x3 in no acute distress.  Dermatology: Skin is warm, dry and supple bilateral lower extremities. Interspaces are clear of maceration and debris.  Toenails are thickened, discolored with mild subungual debris.  Bilateral first 2 toenails most affected by there are some changes to all the nails.  They do become tender as they get  thickened  Vascular: Palpable pedal pulses bilaterally. Capillary refill within normal limits.  No appreciable edema.  No erythema or calor.  Neurological: Light touch sensation grossly intact bilateral feet.   Musculoskeletal Exam: No pedal deformities noted  Assessment/Plan of Care: 1. Pain due to onychomycosis of toenails of both feet      Meds ordered this encounter  Medications   terbinafine (LAMISIL) 250 MG tablet    Sig: Take 1 tablet (250 mg total) by mouth daily.    Dispense:  90 tablet    Refill:  1   None  Discussed clinical findings with patient today.  # Onychomycosis of toenails of both feet with pain. -Pathology was positive for onychomycosis -Reviewed prior labs from January showing no elevation of liver enzymes, normal CBC. -Initiating oral terbinafine once daily 250 mg.  Discussed with patient that this will likely require prolonged course of therapy.  Follow-up in 2 months for nail trim, repeat labs.   Kym Scannell L. Marchia Bond, AACFAS [facility]     [address], [place] [address]  Office [phone]  Fax [phone]

## 2023-09-15 ENCOUNTER — Ambulatory Visit: Admitting: Emergency Medicine

## 2023-09-15 ENCOUNTER — Encounter: Payer: Self-pay | Admitting: Emergency Medicine

## 2023-09-15 VITALS — BP 112/72 | HR 94 | Temp 98.1°F | Ht 72.0 in | Wt 170.0 lb

## 2023-09-15 DIAGNOSIS — E291 Testicular hypofunction: Secondary | ICD-10-CM

## 2023-09-15 DIAGNOSIS — Z8709 Personal history of other diseases of the respiratory system: Secondary | ICD-10-CM | POA: Diagnosis not present

## 2023-09-15 DIAGNOSIS — M25512 Pain in left shoulder: Secondary | ICD-10-CM | POA: Diagnosis not present

## 2023-09-15 DIAGNOSIS — R399 Unspecified symptoms and signs involving the genitourinary system: Secondary | ICD-10-CM

## 2023-09-15 DIAGNOSIS — S6391XA Sprain of unspecified part of right wrist and hand, initial encounter: Secondary | ICD-10-CM

## 2023-09-15 LAB — COMPREHENSIVE METABOLIC PANEL WITH GFR
ALT: 23 U/L (ref 0–53)
AST: 20 U/L (ref 0–37)
Albumin: 4.5 g/dL (ref 3.5–5.2)
Alkaline Phosphatase: 65 U/L (ref 39–117)
BUN: 20 mg/dL (ref 6–23)
CO2: 31 meq/L (ref 19–32)
Calcium: 9.6 mg/dL (ref 8.4–10.5)
Chloride: 102 meq/L (ref 96–112)
Creatinine, Ser: 1.23 mg/dL (ref 0.40–1.50)
GFR: 67.28 mL/min (ref >60.00)
Glucose, Bld: 71 mg/dL (ref 70–99)
Potassium: 4.2 meq/L (ref 3.5–5.1)
Sodium: 140 meq/L (ref 135–145)
Total Bilirubin: 0.6 mg/dL (ref 0.2–1.2)
Total Protein: 6.8 g/dL (ref 6.0–8.3)

## 2023-09-15 LAB — PSA: PSA: 2.64 ng/mL (ref 0.10–4.00)

## 2023-09-15 LAB — TESTOSTERONE: Testosterone: 434.59 ng/dL (ref 300.00–890.00)

## 2023-09-15 MED ORDER — IBUPROFEN 800 MG PO TABS: 800.0000 mg | ORAL_TABLET | Freq: Three times a day (TID) | ORAL | 0 refills | Status: DC | PRN

## 2023-09-15 MED ORDER — ALBUTEROL SULFATE HFA 108 (90 BASE) MCG/ACT IN AERS: 2.0000 | INHALATION_SPRAY | Freq: Four times a day (QID) | RESPIRATORY_TRACT | 3 refills | Status: AC | PRN

## 2023-09-15 NOTE — Progress Notes (Signed)
 Darin Hart 53 y.o.   Chief Complaint  Patient presents with   Immunizations    Patient wanting to see what vaccines are due. He is up to date based on immunizations on file. Patient is wanting his TRT checked.    Hand Pain    Patient is having right hand pain, he states he was pulling a latch a month ago and felt a "pop" in his ring finger and is now tender to the touch. Mentions left shoulder aching pain when he raises that arm up he feels the pain     HISTORY OF PRESENT ILLNESS: This is a 53 y.o. male here for follow-up of chronic medical conditions Also injured right hand while at work about a month ago Also complaining of pain to left shoulder for several weeks No other complaints or medical concerns today.  Hand Pain  Pertinent negatives include no chest pain.     Prior to Admission medications   Medication Sig Start Date End Date Taking? Authorizing Provider  alfuzosin (UROXATRAL) 10 MG 24 hr tablet Take 10 mg by mouth at bedtime. 08/18/23  Yes [provider]  diclofenac  (VOLTAREN ) 75 MG EC tablet Take 1 tablet (75 mg total) by mouth 2 (two) times daily. 11/10/21  Yes Sandie Cross, PA-C  methocarbamol  (ROBAXIN ) 500 MG tablet Take 500 mg by mouth 3 (three) times daily. 06/07/22  Yes [provider]  methocarbamol  (ROBAXIN -750) 750 MG tablet Take 1 tablet (750 mg total) by mouth 3 (three) times daily as needed for muscle spasms. 10/05/22  Yes Sandie Cross, PA-C  Multiple Vitamin (MULTIVITAMIN WITH MINERALS) TABS tablet Take 1 tablet by mouth daily.   Yes [provider]  ondansetron  (ZOFRAN ) 4 MG tablet Take 1-2 tablets (4-8 mg total) by mouth every 8 (eight) hours as needed for nausea or vomiting. 09/30/21  Yes Wes Hamman, MD  terbinafine  (LAMISIL ) 250 MG tablet Take 1 tablet (250 mg total) by mouth daily. 08/25/23  Yes Semon, Jake L, DPM  Testosterone  20.25 MG/ACT (1.62%) GEL Place 1 Application onto the skin daily. 06/02/23  Yes Phylliss Strege,  Isidro Margo, MD  albuterol  (VENTOLIN  HFA) 108 [phone] Base) MCG/ACT inhaler Inhale 2 puffs into the lungs every 6 (six) hours as needed for wheezing or shortness of breath. 09/15/23   Elvira Hammersmith, MD  ibuprofen  (ADVIL ) 800 MG tablet Take 1 tablet (800 mg total) by mouth every 8 (eight) hours as needed. 09/15/23   Elvira Hammersmith, MD    Allergies  Allergen Reactions   Shellfish Allergy Anaphylaxis   Other Other (See Comments)    Migraines, itchy tongue, burning mouth All Tree Nuts   Penicillins     Pt was told he could not take this, his reaction is unknown Has patient had a PCN reaction causing immediate rash, facial/tongue/throat swelling, SOB or lightheadedness with hypotension: Unknown Has patient had a PCN reaction causing severe rash involving mucus membranes or skin necrosis: Unknown Has patient had a PCN reaction that required hospitalization: NO Has patient had a PCN reaction occurring within the last 10 years: NO If all of the above answers are "NO", then may proceed with Cephalospo    Patient Active Problem List   Diagnosis Date Noted   Numbness of toes 06/02/2023   Onychomycosis 06/02/2023   Nail problem 10/19/2022   Hypogonadism in male 10/19/2022   Lower urinary tract symptoms (LUTS) 10/19/2022   History of inguinal hernia repair 06/16/2022   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   History of asthma 10/02/2020   History of multiple allergies 10/02/2020   Low testosterone  02/07/2018    Past Medical History:  Diagnosis Date   Arthritis    knees   Asthma    Biceps rupture, proximal    right   Heart murmur    as child    Past Surgical History:  Procedure Laterality Date   BICEPT TENODESIS Right 09/30/2021   Procedure: RIGHT BICEPS TENODESIS;  Surgeon: Wes Hamman, MD;  Location: [facility];  Service: Orthopedics;  Laterality: Right;   HERNIA REPAIR     ORIF FINGER / THUMB FRACTURE Right    RHINOPLASTY      ROTATOR CUFF REPAIR Right    Spontaneous pneumothorax Left    20 yrs ago    Social History   Socioeconomic History   Marital status: Married    Spouse name: Not on file   Number of children: Not on file   Years of education: Not on file   Highest education level: Not on file  Occupational History   Not on file  Tobacco Use   Smoking status: Never   Smokeless tobacco: Never  Vaping Use   Vaping status: Never Used  Substance and Sexual Activity   Alcohol use: Yes    Comment: OCCASIONALLY   Drug use: Never   Sexual activity: Yes    Partners: Female  Other Topics Concern   Not on file  Social History Narrative   Not on file   Social Drivers of Health   Financial Resource Strain: Low Risk  (12/05/2018)   Overall Financial Resource Strain (CARDIA)    Difficulty of Paying Living Expenses: Not hard at all  Food Insecurity: No Food Insecurity (12/05/2018)   Hunger Vital Sign    Worried About Running Out of Food in the Last Year: Never true    Ran Out of Food in the Last Year: Never true  Transportation Needs: No Transportation Needs (12/05/2018)   PRAPARE - Administrator, Civil Service (Medical): No    Lack of Transportation (Non-Medical): No  Physical Activity: Not on file  Stress: Stress Concern Present (12/05/2018)   [employer] of Occupational Health - Occupational Stress Questionnaire    Feeling of Stress : To some extent  Social Connections: Unknown (12/05/2018)   Social Connection and Isolation Panel [NHANES]    Frequency of Communication with Friends and Family: More than three times a week    Frequency of Social Gatherings with Friends and Family: More than three times a week    Attends Religious Services: Not on file    Active Member of Clubs or Organizations: Not on file    Attends Banker Meetings: Not on file    Marital Status: Married  Intimate Partner Violence: Not At Risk (12/05/2018)   Humiliation, Afraid, Rape, and Kick  questionnaire    Fear of Current or Ex-Partner: No    Emotionally Abused: No    Physically Abused: No    Sexually Abused: No    Family History  Problem Relation Age of Onset   Hypertension Mother    Other Neg Hx        hypogonadism     Review of Systems  Constitutional: Negative.  Negative for chills and fever.  HENT: Negative.  Negative for congestion and sore throat.   Respiratory: Negative.  Negative for cough and shortness of breath.   Cardiovascular: Negative.  Negative for  chest pain and palpitations.  Gastrointestinal:  Negative for abdominal pain, nausea and vomiting.  Genitourinary: Negative.  Negative for dysuria and hematuria.  Skin: Negative.  Negative for rash.  Neurological: Negative.  Negative for dizziness and headaches.  All other systems reviewed and are negative.   Vitals:   09/15/23 1331  BP: 112/72  Pulse: 94  Temp: 98.1 F (36.7 C)  SpO2: 96%    Physical Exam Vitals reviewed.  Constitutional:      Appearance: Normal appearance.  HENT:     Head: Normocephalic.     Mouth/Throat:     Mouth: Mucous membranes are moist.     Pharynx: Oropharynx is clear.  Eyes:     Extraocular Movements: Extraocular movements intact.     Conjunctiva/sclera: Conjunctivae normal.     Pupils: Pupils are equal, round, and reactive to light.  Cardiovascular:     Rate and Rhythm: Normal rate and regular rhythm.     Pulses: Normal pulses.     Heart sounds: Normal heart sounds.  Pulmonary:     Effort: Pulmonary effort is normal.     Breath sounds: Normal breath sounds.  Abdominal:     Palpations: Abdomen is soft.     Tenderness: There is no abdominal tenderness.  Musculoskeletal:     Cervical back: No tenderness.     Right lower leg: No edema.     Left lower leg: No edema.     Comments: Left shoulder: Full range of motion.  Minimal tenderness to palpation Right hand: Mild tenderness along flexor tendon of the ring finger.  Full range of motion of all fingers.   Able to make fist.  Good grip.  Lymphadenopathy:     Cervical: No cervical adenopathy.  Skin:    General: Skin is warm and dry.     Capillary Refill: Capillary refill takes less than 2 seconds.  Neurological:     General: No focal deficit present.     Mental Status: He is alert and oriented to person, place, and time.  Psychiatric:        Mood and Affect: Mood normal.        Behavior: Behavior normal.      ASSESSMENT & PLAN: A total of 41 minutes was spent with the patient and counseling/coordination of care regarding preparing for this visit, review of most recent office visit notes, review of multiple chronic medical conditions and their management, review of all medications, review of most recent bloodwork results, review of health maintenance items, education on nutrition, prognosis, documentation, and need for follow up.   Problem List Items Addressed This Visit       Endocrine   Hypogonadism in male - Primary   Using daily testosterone  gel Helping with symptoms Feeling much better We will repeat level today Medication refilled      Relevant Orders   Testosterone    PSA   Comprehensive metabolic panel with GFR     Musculoskeletal and Integument   Hand sprain, right, initial encounter   Benign physical examination Work-related injury Needs orthopedic evaluation Referral placed today      Relevant Orders   Ambulatory referral to Orthopedic Surgery     Other   History of asthma   Stable and well-controlled. No recent need for albuterol  rescue inhaler      Relevant Medications   albuterol  (VENTOLIN  HFA) 108 (90 Base) MCG/ACT inhaler   Lower urinary tract symptoms (LUTS)   PSA done today Recommend urology evaluation Referral placed  today      Acute pain of left shoulder   Benign physical examination. Most likely related to activities of daily life and perhaps overuse at the gym Pain management discussed Recommend orthopedic evaluation Referral placed  today      Relevant Orders   Ambulatory referral to Orthopedic Surgery   Patient Instructions  Health Maintenance, Male Adopting a healthy lifestyle and getting preventive care are important in promoting health and wellness. Ask your health care provider about: The right schedule for you to have regular tests and exams. Things you can do on your own to prevent diseases and keep yourself healthy. What should I know about diet, weight, and exercise? Eat a healthy diet  Eat a diet that includes plenty of vegetables, fruits, low-fat dairy products, and lean protein. Do not eat a lot of foods that are high in solid fats, added sugars, or sodium. Maintain a healthy weight Body mass index (BMI) is a measurement that can be used to identify possible weight problems. It estimates body fat based on height and weight. Your health care provider can help determine your BMI and help you achieve or maintain a healthy weight. Get regular exercise Get regular exercise. This is one of the most important things you can do for your health. Most adults should: Exercise for at least 150 minutes each week. The exercise should increase your heart rate and make you sweat (moderate-intensity exercise). Do strengthening exercises at least twice a week. This is in addition to the moderate-intensity exercise. Spend less time sitting. Even light physical activity can be beneficial. Watch cholesterol and blood lipids Have your blood tested for lipids and cholesterol at 53 years of age, then have this test every 5 years. You may need to have your cholesterol levels checked more often if: Your lipid or cholesterol levels are high. You are older than 53 years of age. You are at high risk for heart disease. What should I know about cancer screening? Many types of cancers can be detected early and may often be prevented. Depending on your health history and family history, you may need to have cancer screening at various  ages. This may include screening for: Colorectal cancer. Prostate cancer. Skin cancer. Lung cancer. What should I know about heart disease, diabetes, and high blood pressure? Blood pressure and heart disease High blood pressure causes heart disease and increases the risk of stroke. This is more likely to develop in people who have high blood pressure readings or are overweight. Talk with your health care provider about your target blood pressure readings. Have your blood pressure checked: Every 3-5 years if you are 65-28 years of age. Every year if you are 77 years old or older. If you are between the ages of 85 and 31 and are a current or former smoker, ask your health care provider if you should have a one-time screening for abdominal aortic aneurysm (AAA). Diabetes Have regular diabetes screenings. This checks your fasting blood sugar level. Have the screening done: Once every three years after age 48 if you are at a normal weight and have a low risk for diabetes. More often and at a younger age if you are overweight or have a high risk for diabetes. What should I know about preventing infection? Hepatitis B If you have a higher risk for hepatitis B, you should be screened for this virus. Talk with your health care provider to find out if you are at risk for hepatitis B infection. Hepatitis  C Blood testing is recommended for: Everyone born from [birth year] through 1965. Anyone with known risk factors for hepatitis C. Sexually transmitted infections (STIs) You should be screened each year for STIs, including gonorrhea and chlamydia, if: You are sexually active and are younger than 53 years of age. You are older than 53 years of age and your health care provider tells you that you are at risk for this type of infection. Your sexual activity has changed since you were last screened, and you are at increased risk for chlamydia or gonorrhea. Ask your health care provider if you are at risk. Ask  your health care provider about whether you are at high risk for HIV. Your health care provider may recommend a prescription medicine to help prevent HIV infection. If you choose to take medicine to prevent HIV, you should first get tested for HIV. You should then be tested every 3 months for as long as you are taking the medicine. Follow these instructions at home: Alcohol use Do not drink alcohol if your health care provider tells you not to drink. If you drink alcohol: Limit how much you have to 0-2 drinks a day. Know how much alcohol is in your drink. In the [place] one drink equals one 12 oz bottle of beer (355 mL), one 5 oz glass of wine (148 mL), or one 1 oz glass of hard liquor (44 mL). Lifestyle Do not use any products that contain nicotine or tobacco. These products include cigarettes, chewing tobacco, and vaping devices, such as e-cigarettes. If you need help quitting, ask your health care provider. Do not use street drugs. Do not share needles. Ask your health care provider for help if you need support or information about quitting drugs. General instructions Schedule regular health, dental, and eye exams. Stay current with your vaccines. Tell your health care provider if: You often feel depressed. You have ever been abused or do not feel safe at home. Summary Adopting a healthy lifestyle and getting preventive care are important in promoting health and wellness. Follow your health care provider's instructions about healthy diet, exercising, and getting tested or screened for diseases. Follow your health care provider's instructions on monitoring your cholesterol and blood pressure. This information is not intended to replace advice given to you by your health care provider. Make sure you discuss any questions you have with your health care provider. Document Revised: 09/29/2020 Document Reviewed: 09/29/2020 Elsevier Patient Education  2024 Elsevier Inc.     Maryagnes Small,  MD [facility]

## 2023-09-15 NOTE — Assessment & Plan Note (Signed)
 Benign physical examination Work-related injury Needs orthopedic evaluation Referral placed today

## 2023-09-15 NOTE — Assessment & Plan Note (Signed)
 Using daily testosterone gel Helping with symptoms Feeling much better We will repeat level today Medication refilled

## 2023-09-15 NOTE — Assessment & Plan Note (Signed)
 Benign physical examination. Most likely related to activities of daily life and perhaps overuse at the gym Pain management discussed Recommend orthopedic evaluation Referral placed today

## 2023-09-15 NOTE — Assessment & Plan Note (Signed)
PSA done today Recommend urology evaluation Referral placed today.

## 2023-09-15 NOTE — Assessment & Plan Note (Signed)
Stable and well-controlled. No recent need for albuterol rescue inhaler.

## 2023-09-15 NOTE — Patient Instructions (Signed)
 Health Maintenance, Male  Adopting a healthy lifestyle and getting preventive care are important in promoting health and wellness. Ask your health care provider about:  The right schedule for you to have regular tests and exams.  Things you can do on your own to prevent diseases and keep yourself healthy.  What should I know about diet, weight, and exercise?  Eat a healthy diet    Eat a diet that includes plenty of vegetables, fruits, low-fat dairy products, and lean protein.  Do not eat a lot of foods that are high in solid fats, added sugars, or sodium.  Maintain a healthy weight  Body mass index (BMI) is a measurement that can be used to identify possible weight problems. It estimates body fat based on height and weight. Your health care provider can help determine your BMI and help you achieve or maintain a healthy weight.  Get regular exercise  Get regular exercise. This is one of the most important things you can do for your health. Most adults should:  Exercise for at least 150 minutes each week. The exercise should increase your heart rate and make you sweat (moderate-intensity exercise).  Do strengthening exercises at least twice a week. This is in addition to the moderate-intensity exercise.  Spend less time sitting. Even light physical activity can be beneficial.  Watch cholesterol and blood lipids  Have your blood tested for lipids and cholesterol at 53 years of age, then have this test every 5 years.  You may need to have your cholesterol levels checked more often if:  Your lipid or cholesterol levels are high.  You are older than 53 years of age.  You are at high risk for heart disease.  What should I know about cancer screening?  Many types of cancers can be detected early and may often be prevented. Depending on your health history and family history, you may need to have cancer screening at various ages. This may include screening for:  Colorectal cancer.  Prostate cancer.  Skin cancer.  Lung  cancer.  What should I know about heart disease, diabetes, and high blood pressure?  Blood pressure and heart disease  High blood pressure causes heart disease and increases the risk of stroke. This is more likely to develop in people who have high blood pressure readings or are overweight.  Talk with your health care provider about your target blood pressure readings.  Have your blood pressure checked:  Every 3-5 years if you are 9-95 years of age.  Every year if you are 85 years old or older.  If you are between the ages of 29 and 29 and are a current or former smoker, ask your health care provider if you should have a one-time screening for abdominal aortic aneurysm (AAA).  Diabetes  Have regular diabetes screenings. This checks your fasting blood sugar level. Have the screening done:  Once every three years after age 23 if you are at a normal weight and have a low risk for diabetes.  More often and at a younger age if you are overweight or have a high risk for diabetes.  What should I know about preventing infection?  Hepatitis B  If you have a higher risk for hepatitis B, you should be screened for this virus. Talk with your health care provider to find out if you are at risk for hepatitis B infection.  Hepatitis C  Blood testing is recommended for:  Everyone born from [birth year] through 1965.  Anyone  with known risk factors for hepatitis C.  Sexually transmitted infections (STIs)  You should be screened each year for STIs, including gonorrhea and chlamydia, if:  You are sexually active and are younger than 53 years of age.  You are older than 53 years of age and your health care provider tells you that you are at risk for this type of infection.  Your sexual activity has changed since you were last screened, and you are at increased risk for chlamydia or gonorrhea. Ask your health care provider if you are at risk.  Ask your health care provider about whether you are at high risk for HIV. Your health care provider  may recommend a prescription medicine to help prevent HIV infection. If you choose to take medicine to prevent HIV, you should first get tested for HIV. You should then be tested every 3 months for as long as you are taking the medicine.  Follow these instructions at home:  Alcohol use  Do not drink alcohol if your health care provider tells you not to drink.  If you drink alcohol:  Limit how much you have to 0-2 drinks a day.  Know how much alcohol is in your drink. In the [place] one drink equals one 12 oz bottle of beer (355 mL), one 5 oz glass of wine (148 mL), or one 1 oz glass of hard liquor (44 mL).  Lifestyle  Do not use any products that contain nicotine or tobacco. These products include cigarettes, chewing tobacco, and vaping devices, such as e-cigarettes. If you need help quitting, ask your health care provider.  Do not use street drugs.  Do not share needles.  Ask your health care provider for help if you need support or information about quitting drugs.  General instructions  Schedule regular health, dental, and eye exams.  Stay current with your vaccines.  Tell your health care provider if:  You often feel depressed.  You have ever been abused or do not feel safe at home.  Summary  Adopting a healthy lifestyle and getting preventive care are important in promoting health and wellness.  Follow your health care provider's instructions about healthy diet, exercising, and getting tested or screened for diseases.  Follow your health care provider's instructions on monitoring your cholesterol and blood pressure.  This information is not intended to replace advice given to you by your health care provider. Make sure you discuss any questions you have with your health care provider.  Document Revised: 09/29/2020 Document Reviewed: 09/29/2020  Elsevier Patient Education  2024 [employer].

## 2023-09-20 ENCOUNTER — Encounter: Payer: Self-pay | Admitting: Emergency Medicine

## 2023-09-21 ENCOUNTER — Ambulatory Visit: Admitting: Orthopaedic Surgery

## 2023-09-21 ENCOUNTER — Other Ambulatory Visit (INDEPENDENT_AMBULATORY_CARE_PROVIDER_SITE_OTHER): Payer: Self-pay

## 2023-09-21 DIAGNOSIS — M25512 Pain in left shoulder: Secondary | ICD-10-CM

## 2023-09-21 DIAGNOSIS — M79641 Pain in right hand: Secondary | ICD-10-CM

## 2023-09-21 DIAGNOSIS — G8929 Other chronic pain: Secondary | ICD-10-CM

## 2023-09-21 NOTE — Progress Notes (Signed)
 Office Visit Note   Patient: Darin Hart           Date of Birth: 08-Feb-1971           MRN: [id number] Visit Date: 09/21/2023              Requested by: Elvira Hammersmith, MD [address] [place],  [place] [address] PCP: Elvira Hammersmith, MD   Assessment & Plan: Visit Diagnoses:  1. Chronic left shoulder pain   2. Pain in right hand     Plan: Assessment and Plan    Left rotator cuff inflammation, tendinosis Chronic inflammation due to repetitive motion and gym exercises. Previous surgery noted. Normal range of motion with pain on testing. No immediate need for cortisone injection. - Advise rest and avoid exacerbating activities for 3-4 weeks. - Continue ibuprofen  800 mg as needed. - Avoid cortisone injection unless pain worsens or persists. 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 - Follow-up if no improvement.  Strain of flexor tendon of right ring finger Strain of ring finger from work incident. Popping sensation noted. No bony abnormalities on x-ray. Likely minor strain with possible microscopic tears.  Would expect to heal. - Advise rest and avoid exacerbating activities. - Recommend anti-inflammatory medications as needed.      Follow-Up Instructions: No follow-ups on file.   Orders:  Orders Placed This Encounter  Procedures   XR Shoulder Left   XR Hand Complete Right   No orders of the defined types were placed in this encounter.    Subjective: Chief Complaint  Patient presents with   Left Shoulder - Pain   Right Hand - Pain    HPI Discussed the use of AI scribe software for clinical note transcription with the patient, who gave verbal consent to proceed.  History of Present Illness   Darin Hart is a 53 year old male who presents with right hand and left shoulder pain.  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 Over the last two  weeks, he experiences pain when wringing out a washcloth, with the hand feeling hot to the touch. There is shooting pain along the finger but no pain along the extensor tendon sheath, and no locking or catching of the finger.  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 He had shoulder surgery many years ago in [place] . The pain radiates up the shoulder and sometimes to the back, especially during rotator cuff exercises and pressing movements. He recently resumed gym activities, lifting light weights (10-20 pounds) for maintenance. He takes ibuprofen  800 mg as prescribed.      Review of Systems  Constitutional: Negative.   HENT: Negative.    Eyes: Negative.   Respiratory: Negative.    Cardiovascular: Negative.   Gastrointestinal: Negative.   Endocrine: Negative.   Genitourinary: Negative.   Skin: Negative.   Allergic/Immunologic: Negative.   Neurological: Negative.   Hematological: Negative.   Psychiatric/Behavioral: Negative.    All other systems reviewed and are negative.    Objective: Vital Signs: There were no vitals taken for this visit.  Physical Exam Vitals and nursing note reviewed.  Constitutional:      Appearance: He is well-developed.  HENT:     Head: Normocephalic and atraumatic.  Eyes:     Pupils: Pupils are  equal, round, and reactive to light.  Pulmonary:     Effort: Pulmonary effort is normal.  Abdominal:     Palpations: Abdomen is soft.  Musculoskeletal:        General: Normal range of motion.     Cervical back: Neck supple.  Skin:    General: Skin is warm.  Neurological:     Mental Status: He is alert and oriented to person, place, and time.  Psychiatric:        Behavior: Behavior normal.        Thought Content: Thought content normal.        Judgment: Judgment normal.     Ortho Exam Physical Exam   MUSCULOSKELETAL: Normal flexion, abduction, external rotation, and internal  rotation in left shoulder.  Mild pain in supraspinatus during resistance test without weakness.  remaining portions of exam unremarkable   Right ring finger exam shows full arc of motion.  FDS and FDP function intact.  Extension function intact.  no swelling or lesions.      Specialty Comments:  No specialty comments available.  Imaging: XR Shoulder Left Result Date: 09/21/2023 X-rays of the left shoulder show no acute or structural abnormalities   XR Hand Complete Right Result Date: 09/21/2023 X-rays of the right hand show no acute or structural abnormalities.    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   History of asthma 10/02/2020   History of multiple allergies 10/02/2020   Low testosterone  02/07/2018   Past Medical History:  Diagnosis Date   Arthritis    knees   Asthma    Biceps rupture, proximal    right   Heart murmur    as child    Family History  Problem Relation Age of Onset   Hypertension Mother    Other Neg Hx        hypogonadism    Past Surgical History:  Procedure Laterality Date   BICEPT TENODESIS Right 09/30/2021   Procedure: RIGHT BICEPS TENODESIS;  Surgeon: Wes Hamman, MD;  Location: [facility];  Service: Orthopedics;  Laterality: Right;   HERNIA REPAIR     ORIF FINGER / THUMB FRACTURE Right    RHINOPLASTY     ROTATOR CUFF REPAIR Right    Spontaneous pneumothorax Left    20 yrs ago   Social History   Occupational History   Not on file  Tobacco Use   Smoking status: Never   Smokeless tobacco: Never  Vaping Use   Vaping status: Never  Used  Substance and Sexual Activity   Alcohol use: Yes    Comment: OCCASIONALLY   Drug use: Never   Sexual activity: Yes    Partners: Female

## 2023-10-27 ENCOUNTER — Ambulatory Visit (INDEPENDENT_AMBULATORY_CARE_PROVIDER_SITE_OTHER): Admitting: Podiatry

## 2023-10-27 DIAGNOSIS — Z91199 Patient's noncompliance with other medical treatment and regimen due to unspecified reason: Secondary | ICD-10-CM

## 2023-10-30 NOTE — Progress Notes (Signed)
Patient did not show for scheduled appointment today.

## 2023-11-04 ENCOUNTER — Encounter: Payer: Self-pay | Admitting: Physician Assistant

## 2023-11-04 ENCOUNTER — Ambulatory Visit: Admitting: Physician Assistant

## 2023-11-04 DIAGNOSIS — M25512 Pain in left shoulder: Secondary | ICD-10-CM

## 2023-11-04 DIAGNOSIS — G8929 Other chronic pain: Secondary | ICD-10-CM

## 2023-11-04 MED ORDER — METHYLPREDNISOLONE ACETATE 40 MG/ML IJ SUSP
40.0000 mg | INTRAMUSCULAR | Status: AC | PRN
Start: 2023-11-04 — End: 2023-11-04
  Administered 2023-11-04: 40 mg via INTRA_ARTICULAR

## 2023-11-04 MED ORDER — LIDOCAINE HCL 1 % IJ SOLN
2.0000 mL | INTRAMUSCULAR | Status: AC | PRN
Start: 2023-11-04 — End: 2023-11-04
  Administered 2023-11-04: 2 mL

## 2023-11-04 MED ORDER — BUPIVACAINE HCL 0.25 % IJ SOLN
2.0000 mL | INTRAMUSCULAR | Status: AC | PRN
Start: 2023-11-04 — End: 2023-11-04
  Administered 2023-11-04: 2 mL via INTRA_ARTICULAR

## 2023-11-04 NOTE — Progress Notes (Signed)
 Office Visit Note   Patient: Darin Hart           Date of Birth: 1970/10/02           MRN: [id number] Visit Date: 11/04/2023              Requested by: Elvira Hammersmith, MD [address] [place],  [place] [address] PCP: Elvira Hammersmith, MD   Assessment & Plan: Visit Diagnoses:  1. Chronic left shoulder pain     Plan: Impression is chronic left shoulder pain likely from underlying rotator cuff tendinitis.  He has great range of motion and strength.  We have discussed proceeding with a subacromial cortisone injection as his pain has persisted.  I have also recommended backing off lifting any weights at the gym for the next several weeks.  Follow-up as needed.  Follow-Up Instructions: Return if symptoms worsen or fail to improve.   Orders:  No orders of the defined types were placed in this encounter.  No orders of the defined types were placed in this encounter.     Procedures: Medium Joint Inj on 11/04/2023 1:20 PM Medications: 2 mL lidocaine  1 %; 2 mL bupivacaine  0.25 %; 40 mg methylPREDNISolone  acetate 40 MG/ML      Clinical Data: No additional findings.   Subjective: Chief Complaint  Patient presents with   Left Shoulder - Pain    HPI patient is a pleasant 53 year old gentleman who comes in today with continued left shoulder pain.  Pain began back in January after starting a new job.  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  His pain has worsened over the past several months.  All this pain is to the proximal deltoid.  Pain is worse when he is lying on the left side as well as when he is doing any overhead activities.  He has tried NSAIDs without significant relief.  He denies any recent cortisone injection of the left shoulder.  He does have a history of a left shoulder rotator cuff repair years ago.  Review of Systems as detailed in HPI.  All others reviewed and are negative.   Objective: Vital Signs:  There were no vitals taken for this visit.  Physical Exam well-developed well-nourished gentleman in no acute distress.  Alert and oriented x 3.  Full active range of motion in all planes.  He has mild pain with resisted internal rotation.  Negative empty can, negative cross body adduction.  Negative speeds and negative O'Brien's testing.  5 out of 5 strength throughout.  He is neurovascularly intact distally.  Ortho Exam  Specialty Comments:  No specialty comments available.  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   History of asthma 10/02/2020   History of multiple allergies 10/02/2020   Low testosterone  02/07/2018   Past Medical History:  Diagnosis Date   Arthritis    knees   Asthma    Biceps rupture, proximal    right   Heart murmur    as child  Family History  Problem Relation Age of Onset   Hypertension Mother    Other Neg Hx        hypogonadism    Past Surgical History:  Procedure Laterality Date   BICEPT TENODESIS Right 09/30/2021   Procedure: RIGHT BICEPS TENODESIS;  Surgeon: Wes Hamman, MD;  Location: [facility];  Service: Orthopedics;  Laterality: Right;   HERNIA REPAIR     ORIF FINGER / THUMB FRACTURE Right    RHINOPLASTY     ROTATOR CUFF REPAIR Right    Spontaneous pneumothorax Left    20 yrs ago   Social History   Occupational History   Not on file  Tobacco Use   Smoking status: Never   Smokeless tobacco: Never  Vaping Use   Vaping status: Never Used  Substance and Sexual Activity   Alcohol use: Yes     Comment: OCCASIONALLY   Drug use: Never   Sexual activity: Yes    Partners: Female

## 2023-11-22 DIAGNOSIS — N403 Nodular prostate with lower urinary tract symptoms: Secondary | ICD-10-CM | POA: Diagnosis not present

## 2023-11-22 DIAGNOSIS — N3943 Post-void dribbling: Secondary | ICD-10-CM | POA: Diagnosis not present

## 2023-11-22 DIAGNOSIS — N401 Enlarged prostate with lower urinary tract symptoms: Secondary | ICD-10-CM | POA: Diagnosis not present

## 2023-11-22 DIAGNOSIS — R351 Nocturia: Secondary | ICD-10-CM | POA: Diagnosis not present

## 2023-12-26 ENCOUNTER — Other Ambulatory Visit: Payer: Self-pay | Admitting: Emergency Medicine

## 2023-12-26 DIAGNOSIS — E291 Testicular hypofunction: Secondary | ICD-10-CM

## 2024-02-03 NOTE — Therapy (Signed)
 OUTPATIENT PHYSICAL THERAPY MALE PELVIC EVALUATION   Patient Name: Darin Hart MRN: [id number] DOB:Mar 26, 1971, 53 y.o., male Today's Date: 02/06/2024  END OF SESSION:  PT End of Session - 02/06/24 0806     Visit Number 1    Date for PT Re-Evaluation 03/05/24    Authorization Type [employer]    PT Start Time 0800    PT Stop Time 0840    PT Time Calculation (min) 40 min    Activity Tolerance Patient tolerated treatment well    Behavior During Therapy [facility] for tasks assessed/performed          Past Medical History:  Diagnosis Date   Arthritis    knees   Asthma    Biceps rupture, proximal    right   Heart murmur    as child   Past Surgical History:  Procedure Laterality Date   BICEPT TENODESIS Right 09/30/2021   Procedure: RIGHT BICEPS TENODESIS;  Surgeon: Jerri Kay HERO, MD;  Location: [facility];  Service: Orthopedics;  Laterality: Right;   HERNIA REPAIR     ORIF FINGER / THUMB FRACTURE Right    RHINOPLASTY     ROTATOR CUFF REPAIR Right    Spontaneous pneumothorax Left    20 yrs ago   Patient Active Problem List   Diagnosis Date Noted   Acute pain of left shoulder 09/15/2023   Hand sprain, right, initial encounter 09/15/2023   Numbness of toes 06/02/2023   Onychomycosis 06/02/2023   Nail problem 10/19/2022   Hypogonadism in male 10/19/2022   Lower urinary tract symptoms (LUTS) 10/19/2022   History of inguinal hernia repair 06/16/2022   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   History of asthma 10/02/2020   History of multiple allergies 10/02/2020   Low testosterone  02/07/2018    PCP: Purcell Emil Schanz, MD  REFERRING PROVIDER: Elisabeth Valli BIRCH, MD   REFERRING DIAG:  [phone] (ICD-10-CM) - Post-void dribbling  R35.1 (ICD-10-CM) - Nocturia  N40.2 (ICD-10-CM) - Prostate nodule  N40.1 (ICD-10-CM) - Benign localized prostatic hyperplasia with lower urinary tract symptoms (LUTS)    THERAPY DIAG:  Cramp and  spasm  Urgency of urination  Rationale for Evaluation and Treatment: Rehabilitation  ONSET DATE: 9/24  SUBJECTIVE:                                                                                                                                                                                           SUBJECTIVE STATEMENT: Patient would get up multiple times during the night. My prostate was a little swollen. Patient will be moving out of [place] on 02/25/24.  Fluid intake: water  PAIN:  Are you having pain? No  PRECAUTIONS: None  RED FLAGS: None   WEIGHT BEARING RESTRICTIONS: No  FALLS:  Has patient fallen in last 6 months? No    OCCUPATION: building maintenance.   PLOF: Independent  PATIENT GOALS: see what could be done  PERTINENT HISTORY:  Hernia repair;   BOWEL MOVEMENT: No issues   URINATION: Pain with urination: No Fully empty bladder: No, still feels urine is still there Stream: Strong Urgency: Yes: has to stop what he is doing to urinate Frequency: wakes up 2 times per night, day  Leakage: none Pads: No  INTERCOURSE: Pain with intercourse: none Climax: yes Ejaculation: Yes:     OBJECTIVE:  Note: Objective measures were completed at Evaluation unless otherwise noted.  DIAGNOSTIC FINDINGS:  none   COGNITION: Overall cognitive status: Within functional limits for tasks assessed     SENSATION: Light touch: Appears intact Proprioception: Appears intact   POSTURE: No Significant postural limitations  PELVIC ALIGNMENT:  LUMBARAROM/PROM:  A/PROM A/PROM  eval  Flexion Decreased by 25%  Extension Decreased by 50%  Right lateral flexion Decreased by 25%  Left lateral flexion Decreased by 25%  Right rotation Decreased by 25%  Left rotation Decreased by 25%   (Blank rows = not tested)  LOWER EXTREMITY AROM/PROM:  P/PROM Right eval Left eval  Hip flexion 120 115  Hip internal rotation 20 20  Hip external rotation 40 20   (Blank  rows = not tested)  LOWER EXTREMITY MMT:  MMT Right eval Left eval  Hip extension 4/5 4/5  Hip abduction 4/5 4/5   PALPATION: GENERAL difficulty with diaphragmatic breathing and opening up the lower rib cage.                Patient confirms identification and approves PT to assess internal pelvic floor and treatment No Patient is not comfortable with therapist assessing his pelvic floor muscles internally.    TODAY'S TREATMENT:                                                                                                                              DATE: 02/06/24  EVAL Examination completed, findings reviewed, pt educated on POC, HEP, and male pelvic floor anatomy, reasoning with pelvic floor assessment internally with pt consent. Pt motivated to participate in PT and agreeable to attempt recommendations.     PATIENT EDUCATION:  02/06/24 Education details: Access Code: [email or web address] Person educated: Patient Education method: Explanation, Demonstration, Tactile cues, Verbal cues, and Handouts Education comprehension: verbalized understanding, returned demonstration, verbal cues required, tactile cues required, and needs further education  HOME EXERCISE PROGRAM: 02/06/24 Access Code: [email or web address] URL: [email or web address] Date: 02/06/2024 Prepared by: Channing Pereyra  Exercises - Supine Diaphragmatic Breathing  - 1 x daily - 7 x weekly - 3 sets - 10 reps - Standing Diaphragmatic Breathing  - 1 x daily - 7 x weekly - 3 sets - 10 reps -  Seated Diaphragmatic Breathing  - 1 x daily - 7 x weekly - 3 sets - 10 reps - Supine Single Knee to Chest Stretch  - 1 x daily - 7 x weekly - 1 sets - 2 reps - 30 sec hold - Supine Figure 4 Piriformis Stretch  - 1 x daily - 7 x weekly - 1 sets - 2 reps - 30 sec hold - Supine Bilateral Hip Internal Rotation Stretch  - 1 x daily - 7 x weekly - 1 sets - 10 reps  For all possible CPT codes, reference the Planned Interventions line above.      Check all conditions that are expected to impact treatment: {Conditions expected to impact treatment:None of these apply   If treatment provided at initial evaluation, no treatment charged due to lack of authorization.       ASSESSMENT:  CLINICAL IMPRESSION: Patient is a 53 y.o. male who was seen today for physical therapy evaluation and treatment for post void dribbling. Patient reports he has to urinate 2 times during the night. He has urinary urgency and has to rush to the bathroom. Patient will dribble urine after urination and does not empty his bladder well. He has decreased lumbar  and left hip ROM.  Patient was not comfortable with therapist performing pelvic floor assessment. He has learned diaphragmatic breathing to help with elongation of the pelvic floor elongation and hip stretches. Patient will benefit from skilled therapy to reduce post void dribbling and urgency.   OBJECTIVE IMPAIRMENTS: decreased coordination, decreased ROM, and decreased strength.   ACTIVITY LIMITATIONS: continence and toileting  PARTICIPATION LIMITATIONS: community activity and occupation  PERSONAL FACTORS: Time since onset of injury/illness/exacerbation are also affecting patient's functional outcome.   REHAB POTENTIAL: Excellent  CLINICAL DECISION MAKING: Stable/uncomplicated  EVALUATION COMPLEXITY: Low   GOALS: Goals reviewed with patient? Yes  SHORT TERM GOALS: Target date: 02/16/24  Patient independent with initial HEP for hip stretches, diaphragmatic breathing.  Baseline:not educated yet Goal status: INITIAL   LONG TERM GOALS: Target date: 03/05/24  Patient independent with advanced HEP for relaxation of the pelvic floor to fully empty his bladder.  Baseline: not educated yet Goal status: INITIAL  2.  Patient reports he is waking up 0-1 times per night due to using his urge suppression drill.  Baseline: wakes up 2 times per night Goal status: INITIAL  3.  Patient is able to  have the urgency to urinate and walk slowly to the bathroom due increased relaxation of the pelvic floor.  Baseline: when has the urge must rush to the bathroom Goal status: INITIAL  4.  Patient is able to fully empty his bladder without post void dribble due to full relaxation of the pelvic floor to empty the bladder.  Baseline: post void dribble after urination Goal status: INITIAL    PLAN:  PT FREQUENCY: 1x/week  PT DURATION: 4 weeks  PLANNED INTERVENTIONS: 97110-Therapeutic exercises, 97530- Therapeutic activity, 97112- Neuromuscular re-education, 97535- Self Care, [id number]- Manual therapy, Joint mobilization, Spinal mobilization, and Biofeedback  PLAN FOR NEXT SESSION: diaphragmatic breathing, hip mobilization, back stretches, urge to void   Channing Pereyra, PT 02/06/24 11:37 AM

## 2024-02-06 ENCOUNTER — Ambulatory Visit: Attending: Urology | Admitting: Physical Therapy

## 2024-02-06 ENCOUNTER — Encounter: Payer: Self-pay | Admitting: Physical Therapy

## 2024-02-06 ENCOUNTER — Other Ambulatory Visit: Payer: Self-pay

## 2024-02-06 DIAGNOSIS — R252 Cramp and spasm: Secondary | ICD-10-CM | POA: Diagnosis not present

## 2024-02-06 DIAGNOSIS — R3915 Urgency of urination: Secondary | ICD-10-CM | POA: Insufficient documentation

## 2024-02-13 ENCOUNTER — Other Ambulatory Visit: Payer: Self-pay | Admitting: Emergency Medicine

## 2024-02-13 DIAGNOSIS — E291 Testicular hypofunction: Secondary | ICD-10-CM

## 2024-02-22 ENCOUNTER — Encounter: Payer: Self-pay | Admitting: Emergency Medicine

## 2024-02-22 ENCOUNTER — Ambulatory Visit: Admitting: Emergency Medicine

## 2024-02-22 VITALS — BP 110/76 | HR 66 | Temp 98.0°F | Ht 72.0 in | Wt 180.5 lb

## 2024-02-22 DIAGNOSIS — G8929 Other chronic pain: Secondary | ICD-10-CM | POA: Diagnosis not present

## 2024-02-22 DIAGNOSIS — M25562 Pain in left knee: Secondary | ICD-10-CM

## 2024-02-22 DIAGNOSIS — M25561 Pain in right knee: Secondary | ICD-10-CM

## 2024-02-22 DIAGNOSIS — E291 Testicular hypofunction: Secondary | ICD-10-CM | POA: Diagnosis not present

## 2024-02-22 MED ORDER — DICLOFENAC SODIUM 75 MG PO TBEC: 75.0000 mg | DELAYED_RELEASE_TABLET | Freq: Two times a day (BID) | ORAL | 2 refills | Status: AC

## 2024-02-22 NOTE — Patient Instructions (Signed)
 Acute Knee Pain, Adult Many things can cause knee pain. Sometimes, knee pain is sudden (acute). It may be caused by damage, swelling, or irritation of the muscles and tissues that support your knee. Pain may come from: A fall. An injury to the knee from twisting motions. A hit to the knee. Infection. The pain often goes away on its own with time and rest. If the pain does not go away, tests may be done to find out what is causing the pain. These may include: Imaging tests, such as an X-ray, MRI, CT scan, or ultrasound. Joint aspiration. In this test, fluid is removed from the knee and checked. Arthroscopy. In this test, a lighted tube is put in the knee and an image is shown on a screen. A biopsy. In this test, a health care provider will remove a small piece of tissue for testing. Follow these instructions at home: If you have a knee sleeve or brace that can be taken off:  Wear the knee sleeve or brace as told by your provider. Take it off only if your provider says that you can. Check the skin around it every day. Tell your provider if you see problems. Loosen the knee sleeve or brace if your toes tingle, are numb, or turn cold and blue. Keep the knee sleeve or brace clean and dry. Bathing If the knee sleeve or brace is not waterproof: Do not let it get wet. Cover it when you take a bath or shower. Use a cover that does not let any water in. Managing pain, stiffness, and swelling  If told, put ice on the area. If you have a knee sleeve or brace that you can take off, remove it as told. Put ice in a plastic bag. Place a towel between your skin and the bag. Leave the ice on for 20 minutes, 2-3 times a day. If your skin turns bright red, take off the ice right away to prevent skin damage. The risk of damage is higher if you cannot feel pain, heat, or cold. Move your toes often to reduce stiffness and swelling. Raise the injured area above the level of your heart while you are sitting  or lying down. Use a pillow to support your foot as needed. If told, use an elastic bandage to put pressure (compression) on your injured knee. This may control swelling, give support, and help with discomfort. Sleep with a pillow under your knee. Activity Rest your knee. Do not do things that cause pain or make pain worse. Do not stand or walk on your injured knee until you're told it's okay. Use crutches as told. Avoid activities where both feet leave the ground at the same time and put stress on the joints. Avoid running, jumping rope, and doing jumping jacks. Work with a physical therapist to make a safe exercise program if told. Physical therapy helps your knee move better and get stronger. Exercise as told. General instructions Take your medicines only as told by your provider. If you are overweight, work with your provider and an expert in healthy eating, called a dietician, to set goals to lose weight. Being overweight can make your knee hurt more. Do not smoke, vape, or use products with nicotine or tobacco in them. If you need help quitting, talk with your provider. Return to normal activities when you are told. Ask what things are safe for you to do. Watch for any changes in your symptoms. Keep all follow-up visits. Your provider will check  your healing and adjust treatments if needed. Contact a health care provider if: The knee pain does not stop. The knee pain changes or gets worse. You have a fever along with knee pain. Your knee is red or feels warm when you touch it. Your knee gives out or locks up. Get help right away if: Your knee swells and the swelling gets worse. You cannot move your knee. You have very bad knee pain that does not get better with medicine. This information is not intended to replace advice given to you by your health care provider. Make sure you discuss any questions you have with your health care provider. Document Revised: 02/10/2023 Document  Reviewed: 07/05/2022 Elsevier Patient Education  2024 [employer].

## 2024-02-22 NOTE — Assessment & Plan Note (Signed)
 Using daily testosterone gel Helping with symptoms Feeling much better We will repeat level today Medication refilled

## 2024-02-22 NOTE — Assessment & Plan Note (Signed)
 Chronic but intensified over the past month or 2 Pain management discussed Diclofenac  works well for him Diclofenac  75 mg twice a day refilled today Recommend orthopedic evaluation Sports medicine referral placed today

## 2024-02-22 NOTE — Progress Notes (Signed)
 Darin Hart 53 y.o.   No chief complaint on file.   HISTORY OF PRESENT ILLNESS: This is a 53 y.o. male complaining of bilateral knee pain for the last month.  Denies injury. Also has history of hypogonadism on testosterone  supplementation.  Requesting blood work done No other complaints or medical concerns today.  Knee Pain      Prior to Admission medications   Medication Sig Start Date End Date Taking? Authorizing Provider  albuterol  (VENTOLIN  HFA) 108 (90 Base) MCG/ACT inhaler Inhale 2 puffs into the lungs every 6 (six) hours as needed for wheezing or shortness of breath. 09/15/23  Yes Beyla Loney Jose, MD  alfuzosin (UROXATRAL) 10 MG 24 hr tablet Take 10 mg by mouth at bedtime. 08/18/23  Yes [provider]  diclofenac  (VOLTAREN ) 75 MG EC tablet Take 1 tablet (75 mg total) by mouth 2 (two) times daily. 11/10/21  Yes Jule Ronal CROME, PA-C  ibuprofen  (ADVIL ) 800 MG tablet TAKE 1 TABLET BY MOUTH EVERY 8 HOURS AS NEEDED 02/13/24  Yes Kaetlyn Noa, Emil Schanz, MD  methocarbamol  (ROBAXIN ) 500 MG tablet Take 500 mg by mouth 3 (three) times daily. 06/07/22  Yes [provider]  methocarbamol  (ROBAXIN -750) 750 MG tablet Take 1 tablet (750 mg total) by mouth 3 (three) times daily as needed for muscle spasms. 10/05/22  Yes Jule Ronal CROME, PA-C  Multiple Vitamin (MULTIVITAMIN WITH MINERALS) TABS tablet Take 1 tablet by mouth daily.   Yes [provider]  ondansetron  (ZOFRAN ) 4 MG tablet Take 1-2 tablets (4-8 mg total) by mouth every 8 (eight) hours as needed for nausea or vomiting. 09/30/21  Yes Jerri Kay HERO, MD  terbinafine  (LAMISIL ) 250 MG tablet Take 1 tablet (250 mg total) by mouth daily. 08/25/23  Yes Semon, Jake L, DPM  Testosterone  1.62 % GEL APPLY ONE APPLICATION ONTO THE SKIN DAILY 02/13/24  Yes Rivers Hamrick, Emil Schanz, MD    Allergies  Allergen Reactions   Shellfish Allergy Anaphylaxis   Other Other (See Comments)    Migraines, itchy tongue, burning  mouth All Tree Nuts   Penicillins     Pt was told he could not take this, his reaction is unknown Has patient had a PCN reaction causing immediate rash, facial/tongue/throat swelling, SOB or lightheadedness with hypotension: Unknown Has patient had a PCN reaction causing severe rash involving mucus membranes or skin necrosis: Unknown Has patient had a PCN reaction that required hospitalization: NO Has patient had a PCN reaction occurring within the last 10 years: NO If all of the above answers are NO, then may proceed with Cephalospo    Patient Active Problem List   Diagnosis Date Noted   Acute pain of left shoulder 09/15/2023   Hand sprain, right, initial encounter 09/15/2023   Numbness of toes 06/02/2023   Onychomycosis 06/02/2023   Nail problem 10/19/2022   Hypogonadism in male 10/19/2022   Lower urinary tract symptoms (LUTS) 10/19/2022   History of inguinal hernia repair 06/16/2022   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   History of asthma 10/02/2020   History of multiple allergies 10/02/2020   Low testosterone  02/07/2018    Past Medical History:  Diagnosis Date   Arthritis    knees   Asthma    Biceps rupture, proximal    right   Heart murmur    as child    Past Surgical History:  Procedure Laterality Date   BICEPT TENODESIS Right 09/30/2021   Procedure: RIGHT BICEPS TENODESIS;  Surgeon: Jerri,  Kay HERO, MD;  Location: [facility];  Service: Orthopedics;  Laterality: Right;   HERNIA REPAIR     ORIF FINGER / THUMB FRACTURE Right    RHINOPLASTY     ROTATOR CUFF REPAIR Right    Spontaneous pneumothorax Left    20 yrs ago    Social History   Socioeconomic History   Marital status: Married    Spouse name: Not on file   Number of children: Not on file   Years of education: Not on file   Highest education level: Not on file  Occupational History   Not on file  Tobacco Use   Smoking status: Never   Smokeless tobacco: Never   Vaping Use   Vaping status: Never Used  Substance and Sexual Activity   Alcohol use: Yes    Comment: OCCASIONALLY   Drug use: Never   Sexual activity: Yes    Partners: Female  Other Topics Concern   Not on file  Social History Narrative   Not on file   Social Drivers of Health   Financial Resource Strain: Low Risk  (12/05/2018)   Overall Financial Resource Strain (CARDIA)    Difficulty of Paying Living Expenses: Not hard at all  Food Insecurity: No Food Insecurity (12/05/2018)   Hunger Vital Sign    Worried About Running Out of Food in the Last Year: Never true    Ran Out of Food in the Last Year: Never true  Transportation Needs: No Transportation Needs (12/05/2018)   PRAPARE - Administrator, Civil Service (Medical): No    Lack of Transportation (Non-Medical): No  Physical Activity: Not on file  Stress: Stress Concern Present (12/05/2018)   [employer] of Occupational Health - Occupational Stress Questionnaire    Feeling of Stress : To some extent  Social Connections: Unknown (12/05/2018)   Social Connection and Isolation Panel    Frequency of Communication with Friends and Family: More than three times a week    Frequency of Social Gatherings with Friends and Family: More than three times a week    Attends Religious Services: Not on file    Active Member of Clubs or Organizations: Not on file    Attends Banker Meetings: Not on file    Marital Status: Married  Intimate Partner Violence: Not At Risk (12/05/2018)   Humiliation, Afraid, Rape, and Kick questionnaire    Fear of Current or Ex-Partner: No    Emotionally Abused: No    Physically Abused: No    Sexually Abused: No    Family History  Problem Relation Age of Onset   Hypertension Mother    Other Neg Hx        hypogonadism     Review of Systems  Constitutional: Negative.  Negative for chills and fever.  HENT: Negative.  Negative for congestion and sore throat.   Respiratory:  Negative.  Negative for cough and shortness of breath.   Cardiovascular: Negative.  Negative for chest pain and palpitations.  Gastrointestinal:  Negative for abdominal pain, nausea and vomiting.  Musculoskeletal:  Positive for joint pain (Bilateral knee pain).  Skin: Negative.  Negative for rash.  Neurological: Negative.  Negative for dizziness and headaches.  All other systems reviewed and are negative.   Today's Vitals   02/22/24 1615  BP: 110/76  Pulse: 66  Temp: 98 F (36.7 C)  TempSrc: Temporal  SpO2: 98%  Weight: 180 lb 8 oz (81.9 kg)  Height: 6' (1.829  m)   Body mass index is 24.48 kg/m.   Physical Exam Vitals reviewed.  Constitutional:      Appearance: Normal appearance.  HENT:     Head: Normocephalic.  Eyes:     Extraocular Movements: Extraocular movements intact.  Cardiovascular:     Rate and Rhythm: Normal rate.  Pulmonary:     Effort: Pulmonary effort is normal.  Musculoskeletal:     Comments: Knees: No swelling or tenderness.  Full range of motion.  Stable in flexion and extension.  No abnormal findings.  Skin:    General: Skin is warm and dry.  Neurological:     Mental Status: He is alert and oriented to person, place, and time.  Psychiatric:        Mood and Affect: Mood normal.        Behavior: Behavior normal.      ASSESSMENT & PLAN: Problem List Items Addressed This Visit       Endocrine   Hypogonadism in male   Using daily testosterone  gel Helping with symptoms Feeling much better We will repeat level today Medication refilled      Relevant Orders   PSA   Comprehensive metabolic panel with GFR   CBC with Differential/Platelet   Magnesium   Testosterone      Other   Chronic pain of both knees - Primary   Chronic but intensified over the past month or 2 Pain management discussed Diclofenac  works well for him Diclofenac  75 mg twice a day refilled today Recommend orthopedic evaluation Sports medicine referral placed today       Relevant Medications   diclofenac  (VOLTAREN ) 75 MG EC tablet   Other Relevant Orders   Ambulatory referral to Sports Medicine   Patient Instructions  Acute Knee Pain, Adult Many things can cause knee pain. Sometimes, knee pain is sudden (acute). It may be caused by damage, swelling, or irritation of the muscles and tissues that support your knee. Pain may come from: A fall. An injury to the knee from twisting motions. A hit to the knee. Infection. The pain often goes away on its own with time and rest. If the pain does not go away, tests may be done to find out what is causing the pain. These may include: Imaging tests, such as an X-ray, MRI, CT scan, or ultrasound. Joint aspiration. In this test, fluid is removed from the knee and checked. Arthroscopy. In this test, a lighted tube is put in the knee and an image is shown on a screen. A biopsy. In this test, a health care provider will remove a small piece of tissue for testing. Follow these instructions at home: If you have a knee sleeve or brace that can be taken off:  Wear the knee sleeve or brace as told by your provider. Take it off only if your provider says that you can. Check the skin around it every day. Tell your provider if you see problems. Loosen the knee sleeve or brace if your toes tingle, are numb, or turn cold and blue. Keep the knee sleeve or brace clean and dry. Bathing If the knee sleeve or brace is not waterproof: Do not let it get wet. Cover it when you take a bath or shower. Use a cover that does not let any water in. Managing pain, stiffness, and swelling  If told, put ice on the area. If you have a knee sleeve or brace that you can take off, remove it as told. Put ice in  a plastic bag. Place a towel between your skin and the bag. Leave the ice on for 20 minutes, 2-3 times a day. If your skin turns bright red, take off the ice right away to prevent skin damage. The risk of damage is higher if you cannot  feel pain, heat, or cold. Move your toes often to reduce stiffness and swelling. Raise the injured area above the level of your heart while you are sitting or lying down. Use a pillow to support your foot as needed. If told, use an elastic bandage to put pressure (compression) on your injured knee. This may control swelling, give support, and help with discomfort. Sleep with a pillow under your knee. Activity Rest your knee. Do not do things that cause pain or make pain worse. Do not stand or walk on your injured knee until you're told it's okay. Use crutches as told. Avoid activities where both feet leave the ground at the same time and put stress on the joints. Avoid running, jumping rope, and doing jumping jacks. Work with a physical therapist to make a safe exercise program if told. Physical therapy helps your knee move better and get stronger. Exercise as told. General instructions Take your medicines only as told by your provider. If you are overweight, work with your provider and an expert in healthy eating, called a dietician, to set goals to lose weight. Being overweight can make your knee hurt more. Do not smoke, vape, or use products with nicotine or tobacco in them. If you need help quitting, talk with your provider. Return to normal activities when you are told. Ask what things are safe for you to do. Watch for any changes in your symptoms. Keep all follow-up visits. Your provider will check your healing and adjust treatments if needed. Contact a health care provider if: The knee pain does not stop. The knee pain changes or gets worse. You have a fever along with knee pain. Your knee is red or feels warm when you touch it. Your knee gives out or locks up. Get help right away if: Your knee swells and the swelling gets worse. You cannot move your knee. You have very bad knee pain that does not get better with medicine. This information is not intended to replace advice given to  you by your health care provider. Make sure you discuss any questions you have with your health care provider. Document Revised: 02/10/2023 Document Reviewed: 07/05/2022 Elsevier Patient Education  2024 Elsevier Inc[employer]    Emil Schaumann, MD [facility]

## 2024-02-23 ENCOUNTER — Ambulatory Visit: Payer: Self-pay | Admitting: Emergency Medicine

## 2024-02-23 LAB — COMPREHENSIVE METABOLIC PANEL WITH GFR
ALT: 39 U/L (ref 0–53)
AST: 33 U/L (ref 0–37)
Albumin: 4.5 g/dL (ref 3.5–5.2)
Alkaline Phosphatase: 61 U/L (ref 39–117)
BUN: 24 mg/dL — ABNORMAL HIGH (ref 6–23)
CO2: 19 meq/L (ref 19–32)
Calcium: 9.1 mg/dL (ref 8.4–10.5)
Chloride: 106 meq/L (ref 96–112)
Creatinine, Ser: 1.4 mg/dL (ref 0.40–1.50)
GFR: 57.42 mL/min — ABNORMAL LOW (ref >60.00)
Glucose, Bld: 81 mg/dL (ref 70–99)
Potassium: 3.9 meq/L (ref 3.5–5.1)
Sodium: 144 meq/L (ref 135–145)
Total Bilirubin: 0.3 mg/dL (ref 0.2–1.2)
Total Protein: 6.9 g/dL (ref 6.0–8.3)

## 2024-02-23 LAB — CBC WITH DIFFERENTIAL/PLATELET
Basophils Absolute: 0 K/uL (ref 0.0–0.1)
Basophils Relative: 0.8 % (ref 0.0–3.0)
Eosinophils Absolute: 0.2 K/uL (ref 0.0–0.7)
Eosinophils Relative: 3.7 % (ref 0.0–5.0)
HCT: 40.4 % (ref 39.0–52.0)
Hemoglobin: 13.6 g/dL (ref 13.0–17.0)
Lymphocytes Relative: 36.7 % (ref 12.0–46.0)
Lymphs Abs: 1.6 K/uL (ref 0.7–4.0)
MCHC: 33.7 g/dL (ref 30.0–36.0)
MCV: 92.2 fl (ref 78.0–100.0)
Monocytes Absolute: 0.5 K/uL (ref 0.1–1.0)
Monocytes Relative: 10.6 % (ref 3.0–12.0)
Neutro Abs: 2.2 K/uL (ref 1.4–7.7)
Neutrophils Relative %: 48.2 % (ref 43.0–77.0)
Platelets: 182 K/uL (ref 150.0–400.0)
RBC: 4.39 Mil/uL (ref 4.22–5.81)
RDW: 13 % (ref 11.5–15.5)
WBC: 4.5 K/uL (ref 4.0–10.5)

## 2024-02-23 LAB — TESTOSTERONE: Testosterone: 276.42 ng/dL — ABNORMAL LOW (ref 300.00–890.00)

## 2024-02-23 LAB — MAGNESIUM: Magnesium: 2 mg/dL (ref 1.5–2.5)

## 2024-02-23 LAB — PSA: PSA: 2.59 ng/mL (ref 0.10–4.00)

## 2024-02-24 NOTE — Progress Notes (Signed)
 Ben Anmol Paschen D.[facility] [address] [place] [address] Phone: [phone]   Assessment and Plan:     1. Chronic pain of both knees (Primary) 2. Patellar tendinitis of both knees 3. Quadriceps tendinitis -Chronic with exacerbation, initial visit - Most consistent with quadriceps tendinitis and patellar tendinitis flared by physical activity - X-rays obtained in clinic.  My interpretation: No acute fracture or dislocation.  Superior patellar enthesophyte on left knee - Start meloxicam 15 mg daily x2 weeks.  If still having pain after 2 weeks, complete 3rd-week of NSAID. May use remaining NSAID as needed once daily for pain control.  Do not to use additional over-the-counter NSAIDs (ibuprofen , naproxen , Advil , Aleve , etc.) while taking prescription NSAIDs.  May use Tylenol  [phone] mg 2 to 3 times a day for breakthrough pain. -Start HEP for patellar and quadriceps tendinitis - Recommend using patellar knee strap when physically active  15 additional minutes spent for educating Therapeutic Home Exercise Program.  This included exercises focusing on stretching, strengthening, with focus on eccentric aspects.   Long term goals include an improvement in range of motion, strength, endurance as well as avoiding reinjury. Patient's frequency would include in 1-2 times a day, 3-5 times a week for a duration of 6-12 weeks. Proper technique shown and discussed handout in great detail with ATC.  All questions were discussed and answered.      Pertinent previous records reviewed include none   Follow Up: As needed.  Patient is moving next week, so if pain continues, recommend establishing care locally to discuss physical therapy versus advanced imaging   Subjective:   I, Darin Hart, am serving as a Neurosurgeon for Doctor Morene Mace  Chief Complaint: bilat knee pain   HPI:   02/27/2024 Patient is a 53 year old male with bilat knee pain. Patient  states pain started a month ago. States he started to incorporate jogging into his work out. Decreased ROM. Endorses swelling. The pain feels different from his usual after workout soreness. Heat, ice, tylenol  and ibu don't seem to help. No numbness or tingling, no radiating pain. Standing and sitting is when he feels the pain. Notes swelling has calmed down but pain is still present    Relevant Historical Information: None pertinent  Additional pertinent review of systems negative.   Current Outpatient Medications:    meloxicam (MOBIC) 15 MG tablet, Take 1 tablet daily for 2 weeks.  If still in pain after 2 weeks, take 1 tablet daily for an additional 1 week., Disp: 30 tablet, Rfl: 0   albuterol  (VENTOLIN  HFA) 108 (90 Base) MCG/ACT inhaler, Inhale 2 puffs into the lungs every 6 (six) hours as needed for wheezing or shortness of breath., Disp: 18 g, Rfl: 3   alfuzosin (UROXATRAL) 10 MG 24 hr tablet, Take 10 mg by mouth at bedtime., Disp: , Rfl:    diclofenac  (VOLTAREN ) 75 MG EC tablet, Take 1 tablet (75 mg total) by mouth 2 (two) times daily., Disp: 30 tablet, Rfl: 2   ibuprofen  (ADVIL ) 800 MG tablet, TAKE 1 TABLET BY MOUTH EVERY 8 HOURS AS NEEDED, Disp: 30 tablet, Rfl: 0   methocarbamol  (ROBAXIN ) 500 MG tablet, Take 500 mg by mouth 3 (three) times daily., Disp: , Rfl:    methocarbamol  (ROBAXIN -750) 750 MG tablet, Take 1 tablet (750 mg total) by mouth 3 (three) times daily as needed for muscle spasms., Disp: 30 tablet, Rfl: 2   Multiple Vitamin (MULTIVITAMIN WITH MINERALS) TABS tablet,  Take 1 tablet by mouth daily., Disp: , Rfl:    ondansetron  (ZOFRAN ) 4 MG tablet, Take 1-2 tablets (4-8 mg total) by mouth every 8 (eight) hours as needed for nausea or vomiting., Disp: 20 tablet, Rfl: 0   terbinafine  (LAMISIL ) 250 MG tablet, Take 1 tablet (250 mg total) by mouth daily., Disp: 90 tablet, Rfl: 1   Testosterone  1.62 % GEL, APPLY ONE APPLICATION ONTO THE SKIN DAILY, Disp: 75 g, Rfl: 0   Objective:      Vitals:   02/27/24 1458  BP: 126/86  Pulse: 71  SpO2: 97%  Weight: 179 lb (81.2 kg)  Height: 6' (1.829 m)      Body mass index is 24.28 kg/m.    Physical Exam:    General:  awake, alert oriented, no acute distress nontoxic Skin: no suspicious lesions or rashes Neuro:sensation intact and strength 5/5 with no deficits, no atrophy, normal muscle tone Psych: No signs of anxiety, depression or other mood disorder  Bilateral knee: No swelling No deformity Neg fluid wave, joint milking ROM Flex 110, Ext 0 TTP quad tendon, patellar tendon NTTP over the  medial fem condyle, lat fem condyle, patella, plica,   tibial tuberostiy, fibular head, posterior fossa, pes anserine bursa, gerdy's tubercle, medial jt line, lateral jt line Neg anterior and posterior drawer Neg lachman Neg sag sign Negative varus stress Negative valgus stress Negative McMurray Negative Thessaly  Anterior knee pain with double leg squat  Gait normal    Electronically signed by:  Odis Mace D.[facility] 4:31 PM 02/27/24

## 2024-02-27 ENCOUNTER — Ambulatory Visit (INDEPENDENT_AMBULATORY_CARE_PROVIDER_SITE_OTHER)

## 2024-02-27 ENCOUNTER — Ambulatory Visit: Admitting: Sports Medicine

## 2024-02-27 VITALS — BP 126/86 | HR 71 | Ht 72.0 in | Wt 179.0 lb

## 2024-02-27 DIAGNOSIS — M25562 Pain in left knee: Secondary | ICD-10-CM | POA: Diagnosis not present

## 2024-02-27 DIAGNOSIS — M76899 Other specified enthesopathies of unspecified lower limb, excluding foot: Secondary | ICD-10-CM

## 2024-02-27 DIAGNOSIS — M7651 Patellar tendinitis, right knee: Secondary | ICD-10-CM | POA: Diagnosis not present

## 2024-02-27 DIAGNOSIS — M25561 Pain in right knee: Secondary | ICD-10-CM

## 2024-02-27 DIAGNOSIS — M7652 Patellar tendinitis, left knee: Secondary | ICD-10-CM | POA: Diagnosis not present

## 2024-02-27 DIAGNOSIS — G8929 Other chronic pain: Secondary | ICD-10-CM | POA: Diagnosis not present

## 2024-02-27 MED ORDER — MELOXICAM 15 MG PO TABS: ORAL_TABLET | ORAL | 0 refills | Status: DC

## 2024-02-27 NOTE — Patient Instructions (Signed)
-   Start meloxicam 15 mg daily x2 weeks.  If still having pain after 2 weeks, complete 3rd-week of NSAID. May use remaining NSAID as needed once daily for pain control.  Do not to use additional over-the-counter NSAIDs (ibuprofen , naproxen , Advil , Aleve , etc.) while taking prescription NSAIDs.  May use Tylenol  [phone] mg 2 to 3 times a day for breakthrough pain.  Knee HEP   Recommend getting patellar knee straps to use when physically active   If no improvement re-establish finding new provider when you move to discuss imaging or PT

## 2024-03-26 ENCOUNTER — Encounter: Payer: Self-pay | Admitting: Radiology

## 2024-03-29 ENCOUNTER — Encounter: Payer: Self-pay | Admitting: Emergency Medicine

## 2024-04-02 ENCOUNTER — Other Ambulatory Visit: Payer: Self-pay | Admitting: Emergency Medicine

## 2024-04-02 DIAGNOSIS — E291 Testicular hypofunction: Secondary | ICD-10-CM

## 2024-04-02 MED ORDER — XYOSTED 75 MG/0.5ML ~~LOC~~ SOAJ: 75.0000 mg | SUBCUTANEOUS | 3 refills | Status: DC

## 2024-04-02 NOTE — Telephone Encounter (Signed)
  Testosterone  prescription sent to pharmacy of record today.

## 2024-04-08 ENCOUNTER — Other Ambulatory Visit: Payer: Self-pay | Admitting: Sports Medicine

## 2024-04-17 ENCOUNTER — Other Ambulatory Visit: Payer: Self-pay | Admitting: Sports Medicine

## 2024-04-24 ENCOUNTER — Telehealth: Payer: Self-pay | Admitting: Emergency Medicine

## 2024-04-24 DIAGNOSIS — E291 Testicular hypofunction: Secondary | ICD-10-CM

## 2024-04-24 NOTE — Telephone Encounter (Unsigned)
 Copied from CRM [id number]. Topic: Clinical - Medication Refill >> Apr 24, 2024  4:31 PM China J wrote: Medication: ibuprofen  (ADVIL ) 800 MG tablet Testosterone  Enanthate (XYOSTED ) 75 MG/0.5ML SOAJ meloxicam  (MOBIC ) 15 MG tablet terbinafine  (LAMISIL ) 250 MG tablet  Has the patient contacted their pharmacy? Yes (Agent: If no, request that the patient contact the pharmacy for the refill. If patient does not wish to contact the pharmacy document the reason why and proceed with request.) (Agent: If yes, when and what did the pharmacy advise?) New pharmacy needing scripts sent over.   This is the patient's preferred pharmacy:  [facility] [address], [place] - [address] [place] [address] Phone: [phone] Fax: [phone]  Is this the correct pharmacy for this prescription? Yes If no, delete pharmacy and type the correct one.   Has the prescription been filled recently? No  Is the patient out of the medication? Yes  Has the patient been seen for an appointment in the last year OR does the patient have an upcoming appointment? Yes  Can we respond through MyChart? Yes  Agent: Please be advised that Rx refills may take up to 3 business days. We ask that you follow-up with your pharmacy.

## 2024-04-25 MED ORDER — XYOSTED 75 MG/0.5ML ~~LOC~~ SOAJ: 75.0000 mg | SUBCUTANEOUS | 3 refills | Status: AC

## 2024-04-25 MED ORDER — TERBINAFINE HCL 250 MG PO TABS: 250.0000 mg | ORAL_TABLET | Freq: Every day | ORAL | 1 refills | Status: AC

## 2024-04-25 MED ORDER — IBUPROFEN 800 MG PO TABS: 800.0000 mg | ORAL_TABLET | Freq: Three times a day (TID) | ORAL | 0 refills | Status: AC | PRN

## 2024-04-25 MED ORDER — MELOXICAM 15 MG PO TABS: ORAL_TABLET | ORAL | 0 refills | Status: AC

## 2024-05-31 ENCOUNTER — Other Ambulatory Visit: Payer: Self-pay | Admitting: Emergency Medicine

## 2024-05-31 DIAGNOSIS — E291 Testicular hypofunction: Secondary | ICD-10-CM

## 2024-06-01 ENCOUNTER — Ambulatory Visit: Payer: Self-pay

## 2024-06-01 NOTE — Telephone Encounter (Signed)
 FYI Only or Action Required?: FYI only for provider: appointment scheduled on 06/05/2024.  Patient was last seen in primary care on 02/22/2024 by Purcell Emil Schanz, MD.  Called Nurse Triage reporting Elbow Pain.  Symptoms began about a month ago.  Interventions attempted: Nothing.  Symptoms are: unchanged.  Triage Disposition: See PCP Within 2 Weeks  Patient/caregiver understands and will follow disposition?:   Copied from CRM [id number]. Topic: Clinical - Red Word Triage >> Jun 01, 2024  9:35 AM Willma SAUNDERS wrote: Red Word that prompted transfer to Nurse Triage: Patient thinks he has tendonitis in his right elbow, painful to touch and a swollen. Reason for Disposition  [1] MILD pain (e.g., does not interfere with normal activities) AND [2] present > 7 days  Answer Assessment - Initial Assessment Questions 1. ONSET: When did the pain start?     1 month ago 2. LOCATION: Where is the pain located?     right 3. PAIN: How bad is the pain? (Scale 1-10; or mild, moderate, severe)     9 when working. 6.5 if he is not doing anything. 4. WORK OR EXERCISE: Has there been any recent work or exercise that involved this part of the body?     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 OTHER SYMPTOMS: Do you have any other symptoms? (e.g., neck pain, elbow swelling, rash, fever)     Patient denies swelling. Says it is real tender. Hurts his knuckles to make a first.  Protocols used: Elbow Pain-A-AH

## 2024-06-05 ENCOUNTER — Encounter: Admitting: Emergency Medicine

## 2024-06-05 DIAGNOSIS — N1831 Chronic kidney disease, stage 3a: Secondary | ICD-10-CM | POA: Insufficient documentation

## 2024-06-05 DIAGNOSIS — E291 Testicular hypofunction: Secondary | ICD-10-CM

## 2024-06-05 DIAGNOSIS — Z0001 Encounter for general adult medical examination with abnormal findings: Secondary | ICD-10-CM

## 2024-06-05 NOTE — Assessment & Plan Note (Signed)
Stable and well-controlled. No recent need for albuterol rescue inhaler.
